# Patient Record
Sex: Female | Born: 1945 | Race: White | Hispanic: No | Marital: Married | Smoking: Never smoker
Health system: Southern US, Community
[De-identification: ages and names within clinical notes are randomized; demographics above are authoritative.]

## PROBLEM LIST (undated history)

## (undated) DIAGNOSIS — K219 Gastro-esophageal reflux disease without esophagitis: Secondary | ICD-10-CM

## (undated) DIAGNOSIS — C439 Malignant melanoma of skin, unspecified: Secondary | ICD-10-CM

## (undated) DIAGNOSIS — I10 Essential (primary) hypertension: Secondary | ICD-10-CM

## (undated) DIAGNOSIS — C801 Malignant (primary) neoplasm, unspecified: Secondary | ICD-10-CM

## (undated) DIAGNOSIS — J45909 Unspecified asthma, uncomplicated: Secondary | ICD-10-CM

## (undated) DIAGNOSIS — T7840XA Allergy, unspecified, initial encounter: Secondary | ICD-10-CM

## (undated) DIAGNOSIS — C50919 Malignant neoplasm of unspecified site of unspecified female breast: Secondary | ICD-10-CM

## (undated) DIAGNOSIS — I8390 Asymptomatic varicose veins of unspecified lower extremity: Secondary | ICD-10-CM

## (undated) DIAGNOSIS — E079 Disorder of thyroid, unspecified: Secondary | ICD-10-CM

## (undated) DIAGNOSIS — E119 Type 2 diabetes mellitus without complications: Secondary | ICD-10-CM

## (undated) HISTORY — PX: OTHER SURGICAL HISTORY: SHX169

## (undated) HISTORY — DX: Unspecified asthma, uncomplicated: J45.909

## (undated) HISTORY — PX: BIOPSY THYROID: PRO38

## (undated) HISTORY — DX: Allergy, unspecified, initial encounter: T78.40XA

## (undated) HISTORY — PX: MASTECTOMY: SHX3

## (undated) HISTORY — DX: Disorder of thyroid, unspecified: E07.9

## (undated) HISTORY — PX: TONSILLECTOMY: SUR1361

## (undated) HISTORY — PX: SKIN CANCER EXCISION: SHX779

## (undated) HISTORY — PX: MEDIASTINAL MASS EXCISION: SHX2025

## (undated) HISTORY — DX: Essential (primary) hypertension: I10

## (undated) HISTORY — DX: Gastro-esophageal reflux disease without esophagitis: K21.9

## (undated) HISTORY — PX: KNEE ARTHROSCOPY WITH MENISCAL REPAIR: SHX5653

## (undated) HISTORY — DX: Type 2 diabetes mellitus without complications: E11.9

## (undated) HISTORY — DX: Asymptomatic varicose veins of unspecified lower extremity: I83.90

## (undated) HISTORY — PX: BREAST LUMPECTOMY: SHX2

---

## 1974-09-20 DIAGNOSIS — C439 Malignant melanoma of skin, unspecified: Secondary | ICD-10-CM

## 1974-09-20 HISTORY — DX: Malignant melanoma of skin, unspecified: C43.9

## 2004-08-12 ENCOUNTER — Ambulatory Visit: Payer: Self-pay | Admitting: Family Medicine

## 2004-09-20 HISTORY — PX: COLONOSCOPY: SHX5424

## 2010-03-31 ENCOUNTER — Ambulatory Visit: Payer: Self-pay | Admitting: Family Medicine

## 2010-07-02 ENCOUNTER — Ambulatory Visit: Payer: Self-pay | Admitting: Family Medicine

## 2012-06-13 DIAGNOSIS — J383 Other diseases of vocal cords: Secondary | ICD-10-CM | POA: Insufficient documentation

## 2013-02-09 DIAGNOSIS — I890 Lymphedema, not elsewhere classified: Secondary | ICD-10-CM | POA: Insufficient documentation

## 2013-02-09 DIAGNOSIS — C50919 Malignant neoplasm of unspecified site of unspecified female breast: Secondary | ICD-10-CM | POA: Insufficient documentation

## 2013-03-01 DIAGNOSIS — R1110 Vomiting, unspecified: Secondary | ICD-10-CM | POA: Insufficient documentation

## 2013-03-01 DIAGNOSIS — E039 Hypothyroidism, unspecified: Secondary | ICD-10-CM | POA: Insufficient documentation

## 2013-03-01 DIAGNOSIS — R490 Dysphonia: Secondary | ICD-10-CM | POA: Insufficient documentation

## 2013-06-04 DIAGNOSIS — I10 Essential (primary) hypertension: Secondary | ICD-10-CM | POA: Insufficient documentation

## 2013-12-07 DIAGNOSIS — M898X9 Other specified disorders of bone, unspecified site: Secondary | ICD-10-CM | POA: Insufficient documentation

## 2014-09-23 DIAGNOSIS — K224 Dyskinesia of esophagus: Secondary | ICD-10-CM | POA: Insufficient documentation

## 2014-10-31 DIAGNOSIS — R05 Cough: Secondary | ICD-10-CM | POA: Insufficient documentation

## 2014-10-31 DIAGNOSIS — R053 Chronic cough: Secondary | ICD-10-CM | POA: Insufficient documentation

## 2014-12-30 DIAGNOSIS — J9859 Other diseases of mediastinum, not elsewhere classified: Secondary | ICD-10-CM | POA: Insufficient documentation

## 2015-04-10 ENCOUNTER — Other Ambulatory Visit: Payer: Self-pay | Admitting: Family Medicine

## 2015-04-12 ENCOUNTER — Other Ambulatory Visit: Payer: Self-pay | Admitting: Family Medicine

## 2015-04-12 DIAGNOSIS — E039 Hypothyroidism, unspecified: Secondary | ICD-10-CM

## 2015-05-04 ENCOUNTER — Other Ambulatory Visit: Payer: Self-pay | Admitting: Family Medicine

## 2015-05-04 DIAGNOSIS — E039 Hypothyroidism, unspecified: Secondary | ICD-10-CM

## 2015-05-29 DIAGNOSIS — J45991 Cough variant asthma: Secondary | ICD-10-CM | POA: Insufficient documentation

## 2015-07-02 ENCOUNTER — Other Ambulatory Visit: Payer: Self-pay | Admitting: Family Medicine

## 2015-07-02 DIAGNOSIS — E039 Hypothyroidism, unspecified: Secondary | ICD-10-CM

## 2015-07-03 ENCOUNTER — Other Ambulatory Visit: Payer: Self-pay | Admitting: Family Medicine

## 2015-07-30 ENCOUNTER — Other Ambulatory Visit: Payer: Self-pay | Admitting: Family Medicine

## 2015-09-28 ENCOUNTER — Other Ambulatory Visit: Payer: Self-pay | Admitting: Family Medicine

## 2015-10-05 ENCOUNTER — Other Ambulatory Visit: Payer: Self-pay | Admitting: Family Medicine

## 2015-11-01 ENCOUNTER — Other Ambulatory Visit: Payer: Self-pay | Admitting: Family Medicine

## 2015-11-06 ENCOUNTER — Ambulatory Visit (INDEPENDENT_AMBULATORY_CARE_PROVIDER_SITE_OTHER): Payer: Medicare Other | Admitting: Family Medicine

## 2015-11-06 ENCOUNTER — Encounter: Payer: Self-pay | Admitting: Family Medicine

## 2015-11-06 VITALS — BP 120/80 | HR 72 | Ht 63.0 in | Wt 214.0 lb

## 2015-11-06 DIAGNOSIS — R05 Cough: Secondary | ICD-10-CM | POA: Diagnosis not present

## 2015-11-06 DIAGNOSIS — I10 Essential (primary) hypertension: Secondary | ICD-10-CM

## 2015-11-06 DIAGNOSIS — R053 Chronic cough: Secondary | ICD-10-CM

## 2015-11-06 DIAGNOSIS — Z23 Encounter for immunization: Secondary | ICD-10-CM | POA: Diagnosis not present

## 2015-11-06 DIAGNOSIS — K219 Gastro-esophageal reflux disease without esophagitis: Secondary | ICD-10-CM | POA: Diagnosis not present

## 2015-11-06 DIAGNOSIS — E039 Hypothyroidism, unspecified: Secondary | ICD-10-CM

## 2015-11-06 MED ORDER — HYDROCHLOROTHIAZIDE 25 MG PO TABS: 25.0000 mg | ORAL_TABLET | Freq: Every day | ORAL | Status: DC

## 2015-11-06 MED ORDER — RANITIDINE HCL 150 MG PO TABS: 150.0000 mg | ORAL_TABLET | Freq: Two times a day (BID) | ORAL | Status: DC

## 2015-11-06 MED ORDER — LEVOTHYROXINE SODIUM 50 MCG PO TABS: 50.0000 ug | ORAL_TABLET | Freq: Every day | ORAL | Status: DC

## 2015-11-06 MED ORDER — BENZONATATE 100 MG PO CAPS: 100.0000 mg | ORAL_CAPSULE | Freq: Two times a day (BID) | ORAL | Status: DC

## 2015-11-06 NOTE — Progress Notes (Signed)
Name: Linda Vazquez   MRN: [id number]    DOB: [birth year]   Date:11/06/2015       Progress Note  Subjective  Chief Complaint  No chief complaint on file.   Gastroesophageal Reflux She complains of choking. She reports no abdominal pain, no belching, no chest pain, no coughing, no dysphagia, no early satiety, no globus sensation, no heartburn, no hoarse voice, no nausea, no sore throat, no stridor, no tooth decay, no water brash or no wheezing. This is a chronic problem. The current episode started more than 1 year ago. The problem occurs frequently. The problem has been waxing and waning. The symptoms are aggravated by certain foods. Pertinent negatives include no anemia, fatigue, melena, muscle weakness, orthopnea or weight loss. Risk factors include hiatal hernia. She has tried a PPI for the symptoms. The treatment provided mild relief. Past procedures include an EGD and esophageal manometry. Past invasive treatments do not include gastroplasty or reflux surgery.  Thyroid Problem Presents for follow-up visit. Patient reports no anxiety, cold intolerance, constipation, depressed mood, diaphoresis, diarrhea, dry skin, fatigue, hair loss, heat intolerance, hoarse voice, leg swelling, menstrual problem, nail problem, palpitations, tremors, visual change, weight gain or weight loss. The symptoms have been stable. Past treatments include levothyroxine. There is no history of atrial fibrillation, dementia, diabetes or heart failure.  Hypertension This is a chronic problem. The current episode started more than 1 year ago. The problem has been waxing and waning since onset. The problem is controlled. Pertinent negatives include no anxiety, blurred vision, chest pain, headaches, malaise/fatigue, neck pain, orthopnea, palpitations, peripheral edema, PND, shortness of breath or sweats. There are no associated agents to hypertension. The current treatment provides mild improvement. There are no compliance  problems.  Hypertensive end-organ damage includes a thyroid problem. There is no history of angina, kidney disease, CAD/MI, CVA, heart failure, left ventricular hypertrophy, PVD or retinopathy.    No problem-specific assessment & plan notes found for this encounter.   No past medical history on file.  Past Surgical History  Procedure Laterality Date  . Tonsillectomy    . Breast lumpectomy Right   . Mastectomy Left   . Mastectomy Right     2 yrs later  . Skin cancer excision      melanoma- stripped nodes on L) side  . Tubal cauterization    . Knee arthroscopy with meniscal repair Bilateral   . Mediastinal mass excision    . Biopsy thyroid      benign  . Colonoscopy  2006    repeat in 10 yrs- [facility]    No family history on file.  Social History   Social History  . Marital Status: Married    Spouse Name: N/A  . Number of Children: N/A  . Years of Education: N/A   Occupational History  . Not on file.   Social History Main Topics  . Smoking status: Never Smoker   . Smokeless tobacco: Not on file  . Alcohol Use: 0.0 oz/week    0 Standard drinks or equivalent per week  . Drug Use: No  . Sexual Activity: Not on file   Other Topics Concern  . Not on file   Social History Narrative  . No narrative on file    Allergies  Allergen Reactions  . Ace Inhibitors Cough    Per pt bp med caused cough possible ace inhibitor  . Sulfa Antibiotics Other (See Comments)    Other Reaction: Not Assessed  Review of Systems  Constitutional: Negative for fever, chills, weight loss, weight gain, malaise/fatigue, diaphoresis and fatigue.  HENT: Negative for ear discharge, ear pain, hoarse voice and sore throat.   Eyes: Negative for blurred vision.  Respiratory: Positive for choking. Negative for cough, sputum production, shortness of breath and wheezing.   Cardiovascular: Negative for chest pain, palpitations, orthopnea, leg swelling and PND.  Gastrointestinal: Negative for  heartburn, dysphagia, nausea, abdominal pain, diarrhea, constipation, blood in stool and melena.  Genitourinary: Negative for dysuria, urgency, frequency, hematuria and menstrual problem.  Musculoskeletal: Negative for myalgias, back pain, joint pain, muscle weakness and neck pain.  Skin: Negative for rash.  Neurological: Negative for dizziness, tingling, tremors, sensory change, focal weakness and headaches.  Endo/Heme/Allergies: Negative for environmental allergies, cold intolerance, heat intolerance and polydipsia. Does not bruise/bleed easily.  Psychiatric/Behavioral: Negative for depression and suicidal ideas. The patient is not nervous/anxious and does not have insomnia.      Objective  Filed Vitals:   11/06/15 0946  BP: 120/80  Pulse: 72  Height: 5\' 3"  (1.6 m)  Weight: 214 lb (97.07 kg)    Physical Exam  Constitutional: She is well-developed, well-nourished, and in no distress. No distress.  HENT:  Head: Normocephalic and atraumatic.  Right Ear: External ear normal.  Left Ear: External ear normal.  Nose: Nose normal.  Mouth/Throat: Oropharynx is clear and moist.  Eyes: Conjunctivae and EOM are normal. Pupils are equal, round, and reactive to light. Right eye exhibits no discharge. Left eye exhibits no discharge.  Neck: Normal range of motion. Neck supple. No JVD present. No thyromegaly present.  Cardiovascular: Normal rate, regular rhythm, normal heart sounds and intact distal pulses.  Exam reveals no gallop and no friction rub.   No murmur heard. Pulmonary/Chest: Effort normal and breath sounds normal.  Abdominal: Soft. Bowel sounds are normal. She exhibits no mass. There is no tenderness. There is no guarding.  Musculoskeletal: Normal range of motion. She exhibits no edema.  Lymphadenopathy:    She has no cervical adenopathy.  Neurological: She is alert. She has normal reflexes.  Skin: Skin is warm and dry. She is not diaphoretic.  Psychiatric: Mood and affect normal.   Nursing note and vitals reviewed.     Assessment & Plan  Problem List Items Addressed This Visit      Cardiovascular and Mediastinum   BP (high blood pressure) - Primary   Relevant Medications   aspirin EC 81 MG tablet   hydrochlorothiazide (HYDRODIURIL) 25 MG tablet   Other Relevant Orders   Renal Function Panel   Lipid Profile     Endocrine   Adult hypothyroidism   Relevant Medications   levothyroxine (SYNTHROID, LEVOTHROID) 50 MCG tablet   Other Relevant Orders   TSH     Other   Chronic cough   Relevant Medications   benzonatate (TESSALON) 100 MG capsule    Other Visit Diagnoses    Gastroesophageal reflux disease, esophagitis presence not specified        Relevant Medications    ranitidine (ZANTAC) 150 MG tablet    Need for Tdap vaccination        Relevant Orders    Tdap vaccine greater than or equal to 7yo IM (Completed)         Dr. [facility]  11/06/2015

## 2015-11-07 LAB — RENAL FUNCTION PANEL
Albumin: 4.4 g/dL (ref 3.5–4.8)
BUN/Creatinine Ratio: 21 (ref 11–26)
BUN: 23 mg/dL (ref 8–27)
CO2: 26 mmol/L (ref 18–29)
Calcium: 10 mg/dL (ref 8.7–10.3)
Chloride: 96 mmol/L (ref 96–106)
Creatinine, Ser: 1.12 mg/dL — ABNORMAL HIGH (ref 0.57–1.00)
GFR calc Af Amer: 57 mL/min/{1.73_m2} — ABNORMAL LOW (ref >59)
GFR calc non Af Amer: 50 mL/min/{1.73_m2} — ABNORMAL LOW (ref >59)
Glucose: 116 mg/dL — ABNORMAL HIGH (ref 65–99)
Phosphorus: 4.1 mg/dL (ref 2.5–4.5)
Potassium: 4.9 mmol/L (ref 3.5–5.2)
Sodium: 142 mmol/L (ref 134–144)

## 2015-11-07 LAB — LIPID PANEL
Chol/HDL Ratio: 2.5 ratio units (ref 0.0–4.4)
Cholesterol, Total: 211 mg/dL — ABNORMAL HIGH (ref 100–199)
HDL: 83 mg/dL (ref >39)
LDL Calculated: 112 mg/dL — ABNORMAL HIGH (ref 0–99)
Triglycerides: 80 mg/dL (ref 0–149)
VLDL Cholesterol Cal: 16 mg/dL (ref 5–40)

## 2015-11-07 LAB — TSH: TSH: 4.19 u[IU]/mL (ref 0.450–4.500)

## 2015-11-10 ENCOUNTER — Ambulatory Visit (INDEPENDENT_AMBULATORY_CARE_PROVIDER_SITE_OTHER): Payer: Medicare Other | Admitting: Family Medicine

## 2015-11-10 ENCOUNTER — Encounter: Payer: Self-pay | Admitting: Family Medicine

## 2015-11-10 VITALS — BP 130/74 | HR 80 | Ht 63.0 in | Wt 217.0 lb

## 2015-11-10 DIAGNOSIS — Z13820 Encounter for screening for osteoporosis: Secondary | ICD-10-CM | POA: Diagnosis not present

## 2015-11-10 DIAGNOSIS — E2839 Other primary ovarian failure: Secondary | ICD-10-CM

## 2015-11-10 DIAGNOSIS — Z0000 Encounter for general adult medical examination without abnormal findings: Secondary | ICD-10-CM

## 2015-11-10 DIAGNOSIS — Z1211 Encounter for screening for malignant neoplasm of colon: Secondary | ICD-10-CM

## 2015-11-10 LAB — HEMOCCULT GUIAC POC 1CARD (OFFICE): Fecal Occult Blood, POC: NEGATIVE

## 2015-11-10 NOTE — Patient Instructions (Signed)

## 2015-11-10 NOTE — Progress Notes (Signed)
Name: Linda Vazquez   MRN: [id number]    DOB: 08/22/1946   Date:11/10/2015       Progress Note  Subjective  Chief Complaint  Chief Complaint  Patient presents with  . Annual Exam    no pap    HPI Comments: Patient presents for annual physical exam.   No problem-specific assessment & plan notes found for this encounter.   Past Medical History  Diagnosis Date  . GERD (gastroesophageal reflux disease)   . Thyroid disease   . Hypertension   . Allergy     Past Surgical History  Procedure Laterality Date  . Tonsillectomy    . Breast lumpectomy Right   . Mastectomy Left   . Mastectomy Right     2 yrs later  . Skin cancer excision      melanoma- stripped nodes on L) side  . Tubal cauterization    . Knee arthroscopy with meniscal repair Bilateral   . Mediastinal mass excision    . Biopsy thyroid      benign  . Colonoscopy  2006    repeat in 10 yrs- [facility]    History reviewed. No pertinent family history.  Social History   Social History  . Marital Status: Married    Spouse Name: N/A  . Number of Children: N/A  . Years of Education: N/A   Occupational History  . Not on file.   Social History Main Topics  . Smoking status: Never Smoker   . Smokeless tobacco: Not on file  . Alcohol Use: 0.0 oz/week    0 Standard drinks or equivalent per week  . Drug Use: No  . Sexual Activity: Not Currently   Other Topics Concern  . Not on file   Social History Narrative    Allergies  Allergen Reactions  . Ace Inhibitors Cough    Per pt bp med caused cough possible ace inhibitor  . Sulfa Antibiotics Other (See Comments)    Other Reaction: Not Assessed     Review of Systems  Constitutional: Negative for fever, chills, weight loss and malaise/fatigue.  HENT: Negative for ear discharge, ear pain and sore throat.   Eyes: Negative for blurred vision.  Respiratory: Negative for cough, sputum production, shortness of breath and wheezing.   Cardiovascular: Negative  for chest pain, palpitations and leg swelling.  Gastrointestinal: Negative for heartburn, nausea, abdominal pain, diarrhea, constipation, blood in stool and melena.  Genitourinary: Negative for dysuria, urgency, frequency and hematuria.  Musculoskeletal: Negative for myalgias, back pain, joint pain and neck pain.  Skin: Negative for rash.  Neurological: Negative for dizziness, tingling, sensory change, focal weakness and headaches.  Endo/Heme/Allergies: Negative for environmental allergies and polydipsia. Does not bruise/bleed easily.  Psychiatric/Behavioral: Negative for depression and suicidal ideas. The patient is not nervous/anxious and does not have insomnia.      Objective  Filed Vitals:   11/10/15 0840  BP: 130/74  Pulse: 80  Height: 5\' 3"  (1.6 m)  Weight: 217 lb (98.431 kg)    Physical Exam  Constitutional: She is well-developed, well-nourished, and in no distress. No distress.  HENT:  Head: Normocephalic and atraumatic.  Right Ear: Tympanic membrane, external ear and ear canal normal.  Left Ear: Tympanic membrane, external ear and ear canal normal.  Nose: Nose normal.  Mouth/Throat: Oropharynx is clear and moist. No oropharyngeal exudate or posterior oropharyngeal edema.  Eyes: Conjunctivae and EOM are normal. Pupils are equal, round, and reactive to light. Right eye exhibits no discharge.  Left eye exhibits no discharge.  Fundoscopic exam:      The right eye shows no arteriolar narrowing, no AV nicking, no hemorrhage and no papilledema.       The left eye shows no arteriolar narrowing, no AV nicking, no hemorrhage and no papilledema.  Neck: Normal range of motion. Neck supple. No JVD present. No thyromegaly present.  Cardiovascular: Normal rate, regular rhythm, S1 normal, S2 normal, normal heart sounds and intact distal pulses.  Exam reveals no gallop, no S3, no S4 and no friction rub.   No murmur heard. Pulmonary/Chest: Effort normal and breath sounds normal. Right  breast exhibits skin change. Left breast exhibits skin change. Breasts are asymmetrical.  Scarring bilat mastectomy  Abdominal: Soft. Bowel sounds are normal. She exhibits no mass. There is no hepatosplenomegaly. There is no tenderness. There is no guarding and no CVA tenderness.  Genitourinary: Rectum normal, uterus normal, right adnexa normal, left adnexa normal and vulva normal.  Musculoskeletal: Normal range of motion. She exhibits no edema.  Lymphadenopathy:    She has no cervical adenopathy.  Neurological: She is alert. She has normal sensation, normal strength, normal reflexes and intact cranial nerves.  Skin: Skin is warm and dry. She is not diaphoretic.  Psychiatric: Mood and affect normal.  Nursing note and vitals reviewed.     Assessment & Plan  Problem List Items Addressed This Visit    None    Visit Diagnoses    Annual physical exam    -  Primary    Colon cancer screening        Relevant Orders    POCT Occult Blood Stool (Completed)    Osteoporosis screening        Relevant Orders    DG Bone Density    Menopause ovarian failure        Relevant Orders    DG Bone Density         Dr. Otilio Mi[facility]  11/10/2015

## 2015-11-11 ENCOUNTER — Ambulatory Visit
Admission: RE | Admit: 2015-11-11 | Discharge: 2015-11-11 | Disposition: A | Discharge status: Home / Self Care | Payer: Medicare Other | Source: Ambulatory Visit | Attending: Family Medicine | Admitting: Family Medicine

## 2015-11-11 DIAGNOSIS — E2839 Other primary ovarian failure: Secondary | ICD-10-CM | POA: Insufficient documentation

## 2015-11-11 DIAGNOSIS — Z13820 Encounter for screening for osteoporosis: Secondary | ICD-10-CM | POA: Insufficient documentation

## 2015-11-11 HISTORY — DX: Malignant (primary) neoplasm, unspecified: C80.1

## 2015-11-11 HISTORY — DX: Malignant melanoma of skin, unspecified: C43.9

## 2015-11-11 HISTORY — DX: Malignant neoplasm of unspecified site of unspecified female breast: C50.919

## 2015-11-17 ENCOUNTER — Other Ambulatory Visit: Payer: Self-pay

## 2015-11-17 ENCOUNTER — Ambulatory Visit: Payer: Medicare Other | Admitting: Family Medicine

## 2015-11-17 MED ORDER — AZITHROMYCIN 250 MG PO TABS: ORAL_TABLET | ORAL | Status: DC

## 2015-11-26 ENCOUNTER — Other Ambulatory Visit: Payer: Self-pay | Admitting: Family Medicine

## 2016-03-25 ENCOUNTER — Other Ambulatory Visit: Payer: Self-pay | Admitting: Family Medicine

## 2016-04-19 DIAGNOSIS — C4492 Squamous cell carcinoma of skin, unspecified: Secondary | ICD-10-CM

## 2016-04-19 HISTORY — DX: Squamous cell carcinoma of skin, unspecified: C44.92

## 2016-05-05 ENCOUNTER — Other Ambulatory Visit: Payer: Self-pay | Admitting: Family Medicine

## 2016-05-05 DIAGNOSIS — I10 Essential (primary) hypertension: Secondary | ICD-10-CM

## 2016-08-10 ENCOUNTER — Other Ambulatory Visit: Payer: Self-pay

## 2016-08-17 ENCOUNTER — Other Ambulatory Visit: Payer: Self-pay | Admitting: Family Medicine

## 2016-08-17 DIAGNOSIS — I10 Essential (primary) hypertension: Secondary | ICD-10-CM

## 2016-09-17 ENCOUNTER — Other Ambulatory Visit: Payer: Self-pay | Admitting: Family Medicine

## 2016-09-19 ENCOUNTER — Other Ambulatory Visit: Payer: Self-pay | Admitting: Family Medicine

## 2016-09-19 DIAGNOSIS — I10 Essential (primary) hypertension: Secondary | ICD-10-CM

## 2016-09-20 ENCOUNTER — Other Ambulatory Visit: Payer: Self-pay | Admitting: Family Medicine

## 2016-09-20 DIAGNOSIS — K219 Gastro-esophageal reflux disease without esophagitis: Secondary | ICD-10-CM

## 2016-10-15 ENCOUNTER — Other Ambulatory Visit: Payer: Self-pay | Admitting: Family Medicine

## 2016-10-15 DIAGNOSIS — I10 Essential (primary) hypertension: Secondary | ICD-10-CM

## 2016-10-16 ENCOUNTER — Other Ambulatory Visit: Payer: Self-pay | Admitting: Family Medicine

## 2016-11-10 ENCOUNTER — Ambulatory Visit (INDEPENDENT_AMBULATORY_CARE_PROVIDER_SITE_OTHER): Payer: Medicare Other | Admitting: Family Medicine

## 2016-11-10 ENCOUNTER — Encounter: Payer: Self-pay | Admitting: Family Medicine

## 2016-11-10 ENCOUNTER — Other Ambulatory Visit: Payer: Self-pay | Admitting: Family Medicine

## 2016-11-10 VITALS — BP 112/80 | HR 60 | Ht 63.0 in | Wt 202.0 lb

## 2016-11-10 DIAGNOSIS — J45991 Cough variant asthma: Secondary | ICD-10-CM

## 2016-11-10 DIAGNOSIS — Z0000 Encounter for general adult medical examination without abnormal findings: Secondary | ICD-10-CM | POA: Diagnosis not present

## 2016-11-10 DIAGNOSIS — C50911 Malignant neoplasm of unspecified site of right female breast: Secondary | ICD-10-CM

## 2016-11-10 DIAGNOSIS — Z170 Estrogen receptor positive status [ER+]: Secondary | ICD-10-CM

## 2016-11-10 DIAGNOSIS — Z23 Encounter for immunization: Secondary | ICD-10-CM | POA: Diagnosis not present

## 2016-11-10 DIAGNOSIS — I10 Essential (primary) hypertension: Secondary | ICD-10-CM

## 2016-11-10 DIAGNOSIS — E039 Hypothyroidism, unspecified: Secondary | ICD-10-CM | POA: Diagnosis not present

## 2016-11-10 DIAGNOSIS — R053 Chronic cough: Secondary | ICD-10-CM

## 2016-11-10 DIAGNOSIS — R05 Cough: Secondary | ICD-10-CM | POA: Diagnosis not present

## 2016-11-10 DIAGNOSIS — C50912 Malignant neoplasm of unspecified site of left female breast: Secondary | ICD-10-CM

## 2016-11-10 DIAGNOSIS — J301 Allergic rhinitis due to pollen: Secondary | ICD-10-CM

## 2016-11-10 MED ORDER — LEVOTHYROXINE SODIUM 50 MCG PO TABS: 50.0000 ug | ORAL_TABLET | Freq: Every day | ORAL | 1 refills | Status: DC

## 2016-11-10 MED ORDER — HYDROCHLOROTHIAZIDE 25 MG PO TABS: 25.0000 mg | ORAL_TABLET | Freq: Every day | ORAL | 1 refills | Status: DC

## 2016-11-10 MED ORDER — MONTELUKAST SODIUM 10 MG PO TABS: 10.0000 mg | ORAL_TABLET | Freq: Every day | ORAL | 3 refills | Status: DC

## 2016-11-10 MED ORDER — BENZONATATE 100 MG PO CAPS: 100.0000 mg | ORAL_CAPSULE | Freq: Two times a day (BID) | ORAL | 5 refills | Status: DC

## 2016-11-10 NOTE — Progress Notes (Signed)
Name: Linda Vazquez   MRN: [id number]    DOB: 05-Nov-1945   Date:11/10/2016       Progress Note  Subjective  Chief Complaint  Chief Complaint  Patient presents with  . Annual Exam  . Hypothyroidism  . Hypertension  . Cough    prescribe tessalon perles to control cough    Patient presents for annual physical exam.   Hypertension  This is a chronic problem. The current episode started more than 1 year ago. The problem is unchanged. The problem is controlled. Pertinent negatives include no anxiety, blurred vision, chest pain, headaches, malaise/fatigue, neck pain, orthopnea, palpitations, peripheral edema, PND, shortness of breath or sweats. There are no associated agents to hypertension. There are no known risk factors for coronary artery disease. Past treatments include diuretics. The current treatment provides moderate improvement. There are no compliance problems.  There is no history of angina, kidney disease, CAD/MI, CVA, heart failure, left ventricular hypertrophy, PVD or renovascular disease. Identifiable causes of hypertension include a thyroid problem. There is no history of chronic renal disease or a hypertension causing med.  Cough  This is a chronic problem. The current episode started more than 1 year ago. The problem has been waxing and waning. The cough is non-productive. Associated symptoms include postnasal drip. Pertinent negatives include no chest pain, chills, ear congestion, ear pain, fever, headaches, heartburn, hemoptysis, myalgias, nasal congestion, rash, rhinorrhea, sore throat, shortness of breath, sweats, weight loss or wheezing. The symptoms are aggravated by pollens. She has tried a beta-agonist inhaler for the symptoms. The treatment provided mild relief. Her past medical history is significant for asthma and environmental allergies.  Thyroid Problem  Presents for follow-up visit. Symptoms include dry skin, hair loss and weight gain. Patient reports no anxiety,  cold intolerance, constipation, depressed mood, diaphoresis, diarrhea, fatigue, heat intolerance, hoarse voice, leg swelling, menstrual problem, nail problem, palpitations, tremors, visual change or weight loss. The symptoms have been stable. There is no history of heart failure.    No problem-specific Assessment & Plan notes found for this encounter.   Past Medical History:  Diagnosis Date  . Allergy   . Breast cancer ([place])   . Cancer ([place])   . GERD (gastroesophageal reflux disease)   . Hypertension   . Melanoma ([place])   . Thyroid disease     Past Surgical History:  Procedure Laterality Date  . BIOPSY THYROID     benign  . BREAST LUMPECTOMY Right   . COLONOSCOPY  2006   repeat in 10 yrs- [facility] Dr  . KNEE ARTHROSCOPY WITH MENISCAL REPAIR Bilateral   . MASTECTOMY Left   . MASTECTOMY Right    2 yrs later  . MEDIASTINAL MASS EXCISION    . 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 TONSILLECTOMY    . tubal cauterization      No family history on file.  Social History   Social History  . Marital status: Married    Spouse name: N/A  . Number of children: N/A  . Years of education: N/A   Occupational History  . Not on file.   Social History Main Topics  . Smoking status: Never Smoker  . Smokeless tobacco: Never Used  . Alcohol use 0.0 oz/week  . Drug use: No  . Sexual activity: Not Currently   Other Topics Concern  . Not on file   Social History Narrative  . No narrative on file  Allergies  Allergen Reactions  . Ace Inhibitors Cough    Per pt bp med caused cough possible ace inhibitor  . Sulfa Antibiotics Other (See Comments)    Other Reaction: Not Assessed    Outpatient Medications Prior to Visit  Medication Sig Dispense Refill  . aspirin EC 81 MG tablet Take 1 tablet by mouth daily.    . Biotin 1000 MCG tablet Take 1 tablet by mouth daily. OTC    . cetirizine-pseudoephedrine (ZYRTEC-D) 5-120 MG tablet Take 1 tablet by mouth  daily. OTC    . clonazePAM (KLONOPIN) 0.5 MG tablet [facility]. DR    . mometasone-formoterol (DULERA) 200-5 MCG/ACT AERO Inhale into the lungs. [facility]    . ranitidine (ZANTAC) 150 MG tablet TAKE 1 TABLET BY MOUTH TWICE A DAY 15 tablet 0  . benzonatate (TESSALON) 100 MG capsule Take 1 capsule (100 mg total) by mouth 2 (two) times daily. PRN- 30 capsule 5  . hydrochlorothiazide (HYDRODIURIL) 25 MG tablet TAKE 1 TABLET BY MOUTH EVERY DAY 7 tablet 0  . levothyroxine (SYNTHROID, LEVOTHROID) 50 MCG tablet Take 1 tablet (50 mcg total) by mouth daily. 90 tablet 1  . amitriptyline (ELAVIL) 10 MG tablet Take 1 tablet by mouth at bedtime. Dr Rodolph Bong    . azithromycin (ZITHROMAX) 250 MG tablet Use as directed 6 tablet 0  . levothyroxine (SYNTHROID, LEVOTHROID) 50 MCG tablet TAKE 1 TABLET BY MOUTH DAILY**NEEDS OFFICE VISIT** 15 tablet 0  . levothyroxine (SYNTHROID, LEVOTHROID) 50 MCG tablet TAKE 1 TABLET BY MOUTH DAILY**NEEDS OFFICE VISIT** 15 tablet 0   No facility-administered medications prior to visit.     Review of Systems  Constitutional: Positive for weight gain. Negative for chills, diaphoresis, fatigue, fever, malaise/fatigue and weight loss.  HENT: Positive for postnasal drip. Negative for ear pain, hoarse voice, rhinorrhea and sore throat.   Eyes: Negative for blurred vision.  Respiratory: Positive for cough. Negative for hemoptysis, shortness of breath and wheezing.   Cardiovascular: Negative for chest pain, palpitations, orthopnea and PND.  Gastrointestinal: Negative for constipation, diarrhea and heartburn.  Genitourinary: Negative for menstrual problem.  Musculoskeletal: Negative for myalgias and neck pain.  Skin: Negative for rash.  Neurological: Negative for tremors and headaches.  Endo/Heme/Allergies: Positive for environmental allergies. Negative for cold intolerance and heat intolerance.  Psychiatric/Behavioral: The patient is not nervous/anxious.      Objective  Vitals:    11/10/16 0938  BP: 112/80  Pulse: 60  Weight: 202 lb (91.6 kg)  Height: 5\' 3"  (1.6 m)    Physical Exam  Constitutional: She is well-developed, well-nourished, and in no distress. No distress.  HENT:  Head: Normocephalic and atraumatic.  Right Ear: Tympanic membrane, external ear and ear canal normal.  Left Ear: Tympanic membrane, external ear and ear canal normal.  Nose: Nose normal.  Mouth/Throat: Oropharynx is clear and moist.  Eyes: Conjunctivae, EOM and lids are normal. Pupils are equal, round, and reactive to light. Right eye exhibits no discharge. Left eye exhibits no discharge.  Fundoscopic exam:      The right eye shows no arteriolar narrowing and no AV nicking.       The left eye shows no arteriolar narrowing and no AV nicking.  Neck: Trachea normal and normal range of motion. Neck supple. Normal carotid pulses, no hepatojugular reflux and no JVD present. Carotid bruit is not present. No thyroid mass and no thyromegaly present.  Cardiovascular: Normal rate, regular rhythm, S1 normal, S2 normal, normal heart sounds, intact distal pulses  and normal pulses.  PMI is not displaced.  Exam reveals no gallop, no S3, no S4 and no friction rub.   No murmur heard. Pulmonary/Chest: Effort normal and breath sounds normal. No accessory muscle usage. No respiratory distress. She has no decreased breath sounds. She has no wheezes. She has no rales. Right breast exhibits no mass. Left breast exhibits no mass.  Bilateral mastectomy  Abdominal: Soft. Normal aorta and bowel sounds are normal. She exhibits no mass. There is no hepatosplenomegaly. There is no tenderness. There is no guarding and no CVA tenderness.  Musculoskeletal: Normal range of motion. She exhibits no edema.  Lymphadenopathy:       Head (right side): No submental and no submandibular adenopathy present.       Head (left side): No submental and no submandibular adenopathy present.    She has no cervical adenopathy.   Neurological: She is alert. She has normal sensation, normal strength, normal reflexes and intact cranial nerves.  Skin: Skin is warm and dry. She is not diaphoretic.  Psychiatric: Mood and affect normal.  Nursing note and vitals reviewed.     Assessment & Plan  Problem List Items Addressed This Visit      Cardiovascular and Mediastinum   BP (high blood pressure)   Relevant Medications   hydrochlorothiazide (HYDRODIURIL) 25 MG tablet   Other Relevant Orders   Renal Function Panel   Lipid Profile     Respiratory   Asthma, cough variant   Relevant Medications   montelukast (SINGULAIR) 10 MG tablet     Endocrine   Adult hypothyroidism   Relevant Medications   levothyroxine (SYNTHROID, LEVOTHROID) 50 MCG tablet   Other Relevant Orders   TSH     Other   Malignant neoplasm of breast (HCC)   Chronic cough   Relevant Medications   benzonatate (TESSALON) 100 MG capsule    Other Visit Diagnoses    Annual physical exam    -  Primary   Relevant Orders   Lipid Profile   Chronic allergic rhinitis due to pollen, unspecified seasonality       Relevant Medications   montelukast (SINGULAIR) 10 MG tablet   Need for pneumococcal vaccination       Relevant Orders   Pneumococcal conjugate vaccine 13-valent (Completed)      Meds ordered this encounter  Medications  . benzonatate (TESSALON) 100 MG capsule    Sig: Take 1 capsule (100 mg total) by mouth 2 (two) times daily. PRN-    Dispense:  30 capsule    Refill:  5  . hydrochlorothiazide (HYDRODIURIL) 25 MG tablet    Sig: Take 1 tablet (25 mg total) by mouth daily.    Dispense:  90 tablet    Refill:  1  . levothyroxine (SYNTHROID, LEVOTHROID) 50 MCG tablet    Sig: Take 1 tablet (50 mcg total) by mouth daily.    Dispense:  90 tablet    Refill:  1  . montelukast (SINGULAIR) 10 MG tablet    Sig: Take 1 tablet (10 mg total) by mouth at bedtime.    Dispense:  30 tablet    Refill:  3      Dr. [facility]  11/10/16

## 2016-11-11 LAB — LIPID PANEL
Chol/HDL Ratio: 2.4 (ref 0.0–4.4)
Cholesterol, Total: 182 mg/dL (ref 100–199)
HDL: 75 mg/dL
LDL Calculated: 92 (ref 0–99)
Triglycerides: 76 mg/dL (ref 0–149)
VLDL Cholesterol Cal: 15 (ref 5–40)

## 2016-11-11 LAB — TSH: TSH: 3.24 u[IU]/mL (ref 0.450–4.500)

## 2016-11-11 LAB — RENAL FUNCTION PANEL
Albumin: 4.2 g/dL (ref 3.5–4.8)
BUN/Creatinine Ratio: 21 (ref 12–28)
BUN: 17 mg/dL (ref 8–27)
CO2: 25 mmol/L (ref 18–29)
Calcium: 9.2 mg/dL (ref 8.7–10.3)
Chloride: 103 mmol/L (ref 96–106)
Creatinine, Ser: 0.8 mg/dL (ref 0.57–1.00)
GFR calc Af Amer: 85
GFR calc non Af Amer: 74
Glucose: 104 mg/dL — ABNORMAL HIGH (ref 65–99)
Phosphorus: 3.6 mg/dL (ref 2.5–4.5)
Potassium: 4.9 mmol/L (ref 3.5–5.2)
Sodium: 144 mmol/L (ref 134–144)

## 2017-01-30 ENCOUNTER — Other Ambulatory Visit: Payer: Self-pay | Admitting: Family Medicine

## 2017-01-30 DIAGNOSIS — K219 Gastro-esophageal reflux disease without esophagitis: Secondary | ICD-10-CM

## 2017-04-28 ENCOUNTER — Other Ambulatory Visit: Payer: Self-pay | Admitting: Family Medicine

## 2017-04-28 DIAGNOSIS — I10 Essential (primary) hypertension: Secondary | ICD-10-CM

## 2017-04-28 DIAGNOSIS — K219 Gastro-esophageal reflux disease without esophagitis: Secondary | ICD-10-CM

## 2017-05-07 ENCOUNTER — Other Ambulatory Visit: Payer: Self-pay | Admitting: Family Medicine

## 2017-05-07 DIAGNOSIS — E039 Hypothyroidism, unspecified: Secondary | ICD-10-CM

## 2017-05-26 ENCOUNTER — Other Ambulatory Visit: Payer: Self-pay | Admitting: Family Medicine

## 2017-05-26 DIAGNOSIS — K219 Gastro-esophageal reflux disease without esophagitis: Secondary | ICD-10-CM

## 2017-05-26 DIAGNOSIS — I10 Essential (primary) hypertension: Secondary | ICD-10-CM

## 2017-06-22 ENCOUNTER — Other Ambulatory Visit: Payer: Self-pay | Admitting: Family Medicine

## 2017-06-22 DIAGNOSIS — R05 Cough: Secondary | ICD-10-CM

## 2017-06-22 DIAGNOSIS — R053 Chronic cough: Secondary | ICD-10-CM

## 2017-07-13 ENCOUNTER — Other Ambulatory Visit: Payer: Self-pay | Admitting: Family Medicine

## 2017-07-13 DIAGNOSIS — I10 Essential (primary) hypertension: Secondary | ICD-10-CM

## 2017-08-10 ENCOUNTER — Other Ambulatory Visit: Payer: Self-pay

## 2017-08-15 ENCOUNTER — Other Ambulatory Visit: Payer: Self-pay | Admitting: Family Medicine

## 2017-08-29 DIAGNOSIS — Z1379 Encounter for other screening for genetic and chromosomal anomalies: Secondary | ICD-10-CM | POA: Insufficient documentation

## 2017-10-13 ENCOUNTER — Other Ambulatory Visit: Payer: Self-pay

## 2017-10-17 ENCOUNTER — Encounter: Payer: Self-pay | Admitting: Family Medicine

## 2017-10-17 ENCOUNTER — Encounter: Payer: Medicare Other | Admitting: Family Medicine

## 2017-10-17 ENCOUNTER — Ambulatory Visit: Payer: Medicare Other | Admitting: Family Medicine

## 2017-10-17 VITALS — BP 124/70 | HR 76 | Ht 63.0 in | Wt 207.0 lb

## 2017-10-17 DIAGNOSIS — E039 Hypothyroidism, unspecified: Secondary | ICD-10-CM | POA: Diagnosis not present

## 2017-10-17 DIAGNOSIS — E785 Hyperlipidemia, unspecified: Secondary | ICD-10-CM

## 2017-10-17 DIAGNOSIS — I10 Essential (primary) hypertension: Secondary | ICD-10-CM

## 2017-10-17 DIAGNOSIS — Z1159 Encounter for screening for other viral diseases: Secondary | ICD-10-CM | POA: Diagnosis not present

## 2017-10-17 DIAGNOSIS — R05 Cough: Secondary | ICD-10-CM | POA: Diagnosis not present

## 2017-10-17 DIAGNOSIS — K219 Gastro-esophageal reflux disease without esophagitis: Secondary | ICD-10-CM | POA: Diagnosis not present

## 2017-10-17 DIAGNOSIS — R053 Chronic cough: Secondary | ICD-10-CM

## 2017-10-17 DIAGNOSIS — J45991 Cough variant asthma: Secondary | ICD-10-CM

## 2017-10-17 DIAGNOSIS — R490 Dysphonia: Secondary | ICD-10-CM | POA: Diagnosis not present

## 2017-10-17 DIAGNOSIS — J301 Allergic rhinitis due to pollen: Secondary | ICD-10-CM | POA: Diagnosis not present

## 2017-10-17 MED ORDER — RANITIDINE HCL 150 MG PO TABS: 150.0000 mg | ORAL_TABLET | Freq: Two times a day (BID) | ORAL | 3 refills | Status: DC

## 2017-10-17 MED ORDER — MONTELUKAST SODIUM 10 MG PO TABS: 10.0000 mg | ORAL_TABLET | Freq: Every day | ORAL | 3 refills | Status: DC

## 2017-10-17 MED ORDER — HYDROCHLOROTHIAZIDE 25 MG PO TABS: 25.0000 mg | ORAL_TABLET | Freq: Every day | ORAL | 3 refills | Status: DC

## 2017-10-17 MED ORDER — LEVOTHYROXINE SODIUM 50 MCG PO TABS: 50.0000 ug | ORAL_TABLET | Freq: Every day | ORAL | 3 refills | Status: DC

## 2017-10-17 MED ORDER — BENZONATATE 100 MG PO CAPS: ORAL_CAPSULE | ORAL | 3 refills | Status: DC

## 2017-10-17 NOTE — Progress Notes (Signed)
Name: Linda Vazquez   MRN: [id number]    DOB: 09-28-1945   Date:10/17/2017       Progress Note  Subjective  Chief Complaint  Chief Complaint  Patient presents with  . Gastroesophageal Reflux  . Hypothyroidism  . Hypertension    Gastroesophageal Reflux  She reports no abdominal pain, no belching, no chest pain, no choking, no coughing, no dysphagia, no early satiety, no globus sensation, no heartburn, no hoarse voice, no nausea, no sore throat, no stridor, no tooth decay, no water brash or no wheezing. hoarse. This is a chronic problem. The current episode started more than 1 year ago. The problem occurs frequently. The problem has been unchanged. The symptoms are aggravated by certain foods (tomatoes). Pertinent negatives include no anemia, fatigue, melena, muscle weakness, orthopnea or weight loss. She has tried a histamine-2 antagonist for the symptoms. The treatment provided moderate relief.  Hypertension  This is a chronic problem. The current episode started more than 1 year ago. The problem is unchanged. The problem is controlled. Associated symptoms include anxiety. Pertinent negatives include no blurred vision, chest pain, headaches, malaise/fatigue, neck pain, orthopnea, palpitations, peripheral edema, PND, shortness of breath or sweats. There are no associated agents to hypertension. Risk factors for coronary artery disease include obesity, dyslipidemia and diabetes mellitus. Past treatments include diuretics. The current treatment provides moderate improvement. There are no compliance problems.  There is no history of angina, kidney disease, CAD/MI, CVA, heart failure, left ventricular hypertrophy, PVD or retinopathy. Identifiable causes of hypertension include a thyroid problem. There is no history of chronic renal disease, a hypertension causing med or renovascular disease.  Thyroid Problem  Presents for follow-up visit. Symptoms include anxiety and tremors. Patient reports no cold  intolerance, constipation, depressed mood, diaphoresis, diarrhea, dry skin, fatigue, hair loss, heat intolerance, hoarse voice, leg swelling, nail problem, palpitations, visual change, weight gain or weight loss. The symptoms have been stable. There is no history of heart failure.  Neurologic Problem  The patient's pertinent negatives include no altered mental status, clumsiness, focal sensory loss, focal weakness, loss of balance, memory loss, near-syncope, slurred speech, syncope, visual change or weakness. Primary symptoms comment: hoarse. This is a chronic problem. The neurological problem developed gradually. The problem is unchanged. Pertinent negatives include no abdominal pain, back pain, chest pain, diaphoresis, dizziness, fatigue, fever, headaches, nausea, neck pain, palpitations or shortness of breath.    No problem-specific Assessment & Plan notes found for this encounter.   Past Medical History:  Diagnosis Date  . Allergy   . Breast cancer ([place])   . Cancer ([place])   . GERD (gastroesophageal reflux disease)   . Hypertension   . Melanoma ([place])   . Thyroid disease     Past Surgical History:  Procedure Laterality Date  . BIOPSY THYROID     benign  . BREAST LUMPECTOMY Right   . COLONOSCOPY  2006   repeat in 10 yrs- [facility]  . KNEE ARTHROSCOPY WITH MENISCAL REPAIR Bilateral   . MASTECTOMY Left   . MASTECTOMY Right    2 yrs later  . MEDIASTINAL MASS EXCISION    . 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 TONSILLECTOMY    . tubal cauterization      History reviewed. No pertinent family history.  Social History   Socioeconomic History  . Marital status: Married    Spouse name: Not on file  . Number of children: Not on file  .  Years of education: Not on file  . Highest education level: Not on file  Social Needs  . Financial resource strain: Not on file  . Food insecurity - worry: Not on file  . Food insecurity - inability: Not on file  .  Transportation needs - medical: Not on file  . Transportation needs - non-medical: Not on file  Occupational History  . Not on file  Tobacco Use  . Smoking status: Never Smoker  . Smokeless tobacco: Never Used  Substance and Sexual Activity  . Alcohol use: Yes    Alcohol/week: 0.0 oz  . Drug use: No  . Sexual activity: Not Currently  Other Topics Concern  . Not on file  Social History Narrative  . Not on file    Allergies  Allergen Reactions  . Ace Inhibitors Cough    Per pt bp med caused cough possible ace inhibitor  . Sulfa Antibiotics Other (See Comments)    Other Reaction: Not Assessed    Outpatient Medications Prior to Visit  Medication Sig Dispense Refill  . amitriptyline (ELAVIL) 10 MG tablet Take 1 tablet by mouth at bedtime. Dr Rodolph Bong    . aspirin EC 81 MG tablet Take 1 tablet by mouth daily.    Marland Kitchen azelastine (ASTELIN) 0.1 % nasal spray Place 1 spray into the nose 2 (two) times daily. [facility]    . cetirizine-pseudoephedrine (ZYRTEC-D) 5-120 MG tablet Take 1 tablet by mouth daily. OTC    . clonazePAM (KLONOPIN) 0.5 MG tablet Otolaryn. DR Patrice Paradise    . mometasone-formoterol (DULERA) 200-5 MCG/ACT AERO Inhale into the lungs. [facility]    . benzonatate (TESSALON) 100 MG capsule TAKE 1 CAPSULE (100 MG TOTAL) BY MOUTH 2 (TWO) TIMES DAILY AS NEEDED (Patient taking differently: TAKE 1 CAPSULE (100 MG TOTAL) BY MOUTH 2 (TWO) TIMES DAILY AS NEEDED- [facility]) 30 capsule 5  . hydrochlorothiazide (HYDRODIURIL) 25 MG tablet TAKE 1 TABLET BY MOUTH EVERY DAY 30 tablet 0  . levothyroxine (SYNTHROID, LEVOTHROID) 50 MCG tablet TAKE 1 TABLET (50 MCG TOTAL) BY MOUTH DAILY. 90 tablet 1  . montelukast (SINGULAIR) 10 MG tablet Take 1 tablet (10 mg total) by mouth at bedtime. 30 tablet 3  . ranitidine (ZANTAC) 150 MG tablet TAKE 1 TABLET BY MOUTH TWICE A DAY 15 tablet 0  . Biotin 1000 MCG tablet Take 1 tablet by mouth daily. OTC    . ranitidine (ZANTAC) 150 MG tablet TAKE 1 TABLET BY MOUTH  TWICE A DAY 60 tablet 0   No facility-administered medications prior to visit.     Review of Systems  Constitutional: Negative for chills, diaphoresis, fatigue, fever, malaise/fatigue, weight gain and weight loss.  HENT: Negative for ear discharge, ear pain, hoarse voice and sore throat.   Eyes: Negative for blurred vision.  Respiratory: Negative for cough, sputum production, choking, shortness of breath and wheezing.   Cardiovascular: Negative for chest pain, palpitations, orthopnea, leg swelling, PND and near-syncope.  Gastrointestinal: Negative for abdominal pain, blood in stool, constipation, diarrhea, dysphagia, heartburn, melena and nausea.  Genitourinary: Negative for dysuria, frequency, hematuria and urgency.  Musculoskeletal: Negative for back pain, joint pain, myalgias, muscle weakness and neck pain.  Skin: Negative for rash.  Neurological: Positive for tremors. Negative for dizziness, tingling, sensory change, focal weakness, syncope, weakness, headaches and loss of balance.       Hoarse  Endo/Heme/Allergies: Negative for environmental allergies, cold intolerance, heat intolerance and polydipsia. Does not bruise/bleed easily.  Psychiatric/Behavioral: Negative for depression,  memory loss and suicidal ideas. The patient is nervous/anxious. The patient does not have insomnia.      Objective  Vitals:   10/17/17 0815  BP: 124/70  Pulse: 76  Weight: 207 lb (93.9 kg)  Height: 5\' 3"  (1.6 m)    Physical Exam  Constitutional: She is well-developed, well-nourished, and in no distress. No distress.  HENT:  Head: Normocephalic and atraumatic.  Right Ear: External ear normal.  Left Ear: External ear normal.  Nose: Nose normal.  Mouth/Throat: Oropharynx is clear and moist.  Eyes: Conjunctivae and EOM are normal. Pupils are equal, round, and reactive to light. Right eye exhibits no discharge. Left eye exhibits no discharge.  Neck: Normal range of motion. Neck supple. No JVD  present. No thyromegaly present.  Cardiovascular: Normal rate, regular rhythm, normal heart sounds and intact distal pulses. Exam reveals no gallop and no friction rub.  No murmur heard. Pulmonary/Chest: Effort normal and breath sounds normal. She has no wheezes. She has no rales.  Abdominal: Soft. Bowel sounds are normal. She exhibits no mass. There is no tenderness. There is no guarding.  Musculoskeletal: Normal range of motion. She exhibits no edema.  Lymphadenopathy:    She has no cervical adenopathy.  Neurological: She is alert. She has normal reflexes.  Skin: Skin is warm and dry. She is not diaphoretic.  Psychiatric: Mood and affect normal.  Nursing note and vitals reviewed.     Assessment & Plan  Problem List Items Addressed This Visit      Cardiovascular and Mediastinum   BP (high blood pressure) - Primary   Relevant Medications   hydrochlorothiazide (HYDRODIURIL) 25 MG tablet   Other Relevant Orders   Comprehensive metabolic panel     Respiratory   Asthma, cough variant   Relevant Medications   montelukast (SINGULAIR) 10 MG tablet     Endocrine   Adult hypothyroidism   Relevant Medications   levothyroxine (SYNTHROID, LEVOTHROID) 50 MCG tablet   Other Relevant Orders   TSH     Other   Chronic cough   Relevant Medications   azelastine (ASTELIN) 0.1 % nasal spray   benzonatate (TESSALON) 100 MG capsule   Dysphonia   Relevant Medications   benzonatate (TESSALON) 100 MG capsule   ranitidine (ZANTAC) 150 MG tablet    Other Visit Diagnoses    Gastroesophageal reflux disease, esophagitis presence not specified       Relevant Medications   ranitidine (ZANTAC) 150 MG tablet   ranitidine (ZANTAC) 150 MG tablet   Chronic allergic rhinitis due to pollen       Relevant Medications   montelukast (SINGULAIR) 10 MG tablet   Dyslipidemia       Relevant Orders   Lipid panel   Need for hepatitis C screening test       Relevant Orders   Hepatitis C antibody       Meds ordered this encounter  Medications  . levothyroxine (SYNTHROID, LEVOTHROID) 50 MCG tablet    Sig: Take 1 tablet (50 mcg total) by mouth daily.    Dispense:  90 tablet    Refill:  3  . benzonatate (TESSALON) 100 MG capsule    Sig: TAKE 1 CAPSULE (100 MG TOTAL) BY MOUTH 2 (TWO) TIMES DAILY AS NEEDED- [facility]    Dispense:  180 capsule    Refill:  3  . hydrochlorothiazide (HYDRODIURIL) 25 MG tablet    Sig: Take 1 tablet (25 mg total) by mouth daily.    Dispense:  90 tablet    Refill:  3  . DISCONTD: ranitidine (ZANTAC) 150 MG tablet    Sig: Take 1 tablet (150 mg total) by mouth 2 (two) times daily.    Dispense:  180 tablet    Refill:  3  . ranitidine (ZANTAC) 150 MG tablet    Sig: Take 1 tablet (150 mg total) by mouth 2 (two) times daily.    Dispense:  180 tablet    Refill:  3    sched appt  . montelukast (SINGULAIR) 10 MG tablet    Sig: Take 1 tablet (10 mg total) by mouth at bedtime.    Dispense:  30 tablet    Refill:  3  . ranitidine (ZANTAC) 150 MG tablet    Sig: Take 1 tablet (150 mg total) by mouth 2 (two) times daily.    Dispense:  180 tablet    Refill:  3   Health risks of being over weight were discussed and patient was counseled on weight loss options and exercise.   Dr. Mac[facility]  10/17/17

## 2017-10-17 NOTE — Patient Instructions (Signed)

## 2017-10-18 LAB — LIPID PANEL
Chol/HDL Ratio: 2.7 ratio (ref 0.0–4.4)
Cholesterol, Total: 208 mg/dL — ABNORMAL HIGH (ref 100–199)
HDL: 76 mg/dL (ref >39)
LDL Calculated: 110 mg/dL — ABNORMAL HIGH (ref 0–99)
Triglycerides: 109 mg/dL (ref 0–149)
VLDL Cholesterol Cal: 22 mg/dL (ref 5–40)

## 2017-10-18 LAB — COMPREHENSIVE METABOLIC PANEL
ALT: 20 IU/L (ref 0–32)
AST: 17 IU/L (ref 0–40)
Albumin/Globulin Ratio: 2 (ref 1.2–2.2)
Albumin: 4.5 g/dL (ref 3.5–4.8)
Alkaline Phosphatase: 104 IU/L (ref 39–117)
BUN/Creatinine Ratio: 26 (ref 12–28)
BUN: 30 mg/dL — ABNORMAL HIGH (ref 8–27)
Bilirubin Total: 0.3 mg/dL (ref 0.0–1.2)
CO2: 24 mmol/L (ref 20–29)
Calcium: 9.6 mg/dL (ref 8.7–10.3)
Chloride: 100 mmol/L (ref 96–106)
Creatinine, Ser: 1.15 mg/dL — ABNORMAL HIGH (ref 0.57–1.00)
GFR calc Af Amer: 55 mL/min/{1.73_m2} — ABNORMAL LOW (ref >59)
GFR calc non Af Amer: 47 mL/min/{1.73_m2} — ABNORMAL LOW (ref >59)
Globulin, Total: 2.2 g/dL (ref 1.5–4.5)
Glucose: 122 mg/dL — ABNORMAL HIGH (ref 65–99)
Potassium: 3.5 mmol/L (ref 3.5–5.2)
Sodium: 143 mmol/L (ref 134–144)
Total Protein: 6.7 g/dL (ref 6.0–8.5)

## 2017-10-18 LAB — TSH: TSH: 2.63 u[IU]/mL (ref 0.450–4.500)

## 2017-10-18 LAB — HEPATITIS C ANTIBODY: Hep C Virus Ab: <0.1 s/co ratio (ref 0.0–0.9)

## 2017-11-11 ENCOUNTER — Encounter: Payer: Medicare Other | Admitting: Family Medicine

## 2018-02-06 ENCOUNTER — Ambulatory Visit (INDEPENDENT_AMBULATORY_CARE_PROVIDER_SITE_OTHER): Payer: Medicare Other

## 2018-02-06 VITALS — BP 122/60 | HR 70 | Temp 97.7°F | Resp 12 | Ht 63.0 in | Wt 203.8 lb

## 2018-02-06 DIAGNOSIS — Z0000 Encounter for general adult medical examination without abnormal findings: Secondary | ICD-10-CM

## 2018-02-06 NOTE — Patient Instructions (Signed)
Linda Vazquez , Thank you for taking time to come for your Medicare Wellness Visit. I appreciate your ongoing commitment to your health goals. Please review the following plan we discussed and let me know if I can assist you in the future.   Screening recommendations/referrals: Colorectal Screening: Completed 04/22/16. Repeat every 10 years Mammogram: No longer required Bone Density: Completed 11/11/15. Osteoporotic screenings no longer required  Vision and Dental Exams: Recommended annual ophthalmology exams for early detection of glaucoma and other disorders of the eye Recommended annual dental exams for proper oral hygiene  Vaccinations: Influenza vaccine: Up to date Pneumococcal vaccine: Completed series Tdap vaccine: Up to date Shingles vaccine: Up to date. Please call your insurance company to determine your out of pocket expense for the Shingrix vaccine. You may also receive this vaccine at your local pharmacy or Health Dept.   Advanced directives: Please bring a copy of your POA (Power of Attorney) and/or Living Will to your next appointment.  Conditions/risks identified: Recommend to drink at least 6-8 8oz glasses of water per day.  Next appointment: Please schedule your Annual Wellness Visit with your Nurse Health Advisor in one year.  Preventive Care 24 Years and Older, Female Preventive care refers to lifestyle choices and visits with your health care provider that can promote health and wellness. What does preventive care include?  A yearly physical exam. This is also called an annual well check.  Dental exams once or twice a year.  Routine eye exams. Ask your health care provider how often you should have your eyes checked.  Personal lifestyle choices, including:  Daily care of your teeth and gums.  Regular physical activity.  Eating a healthy diet.  Avoiding tobacco and drug use.  Limiting alcohol use.  Practicing safe sex.  Taking low-dose aspirin every  day.  Taking vitamin and mineral supplements as recommended by your health care provider. What happens during an annual well check? The services and screenings done by your health care provider during your annual well check will depend on your age, overall health, lifestyle risk factors, and family history of disease. Counseling  Your health care provider may ask you questions about your:  Alcohol use.  Tobacco use.  Drug use.  Emotional well-being.  Home and relationship well-being.  Sexual activity.  Eating habits.  History of falls.  Memory and ability to understand (cognition).  Work and work Statistician.  Reproductive health. Screening  You may have the following tests or measurements:  Height, weight, and BMI.  Blood pressure.  Lipid and cholesterol levels. These may be checked every 5 years, or more frequently if you are over 48 years old.  Skin check.  Lung cancer screening. You may have this screening every year starting at age 94 if you have a 30-pack-year history of smoking and currently smoke or have quit within the past 15 years.  Fecal occult blood test (FOBT) of the stool. You may have this test every year starting at age 2.  Flexible sigmoidoscopy or colonoscopy. You may have a sigmoidoscopy every 5 years or a colonoscopy every 10 years starting at age 41.  Hepatitis C blood test.  Hepatitis B blood test.  Sexually transmitted disease (STD) testing.  Diabetes screening. This is done by checking your blood sugar (glucose) after you have not eaten for a while (fasting). You may have this done every 1-3 years.  Bone density scan. This is done to screen for osteoporosis. You may have this done starting at age  65.  Mammogram. This may be done every 1-2 years. Talk to your health care provider about how often you should have regular mammograms. Talk with your health care provider about your test results, treatment options, and if necessary, the need  for more tests. Vaccines  Your health care provider may recommend certain vaccines, such as:  Influenza vaccine. This is recommended every year.  Tetanus, diphtheria, and acellular pertussis (Tdap, Td) vaccine. You may need a Td booster every 10 years.  Zoster vaccine. You may need this after age 25.  Pneumococcal 13-valent conjugate (PCV13) vaccine. One dose is recommended after age 43.  Pneumococcal polysaccharide (PPSV23) vaccine. One dose is recommended after age 80. Talk to your health care provider about which screenings and vaccines you need and how often you need them. This information is not intended to replace advice given to you by your health care provider. Make sure you discuss any questions you have with your health care provider. Document Released: 10/03/2015 Document Revised: 05/26/2016 Document Reviewed: 07/08/2015 Elsevier Interactive Patient Education  2017 [place] Prevention in the Home Falls can cause injuries. They can happen to people of all ages. There are many things you can do to make your home safe and to help prevent falls. What can I do on the outside of my home?  Regularly fix the edges of walkways and driveways and fix any cracks.  Remove anything that might make you trip as you walk through a door, such as a raised step or threshold.  Trim any bushes or trees on the path to your home.  Use bright outdoor lighting.  Clear any walking paths of anything that might make someone trip, such as rocks or tools.  Regularly check to see if handrails are loose or broken. Make sure that both sides of any steps have handrails.  Any raised decks and porches should have guardrails on the edges.  Have any leaves, snow, or ice cleared regularly.  Use sand or salt on walking paths during winter.  Clean up any spills in your garage right away. This includes oil or grease spills. What can I do in the bathroom?  Use night lights.  Install grab bars  by the toilet and in the tub and shower. Do not use towel bars as grab bars.  Use non-skid mats or decals in the tub or shower.  If you need to sit down in the shower, use a plastic, non-slip stool.  Keep the floor dry. Clean up any water that spills on the floor as soon as it happens.  Remove soap buildup in the tub or shower regularly.  Attach bath mats securely with double-sided non-slip rug tape.  Do not have throw rugs and other things on the floor that can make you trip. What can I do in the bedroom?  Use night lights.  Make sure that you have a light by your bed that is easy to reach.  Do not use any sheets or blankets that are too big for your bed. They should not hang down onto the floor.  Have a firm chair that has side arms. You can use this for support while you get dressed.  Do not have throw rugs and other things on the floor that can make you trip. What can I do in the kitchen?  Clean up any spills right away.  Avoid walking on wet floors.  Keep items that you use a lot in easy-to-reach places.  If you need  to reach something above you, use a strong step stool that has a grab bar.  Keep electrical cords out of the way.  Do not use floor polish or wax that makes floors slippery. If you must use wax, use non-skid floor wax.  Do not have throw rugs and other things on the floor that can make you trip. What can I do with my stairs?  Do not leave any items on the stairs.  Make sure that there are handrails on both sides of the stairs and use them. Fix handrails that are broken or loose. Make sure that handrails are as long as the stairways.  Check any carpeting to make sure that it is firmly attached to the stairs. Fix any carpet that is loose or worn.  Avoid having throw rugs at the top or bottom of the stairs. If you do have throw rugs, attach them to the floor with carpet tape.  Make sure that you have a light switch at the top of the stairs and the  bottom of the stairs. If you do not have them, ask someone to add them for you. What else can I do to help prevent falls?  Wear shoes that:  Do not have high heels.  Have rubber bottoms.  Are comfortable and fit you well.  Are closed at the toe. Do not wear sandals.  If you use a stepladder:  Make sure that it is fully opened. Do not climb a closed stepladder.  Make sure that both sides of the stepladder are locked into place.  Ask someone to hold it for you, if possible.  Clearly mark and make sure that you can see:  Any grab bars or handrails.  First and last steps.  Where the edge of each step is.  Use tools that help you move around (mobility aids) if they are needed. These include:  Canes.  Walkers.  Scooters.  Crutches.  Turn on the lights when you go into a dark area. Replace any light bulbs as soon as they burn out.  Set up your furniture so you have a clear path. Avoid moving your furniture around.  If any of your floors are uneven, fix them.  If there are any pets around you, be aware of where they are.  Review your medicines with your doctor. Some medicines can make you feel dizzy. This can increase your chance of falling. Ask your doctor what other things that you can do to help prevent falls. This information is not intended to replace advice given to you by your health care provider. Make sure you discuss any questions you have with your health care provider. Document Released: 07/03/2009 Document Revised: 02/12/2016 Document Reviewed: 10/11/2014 Elsevier Interactive Patient Education  2017 [employer].

## 2018-02-06 NOTE — Progress Notes (Signed)
Subjective:   Linda Vazquez is a 72 y.o. female who presents for an Initial Medicare Annual Wellness Visit.  Review of Systems    N/A  Cardiac Risk Factors include: advanced age (>67men, >45 women);sedentary lifestyle;obesity (BMI >30kg/m2);hypertension     Objective:    Today's Vitals   02/06/18 0811  BP: 122/60  Pulse: 70  Resp: 12  Temp: 97.7 F (36.5 C)  TempSrc: Oral  SpO2: 92%  Weight: 203 lb 12.8 oz (92.4 kg)  Height: 5\' 3"  (1.6 m)   Body mass index is 36.1 kg/m.  Advanced Directives 02/06/2018 11/06/2015  Does Patient Have a Medical Advance Directive? Yes Yes  Type of Paramedic of [place];Living will [place];Living will  Copy of [place] in Chart? No - copy requested No - copy requested    Current Medications (verified) Outpatient Encounter Medications as of 02/06/2018  Medication Sig  . amitriptyline (ELAVIL) 10 MG tablet Take 1 tablet by mouth at bedtime. Dr Rodolph Bong  . azelastine (ASTELIN) 0.1 % nasal spray Place 1 spray into the nose 2 (two) times daily. [facility]  . benzonatate (TESSALON) 100 MG capsule TAKE 1 CAPSULE (100 MG TOTAL) BY MOUTH 2 (TWO) TIMES DAILY AS NEEDED- [facility]  . cetirizine-pseudoephedrine (ZYRTEC-D) 5-120 MG tablet Take 1 tablet by mouth daily. OTC  . clonazePAM (KLONOPIN) 0.5 MG tablet Otolaryn. DR Patrice Paradise  . hydrochlorothiazide (HYDRODIURIL) 25 MG tablet Take 1 tablet (25 mg total) by mouth daily.  Marland Kitchen levothyroxine (SYNTHROID, LEVOTHROID) 50 MCG tablet Take 1 tablet (50 mcg total) by mouth daily.  . mometasone-formoterol (DULERA) 200-5 MCG/ACT AERO Inhale into the lungs. [facility]  . ranitidine (ZANTAC) 150 MG tablet Take 1 tablet (150 mg total) by mouth 2 (two) times daily.  Marland Kitchen aspirin EC 81 MG tablet Take 1 tablet by mouth daily.  . montelukast (SINGULAIR) 10 MG tablet Take 1 tablet (10 mg total) by mouth at bedtime. (Patient not taking: Reported on 02/06/2018)  .  [DISCONTINUED] ranitidine (ZANTAC) 150 MG tablet Take 1 tablet (150 mg total) by mouth 2 (two) times daily.   No facility-administered encounter medications on file as of 02/06/2018.     Allergies (verified) Ace inhibitors and Sulfa antibiotics   History: Past Medical History:  Diagnosis Date  . Allergy   . Breast cancer ([place])   . Cancer ([place])   . GERD (gastroesophageal reflux disease)   . Hypertension   . Melanoma ([place])   . Thyroid disease    Past Surgical History:  Procedure Laterality Date  . BIOPSY THYROID     benign  . BREAST LUMPECTOMY Right   . COLONOSCOPY  2006   repeat in 10 yrs- [facility] Dr  . KNEE ARTHROSCOPY WITH MENISCAL REPAIR Bilateral   . MASTECTOMY Left   . MASTECTOMY Right    2 yrs later  . MEDIASTINAL MASS EXCISION    . 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 TONSILLECTOMY    . tubal cauterization     Family History  Problem Relation Age of Onset  . Hypertension Mother   . Heart disease Mother   . Heart disease Father   . Diabetes Brother    Social History   Socioeconomic History  . Marital status: Married    Spouse name: Not on file  . Number of children: 0  . Years of education: Not on file  . Highest education level:  Master's degree (e.g., MA, MS, MEng, MEd, MSW, MBA)  Occupational History  . Occupation: Retired  Scientific laboratory technician  . Financial resource strain: Not hard at all  . Food insecurity:    Worry: Never true    Inability: Never true  . Transportation needs:    Medical: No    Non-medical: No  Tobacco Use  . Smoking status: Never Smoker  . Smokeless tobacco: Never Used  . Tobacco comment: Smoking cessation materials not required  Substance and Sexual Activity  . Alcohol use: Yes    Alcohol/week: 0.0 oz    Comment: socially  . Drug use: No  . Sexual activity: Not Currently  Lifestyle  . Physical activity:    Days per week: 0 days    Minutes per session: 0 min  . Stress: Only a little  Relationships    . Social connections:    Talks on phone: Patient refused    Gets together: Patient refused    Attends religious service: Patient refused    Active member of club or organization: Patient refused    Attends meetings of clubs or organizations: Patient refused    Relationship status: Married  Other Topics Concern  . Not on file  Social History Narrative  . Not on file    Tobacco Counseling Counseling given: No Comment: Smoking cessation materials not required  Clinical Intake:  Pre-visit preparation completed: Yes  Pain : No/denies pain   BMI - recorded: 36.1 Nutritional Status: BMI > 30  Obese Nutritional Risks: None Diabetes: No  How often do you need to have someone help you when you read instructions, pamphlets, or other written materials from your doctor or pharmacy?: 1 - Never  Interpreter Needed?: No  Information entered by :: AE[place]sole, LPN   Activities of Daily Living In your present state of health, do you have any difficulty performing the following activities: 02/06/2018  Hearing? N  Comment denies hearing aids  Vision? N  Comment wears eyeglasses, R cataract  Difficulty concentrating or making decisions? N  Walking or climbing stairs? Y  Comment dyspnea  Dressing or bathing? N  Doing errands, shopping? N  Preparing Food and eating ? N  Comment denies dentures  Using the Toilet? N  In the past six months, have you accidently leaked urine? N  Do you have problems with loss of bowel control? N  Managing your Medications? N  Managing your Finances? N  Housekeeping or managing your Housekeeping? N  Some recent data might be hidden     Immunizations and Health Maintenance Immunization History  Administered Date(s) Administered  . Influenza, High Dose Seasonal PF 07/06/2017  . Influenza-Unspecified 05/21/2014, 06/24/2014, 06/03/2015, 04/20/2016, 05/25/2016, 07/06/2017  . Pneumococcal Conjugate-13 11/10/2016  . Pneumococcal Polysaccharide-23  01/16/2014  . Tdap 11/06/2015  . Zoster 03/09/2012   There are no preventive care reminders to display for this patient.  Patient Care Team: Juline Patch, MD as PCP - General (Family Medicine) Ihor Dow, MD as Consulting Physician (Pulmonary Disease) Everlean Patterson, MD as Consulting Physician (Otolaryngology) Patrice Paradise Carlynn Spry, MD as Consulting Physician (Otolaryngology)  Indicate any recent Medical Services you may have received from other than [facility] providers in the past year (date may be approximate).     Assessment:   This is a routine wellness examination for Alizaya.  Hearing/Vision screen Vision Screening Comments: Sees Dr. Glennon Mac for annual eye exams  Dietary issues and exercise activities discussed: Current Exercise Habits: The patient does not participate in regular  exercise at present, Exercise limited by: None identified  Goals    . DIET - INCREASE WATER INTAKE     Recommend to drink at least 6-8 8oz glasses of water per day.      Depression Screen PHQ 2/9 Scores 02/06/2018 11/10/2016 11/10/2015 11/06/2015  PHQ - 2 Score 2 0 0 0  PHQ- 9 Score 5 - - -    Fall Risk Fall Risk  02/06/2018 11/10/2016 11/10/2015 11/06/2015  Falls in the past year? No No No No  Risk for fall due to : Impaired vision - - -  Risk for fall due to: Comment wears eyeglasses, R cataract - - -    FALL RISK PREVENTION PERTAINING TO HOME: Is your home free of loose throw rugs in walkways, pet beds, electrical cords, etc? Yes Is there adequate lighting in your home to reduce risk of falls?  Yes Are there stairs in or around your home WITH handrails? Yes  ASSISTIVE DEVICES UTILIZED TO PREVENT FALLS: Use of a cane, walker or w/c? No Grab bars in the bathroom? No  Shower chair or a place to sit while bathing? No An elevated toilet seat or a handicapped toilet? No  Timed Get Up and Go Performed: Yes. Pt ambulated 10 feet within 11 sec. Gait slow, steady and without the use of an  assistive device. No intervention required at this time. Fall risk prevention has been discussed.  Community Resource Referral:  Pt declined my offer to send [employer] Referral to Care Guide for installation of grab bars in the shower, shower chair or an elevated toilet seat.  Cognitive Function:     6CIT Screen 02/06/2018  What Year? 0 points  What month? 0 points  What time? 0 points  Count back from 20 0 points  Months in reverse 0 points  Repeat phrase 0 points  Total Score 0    Screening Tests Health Maintenance  Topic Date Due  . INFLUENZA VACCINE  04/20/2018  . TETANUS/TDAP  11/05/2025  . COLONOSCOPY  04/22/2026  . DEXA SCAN  Completed  . Hepatitis C Screening  Completed  . PNA vac Low Risk Adult  Completed  . MAMMOGRAM  Discontinued    Qualifies for Shingles Vaccine? Yes. Zostavax completed 03/09/12. Due for Shingrix. Education has been provided regarding the importance of this vaccine. Pt has been advised to call her insurance company to determine her out of pocket expense. Advised she may also receive this vaccine at her local pharmacy or Health Dept. Verbalized acceptance and understanding.  Cancer Screenings: Lung: Low Dose CT Chest recommended if Age 22-80 years, 30 pack-year currently smoking OR have quit w/in 15years. Patient does not qualify. Breast: Up to date on Mammogram? Yes. No longer required d/t B mastectomy Up to date of Bone Density/Dexa? Yes. Completed 11/11/15. Osteoporotic screenings no longer required Colorectal: Completed 04/22/16. Repeat every 10 years  Additional Screenings: Hepatitis C Screening: Completed 10/17/17   Plan:  I have personally reviewed and addressed the Medicare Annual Wellness questionnaire and have noted the following in the patient's chart:  A. Medical and social history B. Use of alcohol, tobacco or illicit drugs  C. Current medications and supplements D. Functional ability and status E.  Nutritional status F.    Physical activity G. Advance directives H. List of other physicians I.  Hospitalizations, surgeries, and ER visits in previous 12 months J.  [place] such as hearing and vision if needed, cognitive and depression L. Referrals and appointments  In addition, I have reviewed and discussed with patient certain preventive protocols, quality metrics, and best practice recommendations. A written personalized care plan for preventive services as well as general preventive health recommendations were provided to patient.  Signed,  Aleatha Borer, LPN Nurse Health Advisor  MD Recommendations: Zostavax completed 03/09/12. Due for Shingrix. Education has been provided regarding the importance of this vaccine. Pt has been advised to call her insurance company to determine her out of pocket expense. Advised she may also receive this vaccine at her local pharmacy or Health Dept. Verbalized acceptance and understanding.

## 2018-02-14 ENCOUNTER — Encounter: Payer: Medicare Other | Admitting: Family Medicine

## 2018-05-03 ENCOUNTER — Encounter: Payer: Self-pay | Admitting: Family Medicine

## 2018-05-03 ENCOUNTER — Ambulatory Visit: Payer: Medicare Other | Admitting: Family Medicine

## 2018-05-03 VITALS — BP 122/82 | HR 88 | Resp 16 | Ht 63.0 in | Wt 202.0 lb

## 2018-05-03 DIAGNOSIS — J4520 Mild intermittent asthma, uncomplicated: Secondary | ICD-10-CM

## 2018-05-03 DIAGNOSIS — E039 Hypothyroidism, unspecified: Secondary | ICD-10-CM

## 2018-05-03 DIAGNOSIS — I10 Essential (primary) hypertension: Secondary | ICD-10-CM

## 2018-05-03 MED ORDER — ALBUTEROL SULFATE HFA 108 (90 BASE) MCG/ACT IN AERS: 2.0000 | INHALATION_SPRAY | Freq: Four times a day (QID) | RESPIRATORY_TRACT | 2 refills | Status: DC | PRN

## 2018-05-03 MED ORDER — LEVOTHYROXINE SODIUM 50 MCG PO TABS: 50.0000 ug | ORAL_TABLET | Freq: Every day | ORAL | 1 refills | Status: DC

## 2018-05-03 MED ORDER — HYDROCHLOROTHIAZIDE 25 MG PO TABS: 25.0000 mg | ORAL_TABLET | Freq: Every day | ORAL | 1 refills | Status: DC

## 2018-05-03 NOTE — Progress Notes (Signed)
Name: AMEIA MORENCY   MRN: [id number]    DOB: [birth year]   Date:05/03/2018       Progress Note  Subjective  Chief Complaint  Chief Complaint  Patient presents with  . Hypertension  . Thyroid Problem  . COPD    Hypertension  This is a chronic problem. The current episode started more than 1 year ago. The problem has been waxing and waning since onset. The problem is controlled. Pertinent negatives include no anxiety, blurred vision, chest pain, headaches, malaise/fatigue, neck pain, orthopnea, palpitations, peripheral edema, PND, shortness of breath or sweats. There are no associated agents to hypertension. There are no known risk factors for coronary artery disease. Past treatments include diuretics. The current treatment provides moderate improvement. There are no compliance problems.  There is no history of angina, kidney disease, CAD/MI, CVA, heart failure, left ventricular hypertrophy, PVD or retinopathy. Identifiable causes of hypertension include a thyroid problem. There is no history of chronic renal disease, a hypertension causing med or renovascular disease.  Thyroid Problem  Presents for follow-up visit. Symptoms include cold intolerance, fatigue and leg swelling. Patient reports no anxiety, constipation, depressed mood, diaphoresis, diarrhea, dry skin, hair loss, heat intolerance, hoarse voice, menstrual problem, nail problem, palpitations, tremors, visual change, weight gain or weight loss. The symptoms have been stable. There is no history of heart failure.  COPD  She complains of wheezing. There is no chest tightness, cough, difficulty breathing, frequent throat clearing, hemoptysis, hoarse voice, shortness of breath or sputum production. This is a chronic problem. The current episode started more than 1 year ago. The problem occurs intermittently. The problem has been waxing and waning. Pertinent negatives include no chest pain, ear pain, fever, headaches, heartburn,  malaise/fatigue, myalgias, nasal congestion, PND, sore throat, sweats or weight loss. Her symptoms are aggravated by change in weather. Her symptoms are alleviated by beta-agonist and steroid inhaler. Her past medical history is significant for COPD.    BP (high blood pressure) Chronic Stable  Continue HCTZ 25 mg daily. Check renal panel.   Adult hypothyroidism Chronic Stable. Check TSH and and adjust levothyroxine accordingly.   Past Medical History:  Diagnosis Date  . Allergy   . Breast cancer ([place])   . Cancer ([place])   . GERD (gastroesophageal reflux disease)   . Hypertension   . Melanoma ([place])   . Thyroid disease     Past Surgical History:  Procedure Laterality Date  . BIOPSY THYROID     benign  . BREAST LUMPECTOMY Right   . COLONOSCOPY  2006   repeat in 10 yrs- [facility] Dr  . KNEE ARTHROSCOPY WITH MENISCAL REPAIR Bilateral   . MASTECTOMY Left   . MASTECTOMY Right    2 yrs later  . MEDIASTINAL MASS EXCISION    . 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 TONSILLECTOMY    . tubal cauterization      Family History  Problem Relation Age of Onset  . Hypertension Mother   . Heart disease Mother   . Heart disease Father   . Diabetes Brother     Social History   Socioeconomic History  . Marital status: Married    Spouse name: Not on file  . Number of children: 0  . Years of education: Not on file  . Highest education level: Master's degree (e.g., MA, MS, MEng, MEd, MSW, MBA)  Occupational History  . 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 Food insecurity:    Worry: Never true    Inability: Never true  . Transportation needs:    Medical: No    Non-medical: No  Tobacco Use  . Smoking status: Never Smoker  . Smokeless tobacco: Never Used  . Tobacco comment: Smoking cessation materials not required  Substance and Sexual Activity  . Alcohol use: Yes    Alcohol/week: 0.0 standard drinks     Comment: socially  . Drug use: No  . Sexual activity: Not Currently  Lifestyle  . Physical activity:    Days per week: 0 days    Minutes per session: 0 min  . Stress: Only a little  Relationships  . Social connections:    Talks on phone: Patient refused    Gets together: Patient refused    Attends religious service: Patient refused    Active member of club or organization: Patient refused    Attends meetings of clubs or organizations: Patient refused    Relationship status: Married  . Intimate partner violence:    Fear of current or ex partner: No    Emotionally abused: No    Physically abused: No    Forced sexual activity: No  Other Topics Concern  . Not on file  Social History Narrative  . Not on file    Allergies  Allergen Reactions  . Ace Inhibitors Cough    Per pt bp med caused cough possible ace inhibitor  . Sulfa Antibiotics Other (See Comments)    Other Reaction: Not Assessed    Outpatient Medications Prior to Visit  Medication Sig Dispense Refill  . aspirin EC 81 MG tablet Take 1 tablet by mouth daily.    Marland Kitchen azelastine (ASTELIN) 0.1 % nasal spray Place 1 spray into the nose 2 (two) times daily. [facility]    . benzonatate (TESSALON) 100 MG capsule TAKE 1 CAPSULE (100 MG TOTAL) BY MOUTH 2 (TWO) TIMES DAILY AS NEEDED- [facility] 180 capsule 3  . cetirizine-pseudoephedrine (ZYRTEC-D) 5-120 MG tablet Take 1 tablet by mouth daily. OTC    . clonazePAM (KLONOPIN) 0.5 MG tablet Otolaryn. DR Patrice Paradise    . mometasone-formoterol (DULERA) 200-5 MCG/ACT AERO Inhale into the lungs. [facility]    . montelukast (SINGULAIR) 10 MG tablet Take 1 tablet (10 mg total) by mouth at bedtime. 30 tablet 3  . omeprazole (PRILOSEC) 40 MG capsule TAKE 1 CAPSULE BY MOUTH ONCE DAILY TAKE 30 MINUTES PRIOR TO BREAKFAST.  11  . hydrochlorothiazide (HYDRODIURIL) 25 MG tablet Take 1 tablet (25 mg total) by mouth daily. 90 tablet 3  . levothyroxine (SYNTHROID, LEVOTHROID) 50 MCG tablet Take 1 tablet (50  mcg total) by mouth daily. 90 tablet 3  . ranitidine (ZANTAC) 150 MG tablet Take 1 tablet (150 mg total) by mouth 2 (two) times daily. 180 tablet 3  . amitriptyline (ELAVIL) 10 MG tablet Take 1 tablet by mouth at bedtime. Dr Rodolph Bong     No facility-administered medications prior to visit.     Review of Systems  Constitutional: Positive for fatigue. Negative for chills, diaphoresis, fever, malaise/fatigue, weight gain and weight loss.  HENT: Negative for ear discharge, ear pain, hoarse voice and sore throat.   Eyes: Negative for blurred vision.  Respiratory: Positive for wheezing. Negative for cough, hemoptysis, sputum production and shortness of breath.   Cardiovascular: Negative for chest pain, palpitations, orthopnea, leg swelling and PND.  Gastrointestinal: Negative for abdominal pain, blood in stool, constipation, diarrhea,  heartburn, melena and nausea.  Genitourinary: Negative for dysuria, frequency, hematuria, menstrual problem and urgency.  Musculoskeletal: Negative for back pain, joint pain, myalgias and neck pain.  Skin: Negative for rash.  Neurological: Negative for dizziness, tingling, tremors, sensory change, focal weakness and headaches.  Endo/Heme/Allergies: Positive for cold intolerance. Negative for environmental allergies, heat intolerance and polydipsia. Does not bruise/bleed easily.  Psychiatric/Behavioral: Negative for depression and suicidal ideas. The patient is not nervous/anxious and does not have insomnia.      Objective  Vitals:   05/03/18 0919  BP: 122/82  Pulse: 88  Resp: 16  SpO2: 97%  Weight: 202 lb (91.6 kg)  Height: 5\' 3"  (1.6 m)    Physical Exam  Constitutional: She is oriented to person, place, and time. She appears well-developed and well-nourished.  HENT:  Head: Normocephalic.  Right Ear: External ear normal.  Left Ear: External ear normal.  Mouth/Throat: Oropharynx is clear and moist. No oropharyngeal exudate.  Eyes: Pupils are equal,  round, and reactive to light. Conjunctivae and EOM are normal. Lids are everted and swept, no foreign bodies found. Left eye exhibits no hordeolum. No foreign body present in the left eye. Right conjunctiva is not injected. Left conjunctiva is not injected. No scleral icterus.  Neck: Normal range of motion. Neck supple. No JVD present. No tracheal deviation present. No thyromegaly present.  Cardiovascular: Normal rate, regular rhythm, normal heart sounds and intact distal pulses. Exam reveals no gallop and no friction rub.  No murmur heard. Pulmonary/Chest: Effort normal and breath sounds normal. No respiratory distress. She has no wheezes. She has no rales.  Abdominal: Soft. Bowel sounds are normal. She exhibits no mass. There is no hepatosplenomegaly. There is no tenderness. There is no rebound and no guarding.  Musculoskeletal: Normal range of motion. She exhibits no edema or tenderness.  Lymphadenopathy:    She has no cervical adenopathy.  Neurological: She is alert and oriented to person, place, and time. She has normal strength. She displays normal reflexes. No cranial nerve deficit.  Skin: Skin is warm. No rash noted.  Psychiatric: She has a normal mood and affect. Her mood appears not anxious. She does not exhibit a depressed mood.  Nursing note and vitals reviewed.     Assessment & Plan  Problem List Items Addressed This Visit      Cardiovascular and Mediastinum   BP (high blood pressure) - Primary    Chronic Stable  Continue HCTZ 25 mg daily. Check renal panel.       Relevant Medications   hydrochlorothiazide (HYDRODIURIL) 25 MG tablet   Other Relevant Orders   Renal Function Panel     Endocrine   Adult hypothyroidism    Chronic Stable. Check TSH and and adjust levothyroxine accordingly.      Relevant Medications   levothyroxine (SYNTHROID, LEVOTHROID) 50 MCG tablet   Other Relevant Orders   TSH    Other Visit Diagnoses    Mild intermittent asthma without  complication       Intermitant Stable Will initiate albuterol inhaler with current Dulera.   Relevant Medications   albuterol (PROVENTIL HFA;VENTOLIN HFA) 108 (90 Base) MCG/ACT inhaler      Meds ordered this encounter  Medications  . levothyroxine (SYNTHROID, LEVOTHROID) 50 MCG tablet    Sig: Take 1 tablet (50 mcg total) by mouth daily.    Dispense:  90 tablet    Refill:  1  . hydrochlorothiazide (HYDRODIURIL) 25 MG tablet    Sig: Take 1 tablet (  25 mg total) by mouth daily.    Dispense:  90 tablet    Refill:  1  . albuterol (PROVENTIL HFA;VENTOLIN HFA) 108 (90 Base) MCG/ACT inhaler    Sig: Inhale 2 puffs into the lungs every 6 (six) hours as needed for wheezing or shortness of breath.    Dispense:  1 Inhaler    Refill:  2      Dr. Macon [facility]  05/03/18

## 2018-05-03 NOTE — Assessment & Plan Note (Signed)
Chronic Stable  Continue HCTZ 25 mg daily. Check renal panel.

## 2018-05-03 NOTE — Assessment & Plan Note (Signed)
Chronic Stable. Check TSH and and adjust levothyroxine accordingly.

## 2018-05-04 LAB — RENAL FUNCTION PANEL
Albumin: 4.5 g/dL (ref 3.5–4.8)
BUN/Creatinine Ratio: 20 (ref 12–28)
BUN: 20 mg/dL (ref 8–27)
CO2: 27 mmol/L (ref 20–29)
Calcium: 10.1 mg/dL (ref 8.7–10.3)
Chloride: 99 mmol/L (ref 96–106)
Creatinine, Ser: 1.02 mg/dL — ABNORMAL HIGH (ref 0.57–1.00)
GFR calc Af Amer: 63 mL/min/{1.73_m2} (ref >59)
GFR calc non Af Amer: 54 mL/min/{1.73_m2} — ABNORMAL LOW (ref >59)
Glucose: 143 mg/dL — ABNORMAL HIGH (ref 65–99)
Phosphorus: 3.5 mg/dL (ref 2.5–4.5)
Potassium: 4.5 mmol/L (ref 3.5–5.2)
Sodium: 142 mmol/L (ref 134–144)

## 2018-05-04 LAB — TSH: TSH: 3.23 u[IU]/mL (ref 0.450–4.500)

## 2018-07-20 ENCOUNTER — Encounter: Payer: Self-pay | Admitting: Family Medicine

## 2018-07-20 ENCOUNTER — Ambulatory Visit: Payer: Medicare Other | Admitting: Family Medicine

## 2018-07-20 VITALS — BP 120/80 | HR 84 | Ht 63.0 in | Wt 206.0 lb

## 2018-07-20 DIAGNOSIS — T24211A Burn of second degree of right thigh, initial encounter: Secondary | ICD-10-CM | POA: Diagnosis not present

## 2018-07-20 MED ORDER — CEPHALEXIN 250 MG/5ML PO SUSR: 250.0000 mg | Freq: Four times a day (QID) | ORAL | 0 refills | Status: DC

## 2018-07-20 MED ORDER — MUPIROCIN 2 % EX OINT: 1.0000 "application " | TOPICAL_OINTMENT | Freq: Two times a day (BID) | CUTANEOUS | 0 refills | Status: DC
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
## 2018-07-24 ENCOUNTER — Encounter: Payer: Self-pay | Admitting: Family Medicine

## 2018-07-24 ENCOUNTER — Ambulatory Visit: Payer: Medicare Other | Admitting: Family Medicine

## 2018-07-24 VITALS — BP 120/80 | HR 80 | Ht 63.0 in | Wt 208.0 lb

## 2018-07-24 DIAGNOSIS — R739 Hyperglycemia, unspecified: Secondary | ICD-10-CM

## 2018-07-24 DIAGNOSIS — T24211D Burn of second degree of right thigh, subsequent encounter: Secondary | ICD-10-CM | POA: Diagnosis not present

## 2018-07-24 DIAGNOSIS — Z5189 Encounter for other specified aftercare: Secondary | ICD-10-CM
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
## 2018-07-27 ENCOUNTER — Ambulatory Visit: Payer: Medicare Other | Admitting: Physician Assistant

## 2018-07-31 ENCOUNTER — Encounter: Payer: Self-pay | Admitting: Family Medicine

## 2018-07-31 ENCOUNTER — Ambulatory Visit: Payer: Medicare Other | Admitting: Family Medicine

## 2018-07-31 VITALS — BP 118/70 | HR 68 | Ht 63.0 in | Wt 206.0 lb

## 2018-07-31 DIAGNOSIS — H6121 Impacted cerumen, right ear: Secondary | ICD-10-CM

## 2018-07-31 DIAGNOSIS — T300 Burn of unspecified body region, unspecified degree: Secondary | ICD-10-CM | POA: Diagnosis not present

## 2018-07-31 MED ORDER — CARBAMIDE PEROXIDE 6.5 % OT SOLN: 5.0000 [drp] | Freq: Two times a day (BID) | OTIC | 0 refills | Status: AC
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
## 2018-09-01 ENCOUNTER — Ambulatory Visit: Payer: Medicare Other | Admitting: Family Medicine

## 2018-09-01 ENCOUNTER — Encounter: Payer: Self-pay | Admitting: Family Medicine

## 2018-09-01 VITALS — BP 120/80 | HR 76 | Ht 63.0 in | Wt 208.0 lb

## 2018-09-01 DIAGNOSIS — T300 Burn of unspecified body region, unspecified degree: Secondary | ICD-10-CM

## 2018-09-01 MED ORDER — CEPHALEXIN 500 MG PO CAPS: 500.0000 mg | ORAL_CAPSULE | Freq: Two times a day (BID) | ORAL | 0 refills | Status: DC
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
## 2018-09-18 NOTE — Addendum Note (Signed)
Addended by: Fredderick Severance on: 09/18/2018 02:16 PM   Modules accepted: Orders

## 2018-11-03 ENCOUNTER — Encounter: Payer: Self-pay | Admitting: Family Medicine

## 2018-11-03 ENCOUNTER — Ambulatory Visit: Payer: Medicare Other | Admitting: Family Medicine

## 2018-11-03 VITALS — BP 122/72 | HR 76 | Ht 63.0 in | Wt 200.0 lb

## 2018-11-03 DIAGNOSIS — E039 Hypothyroidism, unspecified: Secondary | ICD-10-CM | POA: Diagnosis not present

## 2018-11-03 DIAGNOSIS — R739 Hyperglycemia, unspecified: Secondary | ICD-10-CM

## 2018-11-03 DIAGNOSIS — M79601 Pain in right arm: Secondary | ICD-10-CM | POA: Diagnosis not present

## 2018-11-03 DIAGNOSIS — I10 Essential (primary) hypertension: Secondary | ICD-10-CM

## 2018-11-03 MED ORDER — HYDROCHLOROTHIAZIDE 25 MG PO TABS: 25.0000 mg | ORAL_TABLET | Freq: Every day | ORAL | 1 refills | Status: DC

## 2018-11-03 MED ORDER — LEVOTHYROXINE SODIUM 50 MCG PO TABS: 50.0000 ug | ORAL_TABLET | Freq: Every day | ORAL | 1 refills | Status: DC

## 2018-11-03 NOTE — Progress Notes (Signed)
Date:  11/03/2018   Name:  Linda Vazquez   DOB:  01-05-1946   MRN:  [id number]   Chief Complaint: Hypertension and Hypothyroidism  Hypertension  This is a chronic problem. The current episode started more than 1 year ago. The problem is unchanged. The problem is controlled. Pertinent negatives include no anxiety, blurred vision, chest pain, headaches, malaise/fatigue, neck pain, orthopnea, palpitations, peripheral edema, PND, shortness of breath or sweats. There are no associated agents to hypertension. There are no known risk factors for coronary artery disease. Past treatments include diuretics. The current treatment provides moderate improvement. There are no compliance problems.  There is no history of angina, kidney disease, CAD/MI, CVA, heart failure, left ventricular hypertrophy, PVD or retinopathy. Identifiable causes of hypertension include a thyroid problem. There is no history of chronic renal disease, a hypertension causing med or renovascular disease.  Thyroid Problem  Presents for follow-up visit. Symptoms include anxiety. Patient reports no cold intolerance, constipation, depressed mood, diaphoresis, diarrhea, dry skin, fatigue, hair loss, heat intolerance, hoarse voice, leg swelling, menstrual problem, nail problem, palpitations, tremors, visual change, weight gain or weight loss. The symptoms have been stable. There is no history of heart failure.  Arm Pain   Incident onset: duration 6 months. The pain is present in the upper right arm. The quality of the pain is described as aching. The pain does not radiate. The pain is at a severity of 8/10. The pain is moderate (when it hurts). Pertinent negatives include no chest pain or numbness. Exacerbated by: position sleeping/reaching/carry heavy. She has tried rest for the symptoms. The treatment provided moderate relief.    Review of Systems  Constitutional: Negative.  Negative for chills, diaphoresis, fatigue, fever,  malaise/fatigue, unexpected weight change, weight gain and weight loss.  HENT: Negative for congestion, ear discharge, ear pain, hoarse voice, rhinorrhea, sinus pressure, sneezing and sore throat.   Eyes: Negative for blurred vision, photophobia, pain, discharge, redness and itching.  Respiratory: Negative for cough, shortness of breath, wheezing and stridor.   Cardiovascular: Negative for chest pain, palpitations, orthopnea and PND.  Gastrointestinal: Negative for abdominal pain, blood in stool, constipation, diarrhea, nausea and vomiting.  Endocrine: Negative for cold intolerance, heat intolerance, polydipsia, polyphagia and polyuria.  Genitourinary: Negative for dysuria, flank pain, frequency, hematuria, menstrual problem, pelvic pain, urgency, vaginal bleeding and vaginal discharge.  Musculoskeletal: Negative for arthralgias, back pain, myalgias and neck pain.  Skin: Negative for rash.  Allergic/Immunologic: Negative for environmental allergies and food allergies.  Neurological: Negative for dizziness, tremors, weakness, light-headedness, numbness and headaches.  Hematological: Negative for adenopathy. Does not bruise/bleed easily.  Psychiatric/Behavioral: Negative for dysphoric mood. The patient is nervous/anxious.     Patient Active Problem List   Diagnosis Date Noted  . Asthma, cough variant 05/29/2015  . 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 Chronic cough 10/31/2014  . Esophageal dysfunction 09/23/2014  . 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 BP (high blood pressure) 06/04/2013  . Dysphonia 03/01/2013  . Adult hypothyroidism 03/01/2013  . Malignant neoplasm of breast ([place]) 02/09/2013  . Acquired lymphedema 02/09/2013  . 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 Ace Inhibitors Cough    Per pt bp med caused cough possible ace inhibitor  . Sulfa Antibiotics Other (See Comments)    Other Reaction: Not Assessed    Past Surgical History:  Procedure  Laterality Date  . BIOPSY THYROID     benign  . BREAST LUMPECTOMY Right   .  COLONOSCOPY  2006   repeat in 10 yrs- [facility] Dr  . KNEE ARTHROSCOPY WITH MENISCAL REPAIR Bilateral   . MASTECTOMY Left   . MASTECTOMY Right    2 yrs later  . MEDIASTINAL MASS EXCISION    . 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 TONSILLECTOMY    . tubal cauterization      Social History   Tobacco Use  . Smoking status: Never Smoker  . Smokeless tobacco: Never Used  . Tobacco comment: Smoking cessation materials not required  Substance Use Topics  . Alcohol use: Yes    Alcohol/week: 0.0 standard drinks    Comment: socially  . Drug use: No     Medication list has been reviewed and updated.  Current Meds  Medication Sig  . albuterol (PROVENTIL HFA;VENTOLIN HFA) 108 (90 Base) MCG/ACT inhaler Inhale 2 puffs into the lungs every 6 (six) hours as needed for wheezing or shortness of breath.  Marland Kitchen amitriptyline (ELAVIL) 10 MG tablet Take 1 tablet by mouth at bedtime. Dr Rodolph Bong  . aspirin EC 81 MG tablet Take 1 tablet by mouth daily.  Marland Kitchen azelastine (ASTELIN) 0.1 % nasal spray Place 1 spray into the nose 2 (two) times daily. [facility]  . benzonatate (TESSALON) 100 MG capsule TAKE 1 CAPSULE (100 MG TOTAL) BY MOUTH 2 (TWO) TIMES DAILY AS NEEDED- [facility]  . carbamide peroxide (DEBROX) 6.5 % OTIC solution Place 5 drops into both ears 2 (two) times daily.  . cetirizine-pseudoephedrine (ZYRTEC-D) 5-120 MG tablet Take 1 tablet by mouth daily. OTC  . clonazePAM (KLONOPIN) 0.5 MG tablet Otolaryn. DR Patrice Paradise  . hydrochlorothiazide (HYDRODIURIL) 25 MG tablet Take 1 tablet (25 mg total) by mouth daily.  Marland Kitchen levothyroxine (SYNTHROID, LEVOTHROID) 50 MCG tablet Take 1 tablet (50 mcg total) by mouth daily.  . mometasone-formoterol (DULERA) 200-5 MCG/ACT AERO Inhale into the lungs. [facility]  . omeprazole (PRILOSEC) 40 MG capsule [facility]    PHQ 2/9 Scores 11/03/2018 07/20/2018 07/20/2018 02/06/2018  PHQ  - 2 Score 0 0 0 2  PHQ- 9 Score 0 0 - 5    Physical Exam Constitutional:      General: She is not in acute distress.    Appearance: She is not diaphoretic.  HENT:     Head: Normocephalic and atraumatic.     Right Ear: Tympanic membrane, ear canal and external ear normal.     Left Ear: Tympanic membrane, ear canal and external ear normal.     Nose: Nose normal.  Eyes:     General:        Right eye: No discharge.        Left eye: No discharge.     Conjunctiva/sclera: Conjunctivae normal.     Pupils: Pupils are equal, round, and reactive to light.  Neck:     Musculoskeletal: Normal range of motion and neck supple.     Thyroid: No thyroid mass, thyromegaly or thyroid tenderness.     Vascular: No JVD.  Cardiovascular:     Rate and Rhythm: Normal rate and regular rhythm.     Heart sounds: Normal heart sounds, S1 normal and S2 normal. No murmur. No systolic murmur. No diastolic murmur. No friction rub. No gallop. No S3 or S4 sounds.   Pulmonary:     Effort: Pulmonary effort is normal.     Breath sounds: Normal breath sounds.  Abdominal:     General: Bowel sounds are normal.  Palpations: Abdomen is soft. There is no mass.     Tenderness: There is no abdominal tenderness. There is no guarding.  Musculoskeletal: Normal range of motion.     Right upper arm: She exhibits tenderness and swelling. She exhibits no bony tenderness, no edema and no deformity.       Arms:     Right lower leg: No edema.     Left lower leg: No edema.     Comments: Pain with abduction  Lymphadenopathy:     Cervical: No cervical adenopathy.  Skin:    General: Skin is warm and dry.  Neurological:     Mental Status: She is alert.     Deep Tendon Reflexes: Reflexes are normal and symmetric.     BP 122/72   Pulse 76   Ht 5\' 3"  (1.6 m)   Wt 200 lb (90.7 kg)   BMI 35.43 kg/m   Assessment and Plan:  1. Essential hypertension Chronic.  Controlled.  Continue hydrochlorothiazide 25 mg and check renal  function panel. - hydrochlorothiazide (HYDRODIURIL) 25 MG tablet; Take 1 tablet (25 mg total) by mouth daily.  Dispense: 90 tablet; Refill: 1 - Renal Function Panel  2. Adult hypothyroidism Chronic.  Controlled.  Continue levothyroxine 50 mcg daily check TSH with thyroid panel. - levothyroxine (SYNTHROID, LEVOTHROID) 50 MCG tablet; Take 1 tablet (50 mcg total) by mouth daily.  Dispense: 90 tablet; Refill: 1 - Thyroid Panel With TSH  3. Hyperglycemia Patient's had episodes of hyperglycemia with out for diabetes.  Will check A1c. - HgB A1c  4. Pain of right upper extremity Has been having pain of her right upper arm that she attributes to the way she sleeps.  There is a swelling of the mid humerus area involving the muscles with mild tenderness. - Ambulatory referral to Orthopedic Surgery

## 2018-11-04 LAB — RENAL FUNCTION PANEL
Albumin: 4.6 g/dL (ref 3.7–4.7)
BUN/Creatinine Ratio: 26 (ref 12–28)
BUN: 25 mg/dL (ref 8–27)
CO2: 20 mmol/L (ref 20–29)
Calcium: 9.9 mg/dL (ref 8.7–10.3)
Chloride: 95 mmol/L — ABNORMAL LOW (ref 96–106)
Creatinine, Ser: 0.95 mg/dL (ref 0.57–1.00)
GFR calc Af Amer: 68 mL/min/{1.73_m2} (ref >59)
GFR calc non Af Amer: 59 mL/min/{1.73_m2} — ABNORMAL LOW (ref >59)
Glucose: 168 mg/dL — ABNORMAL HIGH (ref 65–99)
Phosphorus: 3.9 mg/dL (ref 3.0–4.3)
Potassium: 4.3 mmol/L (ref 3.5–5.2)
Sodium: 139 mmol/L (ref 134–144)

## 2018-11-04 LAB — THYROID PANEL WITH TSH
Free Thyroxine Index: 2.9 (ref 1.2–4.9)
T3 Uptake Ratio: 27 % (ref 24–39)
T4, Total: 10.8 ug/dL (ref 4.5–12.0)
TSH: 2.55 u[IU]/mL (ref 0.450–4.500)

## 2018-11-04 LAB — HEMOGLOBIN A1C
Est. average glucose Bld gHb Est-mCnc: 194 mg/dL
Hgb A1c MFr Bld: 8.4 % — ABNORMAL HIGH (ref 4.8–5.6)

## 2018-11-06 ENCOUNTER — Other Ambulatory Visit: Payer: Self-pay

## 2018-11-06 DIAGNOSIS — E119 Type 2 diabetes mellitus without complications: Secondary | ICD-10-CM

## 2018-11-06 MED ORDER — METFORMIN HCL 500 MG PO TABS: 500.0000 mg | ORAL_TABLET | Freq: Two times a day (BID) | ORAL | 0 refills | Status: DC

## 2018-11-06 NOTE — Patient Instructions (Signed)

## 2018-11-06 NOTE — Progress Notes (Unsigned)
Sent in Metformin 500mg  bid

## 2018-11-10 ENCOUNTER — Other Ambulatory Visit: Payer: Self-pay

## 2018-11-10 DIAGNOSIS — E119 Type 2 diabetes mellitus without complications: Secondary | ICD-10-CM

## 2018-11-10 MED ORDER — METFORMIN HCL ER 750 MG PO TB24: 750.0000 mg | ORAL_TABLET | Freq: Every day | ORAL | 1 refills | Status: DC

## 2018-11-10 NOTE — Progress Notes (Unsigned)
Pt called stating that she had the "worse diarrhea in her life" after starting the metformin. Was told that sometimes pt's have diarrhea when they start on metformin and it usually subsides. Will change over to XL and recheck in 1 month. If not better, endocrinology will take over.

## 2018-11-28 ENCOUNTER — Other Ambulatory Visit: Payer: Self-pay | Admitting: Family Medicine

## 2018-11-28 DIAGNOSIS — E119 Type 2 diabetes mellitus without complications: Secondary | ICD-10-CM

## 2018-12-02 ENCOUNTER — Other Ambulatory Visit: Payer: Self-pay | Admitting: Family Medicine

## 2018-12-02 DIAGNOSIS — E119 Type 2 diabetes mellitus without complications: Secondary | ICD-10-CM

## 2018-12-05 ENCOUNTER — Other Ambulatory Visit: Payer: Self-pay

## 2018-12-06 ENCOUNTER — Encounter: Payer: Self-pay | Admitting: Family Medicine

## 2018-12-06 ENCOUNTER — Other Ambulatory Visit: Payer: Self-pay

## 2018-12-06 ENCOUNTER — Ambulatory Visit: Payer: Medicare Other | Admitting: Family Medicine

## 2018-12-06 VITALS — BP 128/84 | HR 78 | Resp 16 | Ht 63.0 in | Wt 200.0 lb

## 2018-12-06 DIAGNOSIS — E119 Type 2 diabetes mellitus without complications: Secondary | ICD-10-CM

## 2018-12-06 MED ORDER — METFORMIN HCL ER 750 MG PO TB24: ORAL_TABLET | ORAL | 5 refills | Status: DC

## 2018-12-06 NOTE — Patient Instructions (Signed)
Diabetes Mellitus and Nutrition, Adult When you have diabetes (diabetes mellitus), it is very important to have healthy eating habits because your blood sugar (glucose) levels are greatly affected by what you eat and drink. Eating healthy foods in the appropriate amounts, at about the same times every day, can help you:  Control your blood glucose.  Lower your risk of heart disease.  Improve your blood pressure.  Reach or maintain a healthy weight. Every person with diabetes is different, and each person has different needs for a meal plan. Your health care provider may recommend that you work with a diet and nutrition specialist (dietitian) to make a meal plan that is best for you. Your meal plan may vary depending on factors such as:  The calories you need.  The medicines you take.  Your weight.  Your blood glucose, blood pressure, and cholesterol levels.  Your activity level.  Other health conditions you have, such as heart or kidney disease. How do carbohydrates affect me? Carbohydrates, also called carbs, affect your blood glucose level more than any other type of food. Eating carbs naturally raises the amount of glucose in your blood. Carb counting is a method for keeping track of how many carbs you eat. Counting carbs is important to keep your blood glucose at a healthy level, especially if you use insulin or take certain oral diabetes medicines. It is important to know how many carbs you can safely have in each meal. This is different for every person. Your dietitian can help you calculate how many carbs you should have at each meal and for each snack. Foods that contain carbs include:  Bread, cereal, rice, pasta, and crackers.  Potatoes and corn.  Peas, beans, and lentils.  Milk and yogurt.  Fruit and juice.  Desserts, such as cakes, cookies, ice cream, and candy. How does alcohol affect me? Alcohol can cause a sudden decrease in blood glucose (hypoglycemia),  especially if you use insulin or take certain oral diabetes medicines. Hypoglycemia can be a life-threatening condition. Symptoms of hypoglycemia (sleepiness, dizziness, and confusion) are similar to symptoms of having too much alcohol. If your health care provider says that alcohol is safe for you, follow these guidelines:  Limit alcohol intake to no more than 1 drink per day for nonpregnant women and 2 drinks per day for men. One drink equals 12 oz of beer, 5 oz of wine, or 1 oz of hard liquor.  Do not drink on an empty stomach.  Keep yourself hydrated with water, diet soda, or unsweetened iced tea.  Keep in mind that regular soda, juice, and other mixers may contain a lot of sugar and must be counted as carbs. What are tips for following this plan?  Reading food labels  Start by checking the serving size on the "Nutrition Facts" label of packaged foods and drinks. The amount of calories, carbs, fats, and other nutrients listed on the label is based on one serving of the item. Many items contain more than one serving per package.  Check the total grams (g) of carbs in one serving. You can calculate the number of servings of carbs in one serving by dividing the total carbs by 15. For example, if a food has 30 g of total carbs, it would be equal to 2 servings of carbs.  Check the number of grams (g) of saturated and trans fats in one serving. Choose foods that have low or no amount of these fats.  Check the number of   milligrams (mg) of salt (sodium) in one serving. Most people should limit total sodium intake to less than 2,300 mg per day.  Always check the nutrition information of foods labeled as "low-fat" or "nonfat". These foods may be higher in added sugar or refined carbs and should be avoided.  Talk to your dietitian to identify your daily goals for nutrients listed on the label. Shopping  Avoid buying canned, premade, or processed foods. These foods tend to be high in fat, sodium,  and added sugar.  Shop around the outside edge of the grocery store. This includes fresh fruits and vegetables, bulk grains, fresh meats, and fresh dairy. Cooking  Use low-heat cooking methods, such as baking, instead of high-heat cooking methods like deep frying.  Cook using healthy oils, such as olive, canola, or sunflower oil.  Avoid cooking with butter, cream, or high-fat meats. Meal planning  Eat meals and snacks regularly, preferably at the same times every day. Avoid going long periods of time without eating.  Eat foods high in fiber, such as fresh fruits, vegetables, beans, and whole grains. Talk to your dietitian about how many servings of carbs you can eat at each meal.  Eat 4-6 ounces (oz) of lean protein each day, such as lean meat, chicken, fish, eggs, or tofu. One oz of lean protein is equal to: ? 1 oz of meat, chicken, or fish. ? 1 egg. ?  cup of tofu.  Eat some foods each day that contain healthy fats, such as avocado, nuts, seeds, and fish. Lifestyle  Check your blood glucose regularly.  Exercise regularly as told by your health care provider. This may include: ? 150 minutes of moderate-intensity or vigorous-intensity exercise each week. This could be brisk walking, biking, or water aerobics. ? Stretching and doing strength exercises, such as yoga or weightlifting, at least 2 times a week.  Take medicines as told by your health care provider.  Do not use any products that contain nicotine or tobacco, such as cigarettes and e-cigarettes. If you need help quitting, ask your health care provider.  Work with a Social worker or diabetes educator to identify strategies to manage stress and any emotional and social challenges. Questions to ask a health care provider  Do I need to meet with a diabetes educator?  Do I need to meet with a dietitian?  What number can I call if I have questions?  When are the best times to check my blood glucose? Where to find more  information:  American Diabetes Association: diabetes.org  Academy of Nutrition and Dietetics: www.eatright.[employer] of Diabetes and Digestive and Kidney Diseases (NIH): [employer] Summary  A healthy meal plan will help you control your blood glucose and maintain a healthy lifestyle.  Working with a diet and nutrition specialist (dietitian) can help you make a meal plan that is best for you.  Keep in mind that carbohydrates (carbs) and alcohol have immediate effects on your blood glucose levels. It is important to count carbs and to use alcohol carefully. This information is not intended to replace advice given to you by your health care provider. Make sure you discuss any questions you have with your health care provider. Document Released: 06/03/2005 Document Revised: 04/06/2017 Document Reviewed: 10/11/2016 Elsevier Interactive Patient Education  2019 [place]. Carbohydrate Counting for Diabetes Mellitus, Adult  Carbohydrate counting is a method of keeping track of how many carbohydrates you eat. Eating carbohydrates naturally increases the amount of sugar (glucose) in the blood. Counting how  many carbohydrates you eat helps keep your blood glucose within normal limits, which helps you manage your diabetes (diabetes mellitus). It is important to know how many carbohydrates you can safely have in each meal. This is different for every person. A diet and nutrition specialist (registered dietitian) can help you make a meal plan and calculate how many carbohydrates you should have at each meal and snack. Carbohydrates are found in the following foods:  Grains, such as breads and cereals.  Dried beans and soy products.  Starchy vegetables, such as potatoes, peas, and corn.  Fruit and fruit juices.  Milk and yogurt.  Sweets and snack foods, such as cake, cookies, candy, chips, and soft drinks. How do I count carbohydrates? There are two ways to count  carbohydrates in food. You can use either of the methods or a combination of both. Reading "Nutrition Facts" on packaged food The "Nutrition Facts" list is included on the labels of almost all packaged foods and beverages in the [place] It includes:  The serving size.  Information about nutrients in each serving, including the grams (g) of carbohydrate per serving. To use the "Nutrition Facts":  Decide how many servings you will have.  Multiply the number of servings by the number of carbohydrates per serving.  The resulting number is the total amount of carbohydrates that you will be having. Learning standard serving sizes of other foods When you eat carbohydrate foods that are not packaged or do not include "Nutrition Facts" on the label, you need to measure the servings in order to count the amount of carbohydrates:  Measure the foods that you will eat with a food scale or measuring cup, if needed.  Decide how many standard-size servings you will eat.  Multiply the number of servings by 15. Most carbohydrate-rich foods have about 15 g of carbohydrates per serving. ? For example, if you eat 8 oz (170 g) of strawberries, you will have eaten 2 servings and 30 g of carbohydrates (2 servings x 15 g = 30 g).  For foods that have more than one food mixed, such as soups and casseroles, you must count the carbohydrates in each food that is included. The following list contains standard serving sizes of common carbohydrate-rich foods. Each of these servings has about 15 g of carbohydrates:   hamburger bun or  English muffin.   oz (15 mL) syrup.   oz (14 g) jelly.  1 slice of bread.  1 six-inch tortilla.  3 oz (85 g) cooked rice or pasta.  4 oz (113 g) cooked dried beans.  4 oz (113 g) starchy vegetable, such as peas, corn, or potatoes.  4 oz (113 g) hot cereal.  4 oz (113 g) mashed potatoes or  of a large baked potato.  4 oz (113 g) canned or frozen fruit.  4 oz (120 mL)  fruit juice.  4-6 crackers.  6 chicken nuggets.  6 oz (170 g) unsweetened dry cereal.  6 oz (170 g) plain fat-free yogurt or yogurt sweetened with artificial sweeteners.  8 oz (240 mL) milk.  8 oz (170 g) fresh fruit or one small piece of fruit.  24 oz (680 g) popped popcorn. Example of carbohydrate counting Sample meal  3 oz (85 g) chicken breast.  6 oz (170 g) brown rice.  4 oz (113 g) corn.  8 oz (240 mL) milk.  8 oz (170 g) strawberries with sugar-free whipped topping. Carbohydrate calculation 1. Identify the foods that contain carbohydrates: ?  Rice. ? Corn. ? Milk. ? Strawberries. 2. Calculate how many servings you have of each food: ? 2 servings rice. ? 1 serving corn. ? 1 serving milk. ? 1 serving strawberries. 3. Multiply each number of servings by 15 g: ? 2 servings rice x 15 g = 30 g. ? 1 serving corn x 15 g = 15 g. ? 1 serving milk x 15 g = 15 g. ? 1 serving strawberries x 15 g = 15 g. 4. Add together all of the amounts to find the total grams of carbohydrates eaten: ? 30 g + 15 g + 15 g + 15 g = 75 g of carbohydrates total. Summary  Carbohydrate counting is a method of keeping track of how many carbohydrates you eat.  Eating carbohydrates naturally increases the amount of sugar (glucose) in the blood.  Counting how many carbohydrates you eat helps keep your blood glucose within normal limits, which helps you manage your diabetes.  A diet and nutrition specialist (registered dietitian) can help you make a meal plan and calculate how many carbohydrates you should have at each meal and snack. This information is not intended to replace advice given to you by your health care provider. Make sure you discuss any questions you have with your health care provider. Document Released: 09/06/2005 Document Revised: 03/16/2017 Document Reviewed: 02/18/2016 Elsevier Interactive Patient Education  2019 [place]  LOW-CHOLESTEROL,  LOW-TRIGLYCERIDE DIETS    FOODS TO USE   MEATS, FISH Choose lean meats (chicken, [place], veal, and non-fatty cuts of beef with excess fat trimmed; one serving = 3 oz of cooked meat). Also, fresh or frozen fish, canned fish packed in water, and shellfish (lobster, crabs, shrimp, and oysters). Limit use to no more than one serving of one of these per week. Shellfish are high in cholesterol but low in saturated fat and should be used sparingly. Meats and fish should be broiled (pan or oven) or baked on a rack.  EGGS Egg substitutes and egg whites (use freely). Egg yolks (limit two per week).  FRUITS Eat three servings of fresh fruit per day (1 serving =  cup). Be sure to have at least one citrus fruit daily. Frozen and canned fruit with no sugar or syrup added may be used.  VEGETABLES Most vegetables are not limited (see next page). One dark-green (string beans, escarole) or one deep yellow (squash) vegetable is recommended daily. Cauliflower, broccoli, and celery, as well as potato skins, are recommended for their fiber content. (Fiber is associated with cholesterol reduction) It is preferable to steam vegetables, but they may be boiled, strained, or braised with polyunsaturated vegetable oil (see below).  BEANS Dried peas or beans (1 serving =  cup) may be used as a bread substitute.  NUTS Almonds, walnuts, and peanuts may be used sparingly  (1 serving = 1 Tablespoonful). Use pumpkin, sesame, or sunflower seeds.  BREADS, GRAINS One roll or one slice of whole grain or enriched bread may be used, or three soda crackers or four pieces of melba toast as a substitute. Spaghetti, rice or noodles ( cup) or  large ear of corn may be used as a bread substitute. In preparing these foods do not use butter or shortening, use soft margarine. Also use egg and sugar substitutes.  Choose high fiber grains, such as oats and whole wheat.  CEREALS Use  cup of hot cereal or  cup of cold cereal per day. Add a sugar  substitute if desired, with  99% fat free or skim milk.  MILK PRODUCTS Always use 99% fat free or skim milk, dairy products such as low fat cheeses (farmer's uncreamed diet cottage), low-fat yogurt, and powdered skim milk.  FATS, OILS Use soft (not stick) margarine; vegetable oils that are high in polyunsaturated fats (such as safflower, sunflower, soybean, corn, and cottonseed). Always refrigerate meat drippings to harden the fat and remove it before preparing gravies  DESSERTS, SNACKS Limit to two servings per day; substitute each serving for a bread/cereal serving: ice milk, water sherbet (1/4 cup); unflavored gelatin or gelatin flavored with sugar substitute (1/3 cup); pudding prepared with skim milk (1/2 cup); egg white souffls; unbuttered popcorn (1  cups). Substitute carob for chocolate.  BEVERAGES Fresh fruit juices (limit 4 oz per day); black coffee, plain or herbal teas; soft drinks with sugar substitutes; club soda, preferably salt-free; cocoa made with skim milk or nonfat dried milk and water (sugar substitute added if desired); clear broth. Alcohol: limit two servings per day (see second page).  MISCELLANEOUS  You may use the following freely: vinegar, spices, herbs, nonfat bouillon, mustard, Worcestershire sauce, soy sauce, flavoring essence.                  GUIDELINES FOR  LOW-CHOLESTEROL, LOW TRIGLYCERIDE DIETS    FOODS TO AVOID   MEATS, FISH Marbled beef, pork, bacon, sausage, and other pork products; fatty fowl (duck, goose); skin and fat of [place] and chicken; processed meats; luncheon meats (salami, bologna); frankfurters and fast-food hamburgers (theyre loaded with fat); organ meats (kidneys, liver); canned fish packed in oil.  EGGS Limit egg yolks to two per week.   FRUITS Coconuts (rich in saturated fats).  VEGETABLES Avoid avocados. Starchy vegetables (potatoes, corn, lima beans, dried peas, beans) may be used only if substitutes for a serving of bread or  cereal. (Baked potato skin, however, is desirable for its fiber content.  BEANS Commercial baked beans with sugar and/or pork added.  NUTS Avoid nuts.  Limit peanuts and walnuts to one tablespoonful per day.  BREADS, GRAINS Any baked goods with shortening and/or sugar. Commercial mixes with dried eggs and whole milk. Avoid sweet rolls, doughnuts, breakfast pastries (Danish), and sweetened packaged cereals (the added sugar converts readily to triglycerides).  MILK PRODUCTS Whole milk and whole-milk packaged goods; cream; ice cream; whole-milk puddings, yogurt, or cheeses; nondairy cream substitutes.  FATS, OILS Butter, lard, animal fats, bacon drippings, gravies, cream sauces as well as palm and coconut oils. All these are high in saturated fats. Examine labels on cholesterol free products for hydrogenated fats. (These are oils that have been hardened into solids and in the process have become saturated.)  DESSERTS, SNACKS Fried snack foods like potato chips; chocolate; candies in general; jams, jellies, syrups; whole- milk puddings; ice cream and milk sherbets; hydrogenated peanut butter.  BEVERAGES Sugared fruit juices and soft drinks; cocoa made with whole milk and/or sugar. When using alcohol (1 oz liquor, 5 oz beer, or 2  oz dry table wine per serving), one serving must be substituted for one bread or cereal serving (limit, two servings of alcohol per day).   SPECIAL NOTES    1. Remember that even non-limited foods should be used in moderation. 2. While on a cholesterol-lowering diet, be sure to avoid animal fats and marbled meats. 3. 3. While on a triglyceride-lowering diet, be sure to avoid sweets and to control the amount of carbohydrates you eat (starchy foods such as flour, bread, potatoes).While on a tri-glyceride-lowering  diet, be sure to avoid sweets 4. Buy a good low-fat cookbook, such as the one published by the American Heart Association. 5. Consult your physician if you have any  questions.               [facility] Low Glycemic Diet Plan   Low Glycemic Foods (20-49) Moderate Glycemic Foods (50-69) High Glycemic Foods (70-100)      Breakfast Creals Breakfast Cereals Breakfast Cereals  All Bran All-Bran Fruit'n Oats   Bran Buds Bran Chex   Cheerios Corn chex    Fiber One Oatmeal (not instant)   Just Right Mini-Wheats   Corn Flakes Cream of Wheat    Oat Bran Special K Swiss Muesli   Grape Nuts Grape Nut Flakes      Grits Nutri-Grain    Fruits and fruit juice: Fruits Puffed Rice Puffed Wheat    (Limit to 1-2 Servings per day) Banana (under-ride) Dates   Rice Chex Rice Krispies    Apples Apricots (fresh/dried)   Figs Grapes   Shredded Wheat Team    Blackberries Blueberries   Kiwi Mango   Total     Cherries Cranberries   Oranges Raisins     Peaches Pears    Fruits  Plums Prunes   Fruit Juices Pineapple Watermelon    Grapefruit Raspberries   Cranberry Juice Orange Juice   Banana (over-ripe)     Strawberries Tangerines      Apple Juice Grapefruit Juice   Beans and Legumes Beverages  Tomato Juice    [place]-type baked beans Sodas, sweet tea, pineapple juice   Canned pinto, kidney, or navy beans   Beans and Legumes (fresh-cooked) Green peas Vegetables  Black-eyed peas Butter Beans    Potato, baked, boiled, fried, mashed  Chick peas Lentils   Vegetables French fries  Green beans Lima beans   Beets Carrots   Canned or frozen corn  Kidney beans Navy beans   Sweet potato Yam   Parsnips  Pinto beans Snow peas   Corn on the cob Winter squash      Non-starchy vegetables Grains Breads  Asparagus, avocado, broccoli, cabbage Cornmeal Rice, brown   Most breads (white and whole grain)  cauliflower, celery, cucumber, greens Rice, white Couscous   Bagels Bread sticks    lettuce, mushrooms, peppers, tomatoes  Bread stuffing Kaiser roll    okra, onions, spinach, summer squash Pasta Dinner rolls   [employer], cheese      Grains Ravioli, meat filled Spaghetti, white   Grains  Barley Bulgur    Rice, instant Tapioca, with milk    Rye Wild rice   Nuts    Cashews Macadamia   Candy and most cookies  Nuts and oils    Almonds, peanuts, sunflower seeds Snacks Snacks  hazelnuts, pecans, walnuts Chocolate Ice cream, lowfat   Donuts Corn chips    Oils that are liquid at room temperature Muffin Popcorn   Jelly beans Pretzels      Pastries  Dairy, fish, meat, soy, and eggs    Milk, skim Lowfat cheese    Restaurant and ethnic foods  Yogurt, lowfat, fruit sugar sweetened  Most [place] food (sugar in stir fry    or wok sauce)  Lean red meat Fish    Teriyaki-style meats and vegetables  Skinless chicken and [place], shellfish        Egg whites (up to 3 daily), Soy Products    Egg yolks (up to 7 or _____ per week)

## 2018-12-06 NOTE — Progress Notes (Signed)
Date:  12/06/2018   Name:  Linda Vazquez   DOB:  01-21-1946   MRN:  [id number]   Chief Complaint: No chief complaint on file.  Diabetes  She presents for her follow-up diabetic visit. She has type 2 diabetes mellitus. Her disease course has been stable. There are no hypoglycemic associated symptoms. Pertinent negatives for hypoglycemia include no dizziness, headaches or nervousness/anxiousness. There are no diabetic associated symptoms. Pertinent negatives for diabetes include no blurred vision, no chest pain, no fatigue, no foot paresthesias, no foot ulcerations, no polydipsia, no polyphagia, no polyuria, no visual change, no weakness and no weight loss. There are no hypoglycemic complications. Symptoms are stable. There are no diabetic complications.    Review of Systems  Constitutional: Negative.  Negative for chills, fatigue, fever, unexpected weight change and weight loss.  HENT: Negative for congestion, ear discharge, ear pain, rhinorrhea, sinus pressure, sneezing and sore throat.   Eyes: Negative for blurred vision, photophobia, pain, discharge, redness and itching.  Respiratory: Negative for cough, shortness of breath, wheezing and stridor.   Cardiovascular: Negative for chest pain.  Gastrointestinal: Negative for abdominal pain, blood in stool, constipation, diarrhea, nausea and vomiting.  Endocrine: Negative for cold intolerance, heat intolerance, polydipsia, polyphagia and polyuria.  Genitourinary: Negative for dysuria, flank pain, frequency, hematuria, menstrual problem, pelvic pain, urgency, vaginal bleeding and vaginal discharge.  Musculoskeletal: Negative for arthralgias, back pain and myalgias.  Skin: Negative for rash.  Allergic/Immunologic: Negative for environmental allergies and food allergies.  Neurological: Negative for dizziness, weakness, light-headedness, numbness and headaches.  Hematological: Negative for adenopathy. Does not bruise/bleed easily.   Psychiatric/Behavioral: Negative for dysphoric mood. The patient is not nervous/anxious.     Patient Active Problem List   Diagnosis Date Noted  . Asthma, cough variant 05/29/2015  . 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 Chronic cough 10/31/2014  . Esophageal dysfunction 09/23/2014  . 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 BP (high blood pressure) 06/04/2013  . Dysphonia 03/01/2013  . Adult hypothyroidism 03/01/2013  . Malignant neoplasm of breast ([place]) 02/09/2013  . Acquired lymphedema 02/09/2013  . 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 Ace Inhibitors Cough    Per pt bp med caused cough possible ace inhibitor  . Sulfa Antibiotics Other (See Comments)    Other Reaction: Not Assessed    Past Surgical History:  Procedure Laterality Date  . BIOPSY THYROID     benign  . BREAST LUMPECTOMY Right   . COLONOSCOPY  2006   repeat in 10 yrs- [facility]  . KNEE ARTHROSCOPY WITH MENISCAL REPAIR Bilateral   . MASTECTOMY Left   . MASTECTOMY Right    2 yrs later  . MEDIASTINAL MASS EXCISION    . 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 TONSILLECTOMY    . tubal cauterization      Social History   Tobacco Use  . Smoking status: Never Smoker  . Smokeless tobacco: Never Used  . Tobacco comment: Smoking cessation materials not required  Substance Use Topics  . Alcohol use: Yes    Alcohol/week: 0.0 standard drinks    Comment: socially  . Drug use: No     Medication list has been reviewed and updated.  No outpatient medications have been marked as taking for the 12/06/18 encounter (Appointment) with Juline Patch, MD.    [facility] 2/9 Scores 11/03/2018 07/20/2018 07/20/2018 02/06/2018  PHQ - 2 Score 0 0  0 2  PHQ- 9 Score 0 0 - 5    Physical Exam Vitals signs and nursing note reviewed.  Constitutional:      General: She is not in acute distress.    Appearance: She is not diaphoretic.  HENT:     Head: Normocephalic and  atraumatic.     Right Ear: Tympanic membrane, ear canal and external ear normal.     Left Ear: Tympanic membrane, ear canal and external ear normal.     Nose: Nose normal.  Eyes:     General:        Right eye: No discharge.        Left eye: No discharge.     Conjunctiva/sclera: Conjunctivae normal.     Pupils: Pupils are equal, round, and reactive to light.  Neck:     Musculoskeletal: Normal range of motion and neck supple.     Thyroid: No thyromegaly.     Vascular: No JVD.  Cardiovascular:     Rate and Rhythm: Normal rate and regular rhythm.     Heart sounds: Normal heart sounds. No murmur. No friction rub. No gallop.   Pulmonary:     Effort: Pulmonary effort is normal.     Breath sounds: Normal breath sounds.  Abdominal:     General: Bowel sounds are normal.     Palpations: Abdomen is soft. There is no mass.     Tenderness: There is no abdominal tenderness. There is no guarding.  Musculoskeletal: Normal range of motion.  Lymphadenopathy:     Cervical: No cervical adenopathy.  Skin:    General: Skin is warm and dry.  Neurological:     Mental Status: She is alert.     Deep Tendon Reflexes: Reflexes are normal and symmetric.     Wt Readings from Last 3 Encounters:  11/03/18 200 lb (90.7 kg)  09/01/18 208 lb (94.3 kg)  07/31/18 206 lb (93.4 kg)    There were no vitals taken for this visit.  Assessment and Plan: 1. New onset type 2 diabetes mellitus ([place]) New onset patient recently was started on metformin 500 twice a day however she could not tolerate because of diarrhea.  Therefore patient was changed over to 750 XL she is able to tolerate well.  Sugars are starting to run in the 1 60-1 40 range fasting.  We will continue on this present dosing and we have now decided to pursue education in the dietary range so referral to lifestyle at [place] was put in place.  She needs to return in 6 weeks at which time we will do an A1c. - Ambulatory referral to diabetic education -  metFORMIN (GLUCOPHAGE-XR) 750 MG 24 hr tablet; TAKE 1 TABLET BY MOUTH EVERY DAY WITH BREAKFAST  Dispense: 30 tablet; Refill: 5

## 2018-12-13 DIAGNOSIS — M7581 Other shoulder lesions, right shoulder: Secondary | ICD-10-CM | POA: Insufficient documentation

## 2018-12-13 DIAGNOSIS — M75121 Complete rotator cuff tear or rupture of right shoulder, not specified as traumatic: Secondary | ICD-10-CM | POA: Insufficient documentation

## 2018-12-18 ENCOUNTER — Other Ambulatory Visit: Payer: Self-pay

## 2018-12-18 ENCOUNTER — Encounter: Payer: Self-pay | Admitting: *Deleted

## 2018-12-18 ENCOUNTER — Encounter: Payer: Medicare Other | Attending: Family Medicine | Admitting: *Deleted

## 2018-12-18 VITALS — BP 130/80 | Ht 62.0 in | Wt 201.1 lb

## 2018-12-18 DIAGNOSIS — E119 Type 2 diabetes mellitus without complications: Secondary | ICD-10-CM | POA: Insufficient documentation

## 2018-12-18 DIAGNOSIS — Z713 Dietary counseling and surveillance: Secondary | ICD-10-CM | POA: Diagnosis not present

## 2018-12-18 DIAGNOSIS — Z6836 Body mass index (BMI) 36.0-36.9, adult: Secondary | ICD-10-CM | POA: Insufficient documentation

## 2018-12-18 NOTE — Patient Instructions (Signed)
Check blood sugars 1 x day before breakfast or 2 hrs after one meal every day Bring blood sugar records to the next appointment/class  Exercise: Continue walking for  30 minutes  1-2 days a week and gradually increase as tolerated  Eat 3 meals day, 1-2 snacks a day Space meals 4-6 hours apart Don't skip meals Limit desserts and sweets  Return for classes on:

## 2018-12-18 NOTE — Progress Notes (Signed)
Diabetes Self-Management Education  Visit Type: First/Initial  Appt. Start Time: 0925 Appt. End Time: 5974  12/18/2018  [name], identified by name and date of birth, is a 73 y.o. female with a diagnosis of Diabetes: Type 2.   ASSESSMENT  Blood pressure 130/80, height 5\' 2"  (1.575 m), weight 201 lb 1.6 oz (91.2 kg). Body mass index is 36.78 kg/m.  Diabetes Self-Management Education - 12/18/18 1120      Visit Information   Visit Type  First/Initial      Initial Visit   Diabetes Type  Type 2    Are you currently following a meal plan?  Yes    What type of meal plan do you follow?  weight watchers    Are you taking your medications as prescribed?  Yes    Date Diagnosed  6 weeks ago      Health Coping   How would you rate your overall health?  Excellent      Psychosocial Assessment   Patient Belief/Attitude about Diabetes  Other (comment)   "concerned"   Self-care barriers  None    Self-management support  Doctor's office;Friends;Family    Patient Concerns  Nutrition/Meal planning;Medication;Monitoring;Healthy Lifestyle;Problem Solving;Glycemic Control;Weight Control    Special Needs  None    Preferred Learning Style  Auditory;Visual;Hands on    Learning Readiness  Ready    How often do you need to have someone help you when you read instructions, pamphlets, or other written materials from your doctor or pharmacy?  1 - Never    What is the last grade level you completed in school?  masters      Pre-Education Assessment   Patient understands the diabetes disease and treatment process.  Needs Instruction    Patient understands incorporating nutritional management into lifestyle.  Needs Instruction    Patient undertands incorporating physical activity into lifestyle.  Needs Instruction    Patient understands using medications safely.  Needs Instruction    Patient understands monitoring blood glucose, interpreting and using results  Needs Review    Patient  understands prevention, detection, and treatment of acute complications.  Needs Instruction    Patient understands prevention, detection, and treatment of chronic complications.  Needs Instruction    Patient understands how to develop strategies to address psychosocial issues.  Needs Instruction    Patient understands how to develop strategies to promote health/change behavior.  Needs Instruction      Complications   Last HgB A1C per patient/outside source  8.4 %   11/03/18   How often do you check your blood sugar?  1-2 times/day    Fasting Blood glucose range (mg/dL)  130-179;180-200   FBG's 130-195 mg/dL   Postprandial Blood glucose range (mg/dL)  130-179;180-200;>200   pp's 132-256 mg/dL   Have you had a dilated eye exam in the past 12 months?  Yes    Have you had a dental exam in the past 12 months?  Yes    Are you checking your feet?  Yes    How many days per week are you checking your feet?  7      Dietary Intake   Breakfast  English muffin with eggs, bacon; Special K cereal, 2% milk and orange    Lunch  sometimes skips or could be 3-4 pm - grilled meat (steak, chicken, beef, pork) with potatoes, bread, pasta, corn, green beans, broccoli, cauliflower, carrots, lettuce, tomatoes    Dinner  taco soup, sandwich, breakfast foods  Snack (evening)  popcorn, chips    Beverage(s)  water, unsweetened tea, diet soda      Exercise   Exercise Type  Light (walking / raking leaves)    How many days per week to you exercise?  2    How many minutes per day do you exercise?  30    Total minutes per week of exercise  60      Patient Education   Previous Diabetes Education  No    Disease state   Definition of diabetes, type 1 and 2, and the diagnosis of diabetes;Factors that contribute to the development of diabetes    Nutrition management   Role of diet in the treatment of diabetes and the relationship between the three main macronutrients and blood glucose level;Food label reading, portion  sizes and measuring food.;Reviewed blood glucose goals for pre and post meals and how to evaluate the patients' food intake on their blood glucose level.    Physical activity and exercise   Role of exercise on diabetes management, blood pressure control and cardiac health.    Medications  Reviewed patients medication for diabetes, action, purpose, timing of dose and side effects.    Monitoring  Purpose and frequency of SMBG.;Taught/discussed recording of test results and interpretation of SMBG.;Identified appropriate SMBG and/or A1C goals.    Chronic complications  Relationship between chronic complications and blood glucose control    Psychosocial adjustment  Role of stress on diabetes;Identified and addressed patients feelings and concerns about diabetes      Individualized Goals (developed by patient)   Reducing Risk  Improve blood sugars Decrease medications Prevent diabetes complications Lose weight Lead a healthier lifestyle Become more fit     Outcomes   Expected Outcomes  Demonstrated interest in learning. Expect positive outcomes    Future DMSE  4-6 wks       Individualized Plan for Diabetes Self-Management Training:   Learning Objective:  Patient will have a greater understanding of diabetes self-management. Patient education plan is to attend individual and/or group sessions per assessed needs and concerns.   Plan:   Patient Instructions  Check blood sugars 1 x day before breakfast or 2 hrs after one meal every day Bring blood sugar records to the next appointment/class Exercise: Continue walking for  30 minutes  1-2 days a week and gradually increase as tolerated Eat 3 meals day, 1-2 snacks a day Space meals 4-6 hours apart Don't skip meals Limit desserts and sweets  Expected Outcomes:  Demonstrated interest in learning. Expect positive outcomes  Education material provided:  General Meal Planning Guidelines Simple Meal Plan  If problems or questions, patient to  contact team via:   Johny Drilling, RN, [place], CDE [phone]  Future DSME appointment: 4-6 wks  January 18, 2019 for Diabetes Class 1

## 2019-01-17 ENCOUNTER — Encounter: Payer: Self-pay | Admitting: Family Medicine

## 2019-01-17 ENCOUNTER — Other Ambulatory Visit: Payer: Self-pay

## 2019-01-17 ENCOUNTER — Ambulatory Visit: Payer: Medicare Other | Admitting: Family Medicine

## 2019-01-17 VITALS — BP 120/62 | HR 68 | Ht 62.0 in | Wt 196.0 lb

## 2019-01-17 DIAGNOSIS — C50911 Malignant neoplasm of unspecified site of right female breast: Secondary | ICD-10-CM

## 2019-01-17 DIAGNOSIS — J301 Allergic rhinitis due to pollen: Secondary | ICD-10-CM

## 2019-01-17 DIAGNOSIS — J45991 Cough variant asthma: Secondary | ICD-10-CM

## 2019-01-17 DIAGNOSIS — E119 Type 2 diabetes mellitus without complications: Secondary | ICD-10-CM | POA: Diagnosis not present

## 2019-01-17 DIAGNOSIS — C50912 Malignant neoplasm of unspecified site of left female breast: Secondary | ICD-10-CM

## 2019-01-17 DIAGNOSIS — Z170 Estrogen receptor positive status [ER+]: Secondary | ICD-10-CM

## 2019-01-17 DIAGNOSIS — E7801 Familial hypercholesterolemia: Secondary | ICD-10-CM

## 2019-01-17 MED ORDER — MONTELUKAST SODIUM 10 MG PO TABS: 10.0000 mg | ORAL_TABLET | Freq: Every day | ORAL | 3 refills | Status: DC

## 2019-01-17 MED ORDER — FLUTICASONE PROPIONATE 50 MCG/ACT NA SUSP: 2.0000 | Freq: Every day | NASAL | 6 refills | Status: DC

## 2019-01-17 MED ORDER — METFORMIN HCL ER 750 MG PO TB24: 750.0000 mg | ORAL_TABLET | Freq: Every day | ORAL | 1 refills | Status: DC

## 2019-01-17 NOTE — Progress Notes (Signed)
Date:  01/17/2019   Name:  Linda Vazquez   DOB:  Aug 17, 1946   MRN:  [id number]   Chief Complaint: Diabetes (follow up after starting metformin. ) and Allergic Rhinitis  (needs refill on singulair)  Diabetes  She presents for her follow-up diabetic visit. She has type 2 diabetes mellitus. Her disease course has been stable. There are no hypoglycemic associated symptoms. Pertinent negatives for hypoglycemia include no confusion, dizziness, headaches, hunger, mood changes, nervousness/anxiousness, pallor, seizures, sleepiness, speech difficulty, sweats or tremors. Associated symptoms include weight loss. Pertinent negatives for diabetes include no blurred vision, no chest pain, no fatigue, no foot paresthesias, no foot ulcerations, no polydipsia, no polyphagia, no polyuria, no visual change and no weakness. (Just frequency/) There are no hypoglycemic complications. Symptoms are stable. There are no diabetic complications. There are no known risk factors for coronary artery disease. Current diabetic treatment includes oral agent (monotherapy). She is compliant with treatment some of the time. Her weight is decreasing steadily. She is following a generally healthy diet. When asked about meal planning, she reported none. She participates in exercise daily. There is no change in her home blood glucose trend. Her breakfast blood glucose is taken between 8-9 am. Her breakfast blood glucose range is generally 110-130 mg/dl. An ACE inhibitor/angiotensin II receptor blocker is not being taken. She does not see a podiatrist.Eye exam is not current.  URI   This is a chronic (for allergic rhinitis) problem. The current episode started more than 1 year ago. The problem has been waxing and waning. There has been no fever. Associated symptoms include a plugged ear sensation, rhinorrhea and sneezing. Pertinent negatives include no abdominal pain, chest pain, congestion, coughing, diarrhea, dysuria, ear pain, headaches,  joint pain, joint swelling, nausea, neck pain, rash, sinus pain, sore throat, swollen glands, vomiting or wheezing. Treatments tried: astelin/will resume singulair. The treatment provided mild relief.  Hyperlipidemia  This is a chronic problem. The current episode started more than 1 year ago. Recent lipid tests were reviewed and are normal. Exacerbating diseases include diabetes. She has no history of chronic renal disease, hypothyroidism, liver disease, obesity or nephrotic syndrome. There are no known factors aggravating her hyperlipidemia. Pertinent negatives include no chest pain, focal sensory loss, focal weakness, leg pain, myalgias or shortness of breath. Current antihyperlipidemic treatment includes diet change. The current treatment provides moderate improvement of lipids. There are no compliance problems.  Risk factors for coronary artery disease include dyslipidemia, diabetes mellitus and hypertension.    Review of Systems  Constitutional: Positive for weight loss. Negative for chills, fatigue, fever and unexpected weight change.  HENT: Positive for postnasal drip, rhinorrhea and sneezing. Negative for congestion, ear discharge, ear pain, sinus pressure, sinus pain and sore throat.        Hoarseness  Eyes: Negative for blurred vision, photophobia, pain, discharge, redness and itching.  Respiratory: Negative for cough, shortness of breath, wheezing and stridor.   Cardiovascular: Negative for chest pain.  Gastrointestinal: Negative for abdominal pain, blood in stool, constipation, diarrhea, nausea and vomiting.  Endocrine: Negative for cold intolerance, heat intolerance, polydipsia, polyphagia and polyuria.  Genitourinary: Negative for dysuria, flank pain, frequency, hematuria, menstrual problem, pelvic pain, urgency, vaginal bleeding and vaginal discharge.  Musculoskeletal: Negative for arthralgias, back pain, joint pain, myalgias and neck pain.  Skin: Negative for pallor and rash.   Allergic/Immunologic: Negative for environmental allergies and food allergies.  Neurological: Negative for dizziness, tremors, focal weakness, seizures, speech difficulty, weakness, light-headedness, numbness and  headaches.  Hematological: Negative for adenopathy. Does not bruise/bleed easily.  Psychiatric/Behavioral: Negative for confusion and dysphoric mood. The patient is not nervous/anxious.     Patient Active Problem List   Diagnosis Date Noted  . Asthma, cough variant 05/29/2015  . 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 Chronic cough 10/31/2014  . Esophageal dysfunction 09/23/2014  . 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 BP (high blood pressure) 06/04/2013  . Dysphonia 03/01/2013  . Adult hypothyroidism 03/01/2013  . Malignant neoplasm of breast ([place]) 02/09/2013  . Acquired lymphedema 02/09/2013  . 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 Ace Inhibitors Cough    Per pt bp med caused cough possible ace inhibitor  . Sulfa Antibiotics Rash    Other Reaction: Not Assessed    Past Surgical History:  Procedure Laterality Date  . BIOPSY THYROID     benign  . BREAST LUMPECTOMY Right   . COLONOSCOPY  2006   repeat in 10 yrs- [facility]  . KNEE ARTHROSCOPY WITH MENISCAL REPAIR Bilateral   . MASTECTOMY Left   . MASTECTOMY Right    2 yrs later  . MEDIASTINAL MASS EXCISION    . 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 TONSILLECTOMY    . tubal cauterization      Social History   Tobacco Use  . Smoking status: Never Smoker  . Smokeless tobacco: Never Used  . Tobacco comment: Smoking cessation materials not required  Substance Use Topics  . Alcohol use: Yes    Alcohol/week: 0.0 standard drinks    Comment: socially  . Drug use: No     Medication list has been reviewed and updated.  Current Meds  Medication Sig  . albuterol (PROVENTIL HFA;VENTOLIN HFA) 108 (90 Base) MCG/ACT inhaler Inhale 2 puffs into the lungs every 6  (six) hours as needed for wheezing or shortness of breath.  Marland Kitchen amitriptyline (ELAVIL) 10 MG tablet Take 1 tablet by mouth at bedtime as needed. Dr Rodolph Bong  . aspirin EC 81 MG tablet Take 1 tablet by mouth daily.  Marland Kitchen azelastine (ASTELIN) 0.1 % nasal spray Place 1 spray into the nose 2 (two) times daily. [facility]  . benzonatate (TESSALON) 100 MG capsule TAKE 1 CAPSULE (100 MG TOTAL) BY MOUTH 2 (TWO) TIMES DAILY AS NEEDED- [facility]  . carbamide peroxide (DEBROX) 6.5 % OTIC solution Place 5 drops into both ears 2 (two) times daily. (Patient taking differently: Place 5 drops into both ears 2 (two) times daily as needed. )  . cetirizine-pseudoephedrine (ZYRTEC-D) 5-120 MG tablet Take 1 tablet by mouth daily. OTC  . clonazePAM (KLONOPIN) 0.5 MG tablet Take 0.5 mg by mouth daily. Otolaryn. DR Patrice Paradise  . hydrochlorothiazide (HYDRODIURIL) 25 MG tablet Take 1 tablet (25 mg total) by mouth daily.  Marland Kitchen levothyroxine (SYNTHROID, LEVOTHROID) 50 MCG tablet Take 1 tablet (50 mcg total) by mouth daily.  . metFORMIN (GLUCOPHAGE-XR) 750 MG 24 hr tablet TAKE 1 TABLET BY MOUTH EVERY DAY WITH BREAKFAST (Patient taking differently: Take 750 mg by mouth at bedtime. TAKE 1 TABLET BY MOUTH EVERY DAY WITH supper)  . mometasone-formoterol (DULERA) 200-5 MCG/ACT AERO Inhale 2 puffs into the lungs daily. [facility]  . montelukast (SINGULAIR) 10 MG tablet Take 1 tablet (10 mg total) by mouth at bedtime.  Marland Kitchen omeprazole (PRILOSEC) 40 MG capsule Take 40 mg by mouth daily.    PHQ 2/9 Scores 12/18/2018 11/03/2018 07/20/2018 07/20/2018  PHQ -  2 Score 0 0 0 0  PHQ- 9 Score - 0 0 -    BP Readings from Last 3 Encounters:  01/17/19 120/62  12/18/18 130/80  12/06/18 128/84    Physical Exam Vitals signs and nursing note reviewed.  Constitutional:      General: She is not in acute distress.    Appearance: She is not diaphoretic.  HENT:     Head: Normocephalic and atraumatic.     Right Ear: Tympanic membrane, ear canal and external  ear normal.     Left Ear: Tympanic membrane, ear canal and external ear normal.     Nose: Nose normal. No congestion or rhinorrhea.     Mouth/Throat:     Mouth: Mucous membranes are moist.  Eyes:     General:        Right eye: No discharge.        Left eye: No discharge.     Conjunctiva/sclera: Conjunctivae normal.     Pupils: Pupils are equal, round, and reactive to light.  Neck:     Musculoskeletal: Normal range of motion and neck supple.     Thyroid: No thyromegaly.     Vascular: No JVD.  Cardiovascular:     Rate and Rhythm: Normal rate and regular rhythm.     Pulses:          Dorsalis pedis pulses are 2+ on the right side and 2+ on the left side.       Posterior tibial pulses are 2+ on the right side and 2+ on the left side.     Heart sounds: Normal heart sounds. No murmur. No friction rub. No gallop.   Pulmonary:     Effort: Pulmonary effort is normal.     Breath sounds: Normal breath sounds. No wheezing, rhonchi or rales.  Chest:     Chest wall: No tenderness.  Abdominal:     General: Bowel sounds are normal.     Palpations: Abdomen is soft. There is no mass.     Tenderness: There is no abdominal tenderness. There is no guarding.  Musculoskeletal: Normal range of motion.     Right foot: No deformity or bunion.     Left foot: No deformity or bunion.  Feet:     Right foot:     Protective Sensation: 10 sites tested. 10 sites sensed.     Skin integrity: Dry skin present. No ulcer or blister.     Toenail Condition: Right toenails are normal.     Left foot:     Protective Sensation: 10 sites tested. 10 sites sensed.     Skin integrity: Dry skin present. No ulcer or blister.     Toenail Condition: Left toenails are normal.  Lymphadenopathy:     Cervical: No cervical adenopathy.  Skin:    General: Skin is warm and dry.  Neurological:     General: No focal deficit present.     Mental Status: She is alert.     Deep Tendon Reflexes: Reflexes are normal and symmetric.      Wt Readings from Last 3 Encounters:  01/17/19 196 lb (88.9 kg)  12/18/18 201 lb 1.6 oz (91.2 kg)  12/06/18 200 lb (90.7 kg)    BP 120/62   Pulse 68   Ht 5\' 2"  (1.575 m)   Wt 196 lb (88.9 kg)   BMI 35.85 kg/m   Assessment and Plan:  1. New onset type 2 diabetes mellitus ([place]) Patient is a relatively new  diabetic.  Patient is doing extremely well with her dietary approach as well is now able to tolerate the Exar Glucophage without diarrhea for which we will continue to 750 mg XR once a day.  We will check an A1c today along with microalbuminuria. - Hemoglobin A1c - Microalbumin / creatinine urine ratio - metFORMIN (GLUCOPHAGE-XR) 750 MG 24 hr tablet; Take 1 tablet (750 mg total) by mouth at bedtime. TAKE 1 TABLET BY MOUTH EVERY DAY WITH supper  Dispense: 90 tablet; Refill: 1 - Lipid panel  2. 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  She desires to have her Singulair to be refilled for which she is taking 10 mg once a day.  This generally at bedtime I have also suggested that in addition to her Astelin that she may take in the morning that she may want to take fluticasone spray in her nostril at night.  Patient in the meantime we will continue her Zyrtec-D. - montelukast (SINGULAIR) 10 MG tablet; Take 1 tablet (10 mg total) by mouth at bedtime.  Dispense: 90 tablet; Refill: 3 - fluticasone (FLONASE) 50 MCG/ACT nasal spray; Place 2 sprays into both nostrils daily.  Dispense: 16 g; Refill: 6  3. Asthma, cough variant Patient does have an asthma that is episodic for which she takes Singulair and has encouraged during the pollen season to do so. - montelukast (SINGULAIR) 10 MG tablet; Take 1 tablet (10 mg total) by mouth at bedtime.  Dispense: 90 tablet; Refill: 3  4. Bilateral malignant neoplasm of breast in female, estrogen receptor positive, unspecified site of breast [facility]) And has a history of breast  cancer which she is followed at [facility] and she will continue to do so patient has not noted any issues with posttreatment at this time.  5. Familial hypercholesterolemia Chronic.  Controlled.  Patient is currently controlled on diet but will check lipid panel to make sure this statin is not too elevated would prefer not to add another medication to her regimen if we can do so. - Lipid panel

## 2019-01-17 NOTE — Patient Instructions (Signed)

## 2019-01-18 ENCOUNTER — Encounter: Payer: Medicare Other | Attending: Family Medicine | Admitting: Dietician

## 2019-01-18 ENCOUNTER — Encounter: Payer: Self-pay | Admitting: Dietician

## 2019-01-18 VITALS — Wt 198.5 lb

## 2019-01-18 DIAGNOSIS — Z713 Dietary counseling and surveillance: Secondary | ICD-10-CM | POA: Diagnosis present

## 2019-01-18 DIAGNOSIS — E119 Type 2 diabetes mellitus without complications: Secondary | ICD-10-CM | POA: Diagnosis not present

## 2019-01-18 DIAGNOSIS — Z6836 Body mass index (BMI) 36.0-36.9, adult: Secondary | ICD-10-CM | POA: Insufficient documentation

## 2019-01-18 LAB — LIPID PANEL
Chol/HDL Ratio: 3 ratio (ref 0.0–4.4)
Cholesterol, Total: 201 mg/dL — ABNORMAL HIGH (ref 100–199)
HDL: 66 mg/dL (ref >39)
LDL Calculated: 111 mg/dL — ABNORMAL HIGH (ref 0–99)
Triglycerides: 119 mg/dL (ref 0–149)
VLDL Cholesterol Cal: 24 mg/dL (ref 5–40)

## 2019-01-18 LAB — MICROALBUMIN / CREATININE URINE RATIO
Creatinine, Urine: 203.4 mg/dL
Microalb/Creat Ratio: 11 mg/g creat (ref 0–29)
Microalbumin, Urine: 22 ug/mL

## 2019-01-18 LAB — HEMOGLOBIN A1C
Est. average glucose Bld gHb Est-mCnc: 163 mg/dL
Hgb A1c MFr Bld: 7.3 % — ABNORMAL HIGH (ref 4.8–5.6)

## 2019-01-18 NOTE — Progress Notes (Signed)
Appt. Start Time: 8:50  Appt. End Time: 11:30 am  Class 1 Diabetes Overview - define DM; state own type of DM; identify functions of pancreas and insulin; define insulin deficiency vs insulin resistance  Psychosocial - identify DM as a source of stress; state the effects of stress on BG control  Nutritional Management - describe effects of food on blood glucose; identify sources of carbohydrate, protein and fat; verbalize the importance of balance meals in controlling blood glucose  Exercise - describe the effects of exercise on blood glucose and importance of regular exercise in controlling diabetes; state a plan for personal exercise; verbalize contraindications for exercise  Self-Monitoring - state importance of SMBG; use SMBG results to effectively manage diabetes; identify importance of regular HbA1C testing and goals for results  Acute Complications - recognize hyperglycemia and hypoglycemia with causes and effects; identify blood glucose results as high, low or in control; list steps in treating and preventing high and low blood glucose  Chronic Complications/Foot, Skin, Eye Dental Care - identify possible long-term complications of diabetes (retinopathy, neuropathy, nephropathy, cardiovascular disease, infections); state importance of daily self-foot exams; describe how to examine feet and what to look for; explain appropriate eye and dental care  Lifestyle Changes/Goals & Health/Community Resources - state benefits of making appropriate lifestyle changes; identify habits that need to change (meals, tobacco, alcohol); identify strategies to reduce risk factors for personal health  Pregnancy/Sexual Health - define gestational diabetes; state importance of good blood glucose control and birth control prior to pregnancy; state importance of good blood glucose control in preventing sexual problems (impotence, vaginal dryness, infections, loss of desire); state relationship of blood glucose  control and pregnancy outcome; describe risk of maternal and fetal complications  Teaching Materials Used: Class 1 Slides/Notebook Diabetes Booklet ID Card  Medic Alert/Medic ID Forms Sleep Evaluation Exercise Handout Planning a Balanced Meal Goals for Class 1

## 2019-01-25 ENCOUNTER — Other Ambulatory Visit: Payer: Self-pay

## 2019-01-25 ENCOUNTER — Encounter: Payer: Self-pay | Admitting: *Deleted

## 2019-01-25 ENCOUNTER — Encounter: Payer: Medicare Other | Attending: Family Medicine | Admitting: *Deleted

## 2019-01-25 VITALS — Wt 196.7 lb

## 2019-01-25 DIAGNOSIS — Z713 Dietary counseling and surveillance: Secondary | ICD-10-CM | POA: Insufficient documentation

## 2019-01-25 DIAGNOSIS — Z6836 Body mass index (BMI) 36.0-36.9, adult: Secondary | ICD-10-CM | POA: Diagnosis not present

## 2019-01-25 DIAGNOSIS — E119 Type 2 diabetes mellitus without complications: Secondary | ICD-10-CM | POA: Diagnosis not present

## 2019-01-25 NOTE — Progress Notes (Signed)

## 2019-02-01 ENCOUNTER — Encounter: Payer: Medicare Other | Admitting: Dietician

## 2019-02-01 ENCOUNTER — Other Ambulatory Visit: Payer: Self-pay

## 2019-02-01 ENCOUNTER — Encounter: Payer: Self-pay | Admitting: Dietician

## 2019-02-01 VITALS — BP 126/80 | Wt 197.6 lb

## 2019-02-01 DIAGNOSIS — E119 Type 2 diabetes mellitus without complications: Secondary | ICD-10-CM

## 2019-02-01 DIAGNOSIS — Z713 Dietary counseling and surveillance: Secondary | ICD-10-CM | POA: Diagnosis not present

## 2019-02-01 NOTE — Progress Notes (Signed)
Appt. Start Time: 9:00am Appt. End Time: 12:00  Class 3 Diabetes Overview - identify functions of pancreas and insulin; define insulin deficiency vs insulin resistance  Medications - state name, dose, timing of currently prescribed medications; describe types of medications available for diabetes  Psychosocial - identify DM as a source of stress; state the effects of stress on BG control; verbalize appropriate stress management techniques; identify personal stress issues   Nutritional Management - use food labels to identify serving size, content of carbohydrate, fiber, protein, fat, saturated fat and sodium; recognize food sources of fat, saturated fat, trans fat, and sodium, and verbalize goals for intake; describe healthful, appropriate food choices when dining out   Exercise - state a plan for personal exercise; verbalize contraindications for exercise  Self-Monitoring - state importance of SMBG; use SMBG results to effectively manage diabetes; identify importance of regular HbA1C testing and goals for results  Acute Complications - recognize hyperglycemia and hypoglycemia with causes and effects; identify blood glucose results as high, low or in control; list steps in treating and preventing high and low blood glucose  Chronic Complications - state importance of daily self-foot exams; explain appropriate eye and dental care  Lifestyle Changes/Goals & Health/Community Resources - set goals for proper diabetes care; state need for and frequency of healthcare follow-up; describe appropriate community resources for good health (ADA, web sites, apps)   Teaching Materials Used: Class 3 Slide Packet Diabetes Stress Test Stress Management Tools Stress Poem Goal Setting Worksheet Website/App List    

## 2019-02-05 ENCOUNTER — Encounter: Payer: Self-pay | Admitting: *Deleted

## 2019-02-07 ENCOUNTER — Ambulatory Visit (INDEPENDENT_AMBULATORY_CARE_PROVIDER_SITE_OTHER): Payer: Medicare Other

## 2019-02-07 VITALS — BP 134/82 | HR 65 | Ht 62.0 in | Wt 193.0 lb

## 2019-02-07 DIAGNOSIS — Z0000 Encounter for general adult medical examination without abnormal findings: Secondary | ICD-10-CM | POA: Diagnosis not present

## 2019-02-07 NOTE — Progress Notes (Signed)
Subjective:   Linda Vazquez is a 73 y.o. female who presents for Medicare Annual (Subsequent) preventive examination.   Virtual Visit via Telephone Note  I connected with Sharmon Revere on 02/07/19 at  8:00 AM EDT by telephone and verified that I am speaking with the correct person using two identifiers.  Medicare Annual Wellness visit completed telephonically due to Covid-19 pandemic.   Location: Patient: home Provider: office   I discussed the limitations, risks, security and privacy concerns of performing an evaluation and management service by telephone and the availability of in person appointments. The patient expressed understanding and agreed to proceed.  Some vital signs may be absent or patient reported.    Clemetine Marker, LPN  Review of Systems:   Cardiac Risk Factors include: advanced age (>57men, >77 women);diabetes mellitus;hypertension;obesity (BMI >30kg/m2)     Objective:     Vitals: BP 134/82   Pulse 65   Ht 5\' 2"  (1.575 m)   Wt 193 lb (87.5 kg)   BMI 35.30 kg/m   Body mass index is 35.3 kg/m.  Advanced Directives 02/07/2019 12/18/2018 02/06/2018 11/06/2015  Does Patient Have a Medical Advance Directive? Yes Yes Yes Yes  Type of Advance Directive Living will;Healthcare Power of Attorney Living will;Healthcare Power of [place];Living will [place];Living will  Does patient want to make changes to medical advance directive? - No - Patient declined - -  Copy of [place] in Chart? No - copy requested - No - copy requested No - copy requested    Tobacco Social History   Tobacco Use  Smoking Status Never Smoker  Smokeless Tobacco Never Used  Tobacco Comment   Smoking cessation materials not required     Counseling given: Not Answered Comment: Smoking cessation materials not required   Clinical Intake:  Pre-visit preparation completed: Yes  Pain : No/denies pain     BMI -  recorded: 35.3 Nutritional Status: BMI > 30  Obese Nutritional Risks: None Diabetes: Yes CBG done?: No Did pt. bring in CBG monitor from home?: No   Nutrition Risk Assessment:  Has the patient had any N/V/D within the last 2 months?  No  Does the patient have any non-healing wounds?  No  Has the patient had any unintentional weight loss or weight gain?  No   Diabetes:  Is the patient diabetic?  Yes  If diabetic, was a CBG obtained today?  No  Did the patient bring in their glucometer from home?  No  How often do you monitor your CBG's? 2-3 times daily.   Financial Strains and Diabetes Management:  Are you having any financial strains with the device, your supplies or your medication? No .  Does the patient want to be seen by Chronic Care Management for management of their diabetes?  No  Would the patient like to be referred to a Nutritionist or for Diabetic Management?  No  recently completed diabetic education  Diabetic Exams:  Diabetic Eye Exam: scheduled for 02/28/19 with Dr. Glennon Mac at [facility]  Diabetic Foot Exam: Completed 01/17/19.   How often do you need to have someone help you when you read instructions, pamphlets, or other written materials from your doctor or pharmacy?: 1 - Never  Interpreter Needed?: No  Information entered by :: Clemetine Marker LPN  Past Medical History:  Diagnosis Date  . Allergy   . Asthma   . Breast cancer ([place])   . Cancer (  [place])   . Diabetes mellitus without complication ([place])   . GERD (gastroesophageal reflux disease)   . Hypertension   . Melanoma ([place])   . Thyroid disease    Past Surgical History:  Procedure Laterality Date  . BIOPSY THYROID     benign  . BREAST LUMPECTOMY Right   . COLONOSCOPY  2006   repeat in 10 yrs- [facility] Dr  . KNEE ARTHROSCOPY WITH MENISCAL REPAIR Bilateral   . MASTECTOMY Left   . MASTECTOMY Right    2 yrs later  . MEDIASTINAL MASS EXCISION    . SKIN CANCER EXCISION     melanoma- stripped  nodes on L) side  . TONSILLECTOMY    . tubal cauterization     Family History  Problem Relation Age of Onset  . Hypertension Mother   . Heart disease Mother   . Heart disease Father   . Diabetes Brother   . Diabetes Paternal Uncle   . Diabetes Brother    Social History   Socioeconomic History  . Marital status: Married    Spouse name: Not on file  . Number of children: 0  . Years of education: Not on file  . Highest education level: Master's degree (e.g., MA, MS, MEng, MEd, MSW, MBA)  Occupational History  . Occupation: Retired  Scientific laboratory technician  . Financial resource strain: Not hard at all  . Food insecurity:    Worry: Never true    Inability: Never true  . Transportation needs:    Medical: No    Non-medical: No  Tobacco Use  . Smoking status: Never Smoker  . Smokeless tobacco: Never Used  . Tobacco comment: Smoking cessation materials not required  Substance and Sexual Activity  . Alcohol use: Yes    Alcohol/week: 0.0 standard drinks    Comment: socially 1-2 x month  . Drug use: No  . Sexual activity: Not Currently  Lifestyle  . Physical activity:    Days per week: 7 days    Minutes per session: 30 min  . Stress: Only a little  Relationships  . Social connections:    Talks on phone: More than three times a week    Gets together: Three times a week    Attends religious service: More than 4 times per year    Active member of club or organization: Yes    Attends meetings of clubs or organizations: More than 4 times per year    Relationship status: Married  Other Topics Concern  . Not on file  Social History Narrative  . Not on file    Outpatient Encounter Medications as of 02/07/2019  Medication Sig  . aspirin EC 81 MG tablet Take 1 tablet by mouth daily.  Marland Kitchen azelastine (ASTELIN) 0.1 % nasal spray Place 1 spray into the nose 2 (two) times daily. [facility]  . benzonatate (TESSALON) 100 MG capsule TAKE 1 CAPSULE (100 MG TOTAL) BY MOUTH 2 (TWO) TIMES DAILY AS  NEEDED- [facility]  . carbamide peroxide (DEBROX) 6.5 % OTIC solution Place 5 drops into both ears 2 (two) times daily. (Patient taking differently: Place 5 drops into both ears 2 (two) times daily as needed. )  . cetirizine-pseudoephedrine (ZYRTEC-D) 5-120 MG tablet Take 1 tablet by mouth daily. OTC  . clonazePAM (KLONOPIN) 0.5 MG tablet Take 0.5 mg by mouth daily. Otolaryn. DR Patrice Paradise  . DULERA 100-5 MCG/ACT AERO   . fluticasone (FLONASE) 50 MCG/ACT nasal spray Place 2 sprays into both nostrils daily.  Marland Kitchen  hydrochlorothiazide (HYDRODIURIL) 25 MG tablet Take 1 tablet (25 mg total) by mouth daily.  Marland Kitchen levothyroxine (SYNTHROID, LEVOTHROID) 50 MCG tablet Take 1 tablet (50 mcg total) by mouth daily.  . metFORMIN (GLUCOPHAGE-XR) 750 MG 24 hr tablet Take 1 tablet (750 mg total) by mouth at bedtime. TAKE 1 TABLET BY MOUTH EVERY DAY WITH supper  . montelukast (SINGULAIR) 10 MG tablet Take 1 tablet (10 mg total) by mouth at bedtime.  . Multiple Vitamin (MULTIVITAMIN WITH MINERALS) TABS tablet Take 1 tablet by mouth daily.  Marland Kitchen omeprazole (PRILOSEC) 40 MG capsule Take 40 mg by mouth daily.  . Vitamin D, Cholecalciferol, 50 MCG (2000 UT) CAPS Take 1 capsule by mouth daily.  . [DISCONTINUED] albuterol (PROVENTIL HFA;VENTOLIN HFA) 108 (90 Base) MCG/ACT inhaler Inhale 2 puffs into the lungs every 6 (six) hours as needed for wheezing or shortness of breath.  Marland Kitchen amitriptyline (ELAVIL) 10 MG tablet Take 1 tablet by mouth at bedtime as needed. Dr Rodolph Bong   No facility-administered encounter medications on file as of 02/07/2019.     Activities of Daily Living In your present state of health, do you have any difficulty performing the following activities: 02/07/2019  Hearing? N  Comment declines hearing aids  Vision? N  Comment wears glasses  Difficulty concentrating or making decisions? N  Walking or climbing stairs? N  Dressing or bathing? N  Doing errands, shopping? N  Preparing Food and eating ? N  Using the  Toilet? N  In the past six months, have you accidently leaked urine? N  Do you have problems with loss of bowel control? N  Managing your Medications? N  Managing your Finances? N  Housekeeping or managing your Housekeeping? N  Some recent data might be hidden    Patient Care Team: Juline Patch, MD as PCP - General (Family Medicine) Ihor Dow, MD as Consulting Physician (Pulmonary Disease) Everlean Patterson, MD as Consulting Physician (Otolaryngology) Patrice Paradise Carlynn Spry, MD as Consulting Physician (Otolaryngology)    Assessment:   This is a routine wellness examination for [place].  Exercise Activities and Dietary recommendations Current Exercise Habits: Home exercise routine, Type of exercise: walking, Time (Minutes): 30, Frequency (Times/Week): 7, Weekly Exercise (Minutes/Week): 210, Intensity: Mild, Exercise limited by: respiratory conditions(s)  Goals    . DIET - INCREASE WATER INTAKE     Recommend to drink at least 6-8 8oz glasses of water per day.    . Patient Stated     Pt would like to maintain A1c with recent diagnosis of Type 2 DM       Fall Risk Fall Risk  02/07/2019 02/01/2019 01/25/2019 01/18/2019 12/18/2018  Falls in the past year? 0 (No Data) 0 (No Data) 0  Comment - no falls since previous visit - no falls since previous visit -  Number falls in past yr: 0 - - - -  Injury with Fall? 0 - - - -  Risk for fall due to : - - - - -  Risk for fall due to: Comment - - - - -  Follow up Falls prevention discussed - - - -    FALL RISK PREVENTION PERTAINING TO THE HOME:  Any stairs in or around the home? Yes  If so, do they handrails? Yes   Home free of loose throw rugs in walkways, pet beds, electrical cords, etc? Yes  Adequate lighting in your home to reduce risk of falls? Yes   ASSISTIVE DEVICES UTILIZED TO PREVENT FALLS:  Life alert? No  Use of a cane, walker or w/c? No  Grab bars in the bathroom? Yes  Shower chair or bench in shower? No   Elevated toilet seat or a handicapped toilet? No   DME ORDERS:  DME order needed?  No   TIMED UP AND GO:  Was the test performed? No .  Telephonic visit.  Education: Fall risk prevention has been discussed.  Intervention(s) required? No   Depression Screen PHQ 2/9 Scores 02/07/2019 12/18/2018 11/03/2018 07/20/2018  PHQ - 2 Score 0 0 0 0  PHQ- 9 Score - - 0 0     Cognitive Function     6CIT Screen 02/07/2019 02/06/2018  What Year? 0 points 0 points  What month? 0 points 0 points  What time? 0 points 0 points  Count back from 20 0 points 0 points  Months in reverse 0 points 0 points  Repeat phrase 0 points 0 points  Total Score 0 0    Immunization History  Administered Date(s) Administered  . Influenza, High Dose Seasonal PF 07/06/2017, 06/28/2018  . Influenza-Unspecified 05/21/2014, 06/24/2014, 06/03/2015, 04/20/2016, 05/25/2016, 07/06/2017  . Pneumococcal Conjugate-13 11/10/2016  . Pneumococcal Polysaccharide-23 01/16/2014  . Tdap 11/06/2015  . Zoster 03/09/2012    Qualifies for Shingles Vaccine? Yes  Zostavax completed 2013. Due for Shingrix. Education has been provided regarding the importance of this vaccine. Pt has been advised to call insurance company to determine out of pocket expense. Advised may also receive vaccine at local pharmacy or Health Dept. Verbalized acceptance and understanding.  Tdap: Up to date  Flu Vaccine: Up to date  Pneumococcal Vaccine: Up to date   Screening Tests Health Maintenance  Topic Date Due  . OPHTHALMOLOGY EXAM  01/08/1956  . INFLUENZA VACCINE  04/21/2019  . HEMOGLOBIN A1C  07/19/2019  . FOOT EXAM  01/17/2020  . URINE MICROALBUMIN  01/17/2020  . TETANUS/TDAP  11/05/2025  . COLONOSCOPY  04/22/2026  . DEXA SCAN  Completed  . Hepatitis C Screening  Completed  . PNA vac Low Risk Adult  Completed  . MAMMOGRAM  Discontinued    Cancer Screenings:  Colorectal Screening: Completed 04/22/16. Repeat every 10 years.  Mammogram:   No longer required, double mastectomy.  Bone Density: Completed 11/11/15. Results reflect NORMAL. Repeat every 2 years. Pt declined repeat screening at this time.   Lung Cancer Screening: (Low Dose CT Chest recommended if Age 75-80 years, 30 pack-year currently smoking OR have quit w/in 15years.) does not qualify.    Additional Screening:  Hepatitis C Screening: does qualify; Completed 10/17/17  Vision Screening: Recommended annual ophthalmology exams for early detection of glaucoma and other disorders of the eye. Is the patient up to date with their annual eye exam?  Yes  Who is the provider or what is the name of the office in which the pt attends annual eye exams? Dr. Glennon Mac at [place]: Recommended annual dental exams for proper oral hygiene  Community Resource Referral:  CRR required this visit?  No      Plan:      I have personally reviewed and addressed the Medicare Annual Wellness questionnaire and have noted the following in the patient's chart:  A. Medical and social history B. Use of alcohol, tobacco or illicit drugs  C. Current medications and supplements D. Functional ability and status E.  Nutritional status F.  Physical activity G. Advance directives H. List of other physicians I.  Hospitalizations, surgeries, and ER visits  in previous 12 months J.  [place] such as hearing and vision if needed, cognitive and depression L. Referrals and appointments   In addition, I have reviewed and discussed with patient certain preventive protocols, quality metrics, and best practice recommendations. A written personalized care plan for preventive services as well as general preventive health recommendations were provided to patient.   Signed,  Clemetine Marker, LPN Nurse Health Advisor   Nurse Notes: pt doing well and appreciative of telephonic visit today. She has recently completed diabetic education course and states she  thouroughly enjoyed it and feels more confident about managing her diabetes.

## 2019-02-07 NOTE — Patient Instructions (Signed)
Linda Vazquez , Thank you for taking time to come for your Medicare Wellness Visit. I appreciate your ongoing commitment to your health goals. Please review the following plan we discussed and let me know if I can assist you in the future.   Screening recommendations/referrals: Colonoscopy: completed 04/22/16. Bone Density: completed 11/11/15 Recommended yearly ophthalmology/optometry visit for glaucoma screening and checkup Recommended yearly dental visit for hygiene and checkup  Vaccinations: Influenza vaccine: done 07/07/18 Pneumococcal vaccine: done 11/10/16 Tdap vaccine: done 11/06/15 Shingles vaccine: Shingrix discussed. Please contact your pharmacy for coverage information.   Advanced directives: Please bring a copy of your health care power of attorney and living will to the office at your convenience.  Conditions/risks identified: Recommend maintaining healthy A1c with diet and exercise.   Next appointment: Please follow up in one year for your Medicare Annual Wellness visit.     Preventive Care 75 Years and Older, Female Preventive care refers to lifestyle choices and visits with your health care provider that can promote health and wellness. What does preventive care include?  A yearly physical exam. This is also called an annual well check.  Dental exams once or twice a year.  Routine eye exams. Ask your health care provider how often you should have your eyes checked.  Personal lifestyle choices, including:  Daily care of your teeth and gums.  Regular physical activity.  Eating a healthy diet.  Avoiding tobacco and drug use.  Limiting alcohol use.  Practicing safe sex.  Taking low-dose aspirin every day.  Taking vitamin and mineral supplements as recommended by your health care provider. What happens during an annual well check? The services and screenings done by your health care provider during your annual well check will depend on your age, overall health,  lifestyle risk factors, and family history of disease. Counseling  Your health care provider may ask you questions about your:  Alcohol use.  Tobacco use.  Drug use.  Emotional well-being.  Home and relationship well-being.  Sexual activity.  Eating habits.  History of falls.  Memory and ability to understand (cognition).  Work and work Statistician.  Reproductive health. Screening  You may have the following tests or measurements:  Height, weight, and BMI.  Blood pressure.  Lipid and cholesterol levels. These may be checked every 5 years, or more frequently if you are over 75 years old.  Skin check.  Lung cancer screening. You may have this screening every year starting at age 12 if you have a 30-pack-year history of smoking and currently smoke or have quit within the past 15 years.  Fecal occult blood test (FOBT) of the stool. You may have this test every year starting at age 48.  Flexible sigmoidoscopy or colonoscopy. You may have a sigmoidoscopy every 5 years or a colonoscopy every 10 years starting at age 65.  Hepatitis C blood test.  Hepatitis B blood test.  Sexually transmitted disease (STD) testing.  Diabetes screening. This is done by checking your blood sugar (glucose) after you have not eaten for a while (fasting). You may have this done every 1-3 years.  Bone density scan. This is done to screen for osteoporosis. You may have this done starting at age 68.  Mammogram. This may be done every 1-2 years. Talk to your health care provider about how often you should have regular mammograms. Talk with your health care provider about your test results, treatment options, and if necessary, the need for more tests. Vaccines  Your health care  provider may recommend certain vaccines, such as:  Influenza vaccine. This is recommended every year.  Tetanus, diphtheria, and acellular pertussis (Tdap, Td) vaccine. You may need a Td booster every 10 years.  Zoster  vaccine. You may need this after age 67.  Pneumococcal 13-valent conjugate (PCV13) vaccine. One dose is recommended after age 17.  Pneumococcal polysaccharide (PPSV23) vaccine. One dose is recommended after age 67. Talk to your health care provider about which screenings and vaccines you need and how often you need them. This information is not intended to replace advice given to you by your health care provider. Make sure you discuss any questions you have with your health care provider. Document Released: 10/03/2015 Document Revised: 05/26/2016 Document Reviewed: 07/08/2015 Elsevier Interactive Patient Education  2017 [place] Prevention in the Home Falls can cause injuries. They can happen to people of all ages. There are many things you can do to make your home safe and to help prevent falls. What can I do on the outside of my home?  Regularly fix the edges of walkways and driveways and fix any cracks.  Remove anything that might make you trip as you walk through a door, such as a raised step or threshold.  Trim any bushes or trees on the path to your home.  Use bright outdoor lighting.  Clear any walking paths of anything that might make someone trip, such as rocks or tools.  Regularly check to see if handrails are loose or broken. Make sure that both sides of any steps have handrails.  Any raised decks and porches should have guardrails on the edges.  Have any leaves, snow, or ice cleared regularly.  Use sand or salt on walking paths during winter.  Clean up any spills in your garage right away. This includes oil or grease spills. What can I do in the bathroom?  Use night lights.  Install grab bars by the toilet and in the tub and shower. Do not use towel bars as grab bars.  Use non-skid mats or decals in the tub or shower.  If you need to sit down in the shower, use a plastic, non-slip stool.  Keep the floor dry. Clean up any water that spills on the  floor as soon as it happens.  Remove soap buildup in the tub or shower regularly.  Attach bath mats securely with double-sided non-slip rug tape.  Do not have throw rugs and other things on the floor that can make you trip. What can I do in the bedroom?  Use night lights.  Make sure that you have a light by your bed that is easy to reach.  Do not use any sheets or blankets that are too big for your bed. They should not hang down onto the floor.  Have a firm chair that has side arms. You can use this for support while you get dressed.  Do not have throw rugs and other things on the floor that can make you trip. What can I do in the kitchen?  Clean up any spills right away.  Avoid walking on wet floors.  Keep items that you use a lot in easy-to-reach places.  If you need to reach something above you, use a strong step stool that has a grab bar.  Keep electrical cords out of the way.  Do not use floor polish or wax that makes floors slippery. If you must use wax, use non-skid floor wax.  Do not have throw  rugs and other things on the floor that can make you trip. What can I do with my stairs?  Do not leave any items on the stairs.  Make sure that there are handrails on both sides of the stairs and use them. Fix handrails that are broken or loose. Make sure that handrails are as long as the stairways.  Check any carpeting to make sure that it is firmly attached to the stairs. Fix any carpet that is loose or worn.  Avoid having throw rugs at the top or bottom of the stairs. If you do have throw rugs, attach them to the floor with carpet tape.  Make sure that you have a light switch at the top of the stairs and the bottom of the stairs. If you do not have them, ask someone to add them for you. What else can I do to help prevent falls?  Wear shoes that:  Do not have high heels.  Have rubber bottoms.  Are comfortable and fit you well.  Are closed at the toe. Do not wear  sandals.  If you use a stepladder:  Make sure that it is fully opened. Do not climb a closed stepladder.  Make sure that both sides of the stepladder are locked into place.  Ask someone to hold it for you, if possible.  Clearly mark and make sure that you can see:  Any grab bars or handrails.  First and last steps.  Where the edge of each step is.  Use tools that help you move around (mobility aids) if they are needed. These include:  Canes.  Walkers.  Scooters.  Crutches.  Turn on the lights when you go into a dark area. Replace any light bulbs as soon as they burn out.  Set up your furniture so you have a clear path. Avoid moving your furniture around.  If any of your floors are uneven, fix them.  If there are any pets around you, be aware of where they are.  Review your medicines with your doctor. Some medicines can make you feel dizzy. This can increase your chance of falling. Ask your doctor what other things that you can do to help prevent falls. This information is not intended to replace advice given to you by your health care provider. Make sure you discuss any questions you have with your health care provider. Document Released: 07/03/2009 Document Revised: 02/12/2016 Document Reviewed: 10/11/2014 Elsevier Interactive Patient Education  2017 [employer].

## 2019-02-28 ENCOUNTER — Other Ambulatory Visit: Payer: Self-pay | Admitting: Family Medicine

## 2019-03-07 ENCOUNTER — Encounter: Payer: Self-pay | Admitting: Family Medicine

## 2019-03-07 ENCOUNTER — Other Ambulatory Visit: Payer: Self-pay

## 2019-03-07 ENCOUNTER — Ambulatory Visit: Payer: Medicare Other | Admitting: Family Medicine

## 2019-03-07 VITALS — BP 112/62 | HR 76 | Ht 62.0 in | Wt 192.0 lb

## 2019-03-07 DIAGNOSIS — I872 Venous insufficiency (chronic) (peripheral): Secondary | ICD-10-CM

## 2019-03-07 DIAGNOSIS — E119 Type 2 diabetes mellitus without complications: Secondary | ICD-10-CM | POA: Diagnosis not present

## 2019-03-07 DIAGNOSIS — E669 Obesity, unspecified: Secondary | ICD-10-CM

## 2019-03-07 DIAGNOSIS — Z6833 Body mass index (BMI) 33.0-33.9, adult: Secondary | ICD-10-CM | POA: Insufficient documentation

## 2019-03-07 MED ORDER — VICTOZA 18 MG/3ML ~~LOC~~ SOPN: 0.6000 mg | PEN_INJECTOR | Freq: Every day | SUBCUTANEOUS | 2 refills | Status: DC

## 2019-03-07 NOTE — Progress Notes (Signed)
Date:  03/07/2019   Name:  Linda Vazquez   DOB:  10-25-1945   MRN:  [id number]   Chief Complaint: Cellulitis (L) leg- showing an area of concern for "breaking down")  Rash This is a new problem. The current episode started more than 1 year ago (exacerbation last 2 months). The problem has been gradually worsening since onset. The affected locations include the left lower leg. The rash is characterized by redness and scaling. She was exposed to nothing. Pertinent negatives include no congestion, cough, diarrhea, eye pain, fatigue, fever, joint pain, rhinorrhea, shortness of breath, sore throat or vomiting. Treatments tried: lotion. The treatment provided mild relief.  Diabetes She presents for her follow-up diabetic visit. She has type 2 diabetes mellitus. Her disease course has been stable. There are no hypoglycemic associated symptoms. Pertinent negatives for hypoglycemia include no dizziness, headaches or nervousness/anxiousness. Pertinent negatives for diabetes include no blurred vision, no chest pain, no fatigue, no foot paresthesias, no foot ulcerations, no polydipsia, no polyphagia, no polyuria, no visual change, no weakness and no weight loss. (Stasis dermatitis) There are no hypoglycemic complications. Symptoms are stable (but elevated). There are no diabetic complications. There are no known risk factors for coronary artery disease. She is compliant with treatment all of the time.    Review of Systems  Constitutional: Negative.  Negative for chills, fatigue, fever, unexpected weight change and weight loss.  HENT: Negative for congestion, ear discharge, ear pain, rhinorrhea, sinus pressure, sneezing and sore throat.   Eyes: Negative for blurred vision, photophobia, pain, discharge, redness and itching.  Respiratory: Negative for cough, shortness of breath, wheezing and stridor.   Cardiovascular: Negative for chest pain.  Gastrointestinal: Negative for abdominal pain, blood in stool,  constipation, diarrhea, nausea and vomiting.  Endocrine: Negative for cold intolerance, heat intolerance, polydipsia, polyphagia and polyuria.  Genitourinary: Negative for dysuria, flank pain, frequency, hematuria, menstrual problem, pelvic pain, urgency, vaginal bleeding and vaginal discharge.  Musculoskeletal: Negative for arthralgias, back pain, joint pain and myalgias.  Skin: Positive for rash.  Allergic/Immunologic: Negative for environmental allergies and food allergies.  Neurological: Negative for dizziness, weakness, light-headedness, numbness and headaches.  Hematological: Negative for adenopathy. Does not bruise/bleed easily.  Psychiatric/Behavioral: Negative for dysphoric mood. The patient is not nervous/anxious.     Patient Active Problem List   Diagnosis Date Noted  . New onset type 2 diabetes mellitus ([place]) 01/17/2019  . Nontraumatic complete tear of right rotator cuff 12/13/2018  . Rotator cuff tendinitis, right 12/13/2018  . Asthma, cough variant 05/29/2015  . 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 Chronic cough 10/31/2014  . Esophageal dysfunction 09/23/2014  . 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 BP (high blood pressure) 06/04/2013  . Dysphonia 03/01/2013  . Adult hypothyroidism 03/01/2013  . Malignant neoplasm of breast ([place]) 02/09/2013  . Acquired lymphedema 02/09/2013  . 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 Ace Inhibitors Cough    Per pt bp med caused cough possible ace inhibitor  . Sulfa Antibiotics Rash    Other Reaction: Not Assessed    Past Surgical History:  Procedure Laterality Date  . BIOPSY THYROID     benign  . BREAST LUMPECTOMY Right   . COLONOSCOPY  2006   repeat in 10 yrs- [facility] Dr  . KNEE ARTHROSCOPY WITH MENISCAL REPAIR Bilateral   . MASTECTOMY Left   . MASTECTOMY Right    2 yrs later  . MEDIASTINAL MASS EXCISION    .  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 TONSILLECTOMY    .  tubal cauterization      Social History   Tobacco Use  . Smoking status: Never Smoker  . Smokeless tobacco: Never Used  . Tobacco comment: Smoking cessation materials not required  Substance Use Topics  . Alcohol use: Yes    Alcohol/week: 0.0 standard drinks    Comment: socially 1-2 x month  . Drug use: No     Medication list has been reviewed and updated.  Current Meds  Medication Sig  . amitriptyline (ELAVIL) 10 MG tablet Take 1 tablet by mouth at bedtime as needed. Dr Rodolph Bong  . aspirin EC 81 MG tablet Take 1 tablet by mouth daily.  Marland Kitchen azelastine (ASTELIN) 0.1 % nasal spray Place 1 spray into the nose 2 (two) times daily. [facility]  . benzonatate (TESSALON) 100 MG capsule TAKE 1 CAPSULE (100 MG TOTAL) BY MOUTH 2 (TWO) TIMES DAILY AS NEEDED- [facility]  . carbamide peroxide (DEBROX) 6.5 % OTIC solution Place 5 drops into both ears 2 (two) times daily. (Patient taking differently: Place 5 drops into both ears 2 (two) times daily as needed. )  . cetirizine-pseudoephedrine (ZYRTEC-D) 5-120 MG tablet Take 1 tablet by mouth daily. OTC  . clonazePAM (KLONOPIN) 0.5 MG tablet Take 0.5 mg by mouth daily. Otolaryn. DR Patrice Paradise  . DULERA 100-5 MCG/ACT AERO   . fluticasone (FLONASE) 50 MCG/ACT nasal spray Place 2 sprays into both nostrils daily.  . hydrochlorothiazide (HYDRODIURIL) 25 MG tablet Take 1 tablet (25 mg total) by mouth daily.  Marland Kitchen levothyroxine (SYNTHROID, LEVOTHROID) 50 MCG tablet Take 1 tablet (50 mcg total) by mouth daily.  . metFORMIN (GLUCOPHAGE-XR) 750 MG 24 hr tablet Take 1 tablet (750 mg total) by mouth at bedtime. TAKE 1 TABLET BY MOUTH EVERY DAY WITH supper  . montelukast (SINGULAIR) 10 MG tablet Take 1 tablet (10 mg total) by mouth at bedtime.  . Multiple Vitamin (MULTIVITAMIN WITH MINERALS) TABS tablet Take 1 tablet by mouth daily.  Marland Kitchen omeprazole (PRILOSEC) 40 MG capsule Take 40 mg by mouth daily.  Glory Rosebush Delica Lancets 03J MISC USE AS DIRECTED DAILY  . ONETOUCH ULTRA  test strip USE AS DIRECTED EVERY DAY  . Vitamin D, Cholecalciferol, 50 MCG (2000 UT) CAPS Take 1 capsule by mouth daily.    PHQ 2/9 Scores 03/07/2019 02/07/2019 12/18/2018 11/03/2018  PHQ - 2 Score 0 0 0 0  PHQ- 9 Score 0 - - 0    BP Readings from Last 3 Encounters:  03/07/19 112/62  02/07/19 134/82  02/01/19 126/80    Physical Exam Vitals signs and nursing note reviewed.  Constitutional:      General: She is not in acute distress.    Appearance: She is obese. She is not diaphoretic.  HENT:     Head: Normocephalic and atraumatic.     Right Ear: External ear normal.     Left Ear: External ear normal.     Nose: Nose normal.  Eyes:     General:        Right eye: No discharge.        Left eye: No discharge.     Conjunctiva/sclera: Conjunctivae normal.     Pupils: Pupils are equal, round, and reactive to light.  Neck:     Musculoskeletal: Normal range of motion and neck supple.     Thyroid: No thyromegaly.     Vascular: No  JVD.  Cardiovascular:     Rate and Rhythm: Normal rate and regular rhythm.     Heart sounds: Normal heart sounds. No murmur. No friction rub. No gallop.   Pulmonary:     Effort: Pulmonary effort is normal.     Breath sounds: Normal breath sounds.  Abdominal:     General: Bowel sounds are normal.     Palpations: Abdomen is soft. There is no mass.     Tenderness: There is no abdominal tenderness. There is no guarding.  Musculoskeletal: Normal range of motion.  Lymphadenopathy:     Cervical: No cervical adenopathy.  Skin:    General: Skin is warm and dry.     Findings: Erythema present.  Neurological:     Mental Status: She is alert.     Deep Tendon Reflexes: Reflexes are normal and symmetric.     Wt Readings from Last 3 Encounters:  03/07/19 192 lb (87.1 kg)  02/07/19 193 lb (87.5 kg)  02/01/19 197 lb 9.6 oz (89.6 kg)    BP 112/62   Pulse 76   Ht 5\' 2"  (1.575 m)   Wt 192 lb (87.1 kg)   BMI 35.12 kg/m    Assessment and Plan: 1. Venous  stasis dermatitis of left lower extremity Patient has had intermittent swelling of her lower extremities with some skin changes consistent with stasis dermatitis.  Patient was not concerned of cellulitis but it does not appear to be cellulitis will refer to dermatology for evaluation in the meantime discussed use of a emollients - Ambulatory referral to Dermatology  2. Type 2 diabetes mellitus without complication, without long-term current use of insulin (HCC) Patient's last A1c was noted to be 10.3 down from 8.4 this is still not within control level which is to be under 7.  Discussed with patient necessity of going beyond the metformin.  Would like to initiate Victoza because patient needs to lose weight and I think this would be a better control mechanism.  Patient will be started on Victoza 0.6 mg injection and follow-up in 4 weeks. - liraglutide (VICTOZA) 18 MG/3ML SOPN; Inject 0.1 mLs (0.6 mg total) into the skin daily.  Dispense: 1 pen; Refill: 2  3. Venous insufficiency of both lower extremities And has intermittent swelling of her lower extremities.  Patient is unable to get her husband stockings on because they are too small and therefore needs to be measured properly for compression stockings.  Patient was provided a prescription for that and we will recheck in 4 weeks. - Compression stockings  4. Obesity (BMI 35.0-39.9 without comorbidity) Weight loss was discussed.Health risks of being over weight were discussed and patient was counseled on weight loss options and exercise.  Weight loss diet with reduced number of calories was provided.

## 2019-03-07 NOTE — Patient Instructions (Addendum)
Stasis Dermatitis Stasis dermatitis is a long-term (chronic) skin condition that happens when veins can no longer pump blood back to the heart (poor circulation). This condition causes a red or brown scaly rash or sores (ulcers) from the pooling of blood (stasis). This condition usually affects the lower legs. It may affect one leg or both legs. Without treatment, severe stasis dermatitis can lead to other skin conditions and infections. What are the causes? This condition is caused by poor circulation. What increases the risk? You are more likely to develop this condition if:  You are not very active.  You stand for long periods of time.  You have veins that have become enlarged and twisted (varicose veins).  You have leg veins that are not strong enough to send blood back to the heart (venous insufficiency).  You have had a blood clot.  You have been pregnant many times.  You have had vein surgery.  You are obese.  You have heart or kidney failure.  You are 75 years of age or older.  You have had injuries to your legs in the past. What are the signs or symptoms? Common early symptoms of this condition include:  Itchiness in one or both of your legs.  Swelling in your ankle or leg. This might get better overnight but be worse again during the day.  Skin that looks thin on your ankle and leg.  Red or brown marks that develop slowly.  Skin that is dry, cracked, or easily irritated.  Red, swollen skin that is sore or has a burning feeling.  An achy or heavy feeling after you walk or stand for long periods of time.  Pain. Later and more severe symptoms of this condition include:  Skin that looks shiny.  Small, open sores (ulcers). These are often red or purple and leak fluid.  Skin that feels hard.  Severe itching.  A change in the shape or color of your lower legs.  Severe pain.  Difficulty walking. How is this diagnosed? This condition may be diagnosed  based on:  Your symptoms and medical history.  A physical exam. You may also have tests, including:  Blood tests.  Imaging tests to check blood flow (Doppler ultrasound).  Allergy tests. You may need to see a health care provider who specializes in skin diseases (dermatologist). How is this treated? This condition may be treated with:  Compression stockings or an elastic wrap to improve circulation.  Medicines, such as: ? Corticosteroid creams and ointments. ? Non-corticosteroid medicines applied to the skin (topical). ? Medicine to reduce swelling in the legs (diuretics). ? Antibiotics. ? Medicine to relieve itching (antihistamines).  A bandage (dressing).  A wrap that contains zinc and gelatin (Unna boot). Follow these instructions at home: Skin care  Moisturize your skin as told by your health care provider. Do not use moisturizers with fragrance. This can irritate your skin.  Apply a cool, wet cloth (cool compress) to the affected areas.  Do not scratch your skin.  Do not rub your skin dry after a bath or shower. Gently pat your skin dry.  Do not use scented soaps, detergents, or perfumes. Medicines  Take or use over-the-counter and prescription medicines only as told by your health care provider.  If you were prescribed an antibiotic medicine, take or use it as told by your health care provider. Do not stop taking or using the antibiotic even if your condition improves. Activity  Walk as told by your health care  provider. Walking increases blood flow.  Do calf and ankle exercises throughout the day as told by your health care provider. This will help increase blood flow.  Raise (elevate) your legs above the level of your heart when you are sitting or lying down. Lifestyle  Work with your health care provider to lose weight, if needed.  Do not cross your legs when you sit.  Do not stand or sit in one position for long periods of time.  Wear comfortable,  loose-fitting clothing. Circulation in your legs will be worse if you wear tight pants, belts, and waistbands.  Do not use any products that contain nicotine or tobacco, such as cigarettes, e-cigarettes, and chewing tobacco. If you need help quitting, ask your health care provider. General instructions  If you were asked to use one of the following to help with your condition, follow instructions from your health care provider on how to: ? Remove and change any dressing. ? Wear compression stockings. These stockings help to prevent blood clots and reduce swelling in your legs. ? Wear the [employer].  Keep all follow-up visits as told by your health care provider. This is important. Contact a health care provider if:  Your condition does not improve with treatment.  Your condition gets worse.  You have signs of infection in the affected area. Watch for: ? Swelling. ? Tenderness. ? Redness. ? Soreness. ? Warmth.  You have a fever. Get help right away if:  You notice red streaks coming from the affected area.  Your bone or joint underneath the affected area becomes painful after the skin has healed.  The affected area turns darker.  You feel a deep pain in your leg or groin.  You are short of breath. Summary  Stasis dermatitis is a long-term (chronic) skin condition that happens when veins can no longer pump blood back to the heart (poor circulation).  Wear compression stockings as told by your health care provider. These stockings help to prevent blood clots and reduce swelling in your legs.  Follow instructions from your health care provider about activity, medicines, and lifestyle.  Contact a health care provider if you have a fever or have signs of infection in the affected area.  Keep all follow-up visits as told by your health care provider. This is important. This information is not intended to replace advice given to you by your health care provider. Make sure you  discuss any questions you have with your health care provider. Document Released: 12/16/2005 Document Revised: 02/06/2018 Document Reviewed: 02/06/2018 Elsevier Interactive Patient Education  2019 [employer].  Calorie Counting for [employer] Calories are units of energy. Your body needs a certain amount of calories from food to keep you going throughout the day. When you eat more calories than your body needs, your body stores the extra calories as fat. When you eat fewer calories than your body needs, your body burns fat to get the energy it needs. Calorie counting means keeping track of how many calories you eat and drink each day. Calorie counting can be helpful if you need to lose weight. If you make sure to eat fewer calories than your body needs, you should lose weight. Ask your health care provider what a healthy weight is for you. For calorie counting to work, you will need to eat the right number of calories in a day in order to lose a healthy amount of weight per week. A dietitian can help you determine how many  calories you need in a day and will give you suggestions on how to reach your calorie goal.  A healthy amount of weight to lose per week is usually 1-2 lb (0.5-0.9 kg). This usually means that your daily calorie intake should be reduced by 500-750 calories.  Eating 1,200 - 1,500 calories per day can help most women lose weight.  Eating 1,500 - 1,800 calories per day can help most men lose weight. What is my plan? My goal is to have __________ calories per day. If I have this many calories per day, I should lose around __________ pounds per week. What do I need to know about calorie counting? In order to meet your daily calorie goal, you will need to:  Find out how many calories are in each food you would like to eat. Try to do this before you eat.  Decide how much of the food you plan to eat.  Write down what you ate and how many calories it had. Doing this is called  keeping a food log. To successfully lose weight, it is important to balance calorie counting with a healthy lifestyle that includes regular activity. Aim for 150 minutes of moderate exercise (such as walking) or 75 minutes of vigorous exercise (such as running) each week. Where do I find calorie information?  The number of calories in a food can be found on a Nutrition Facts label. If a food does not have a Nutrition Facts label, try to look up the calories online or ask your dietitian for help. Remember that calories are listed per serving. If you choose to have more than one serving of a food, you will have to multiply the calories per serving by the amount of servings you plan to eat. For example, the label on a package of bread might say that a serving size is 1 slice and that there are 90 calories in a serving. If you eat 1 slice, you will have eaten 90 calories. If you eat 2 slices, you will have eaten 180 calories. How do I keep a food log? Immediately after each meal, record the following information in your food log:  What you ate. Don't forget to include toppings, sauces, and other extras on the food.  How much you ate. This can be measured in cups, ounces, or number of items.  How many calories each food and drink had.  The total number of calories in the meal. Keep your food log near you, such as in a small notebook in your pocket, or use a mobile app or website. Some programs will calculate calories for you and show you how many calories you have left for the day to meet your goal. What are some calorie counting tips?   Use your calories on foods and drinks that will fill you up and not leave you hungry: ? Some examples of foods that fill you up are nuts and nut butters, vegetables, lean proteins, and high-fiber foods like whole grains. High-fiber foods are foods with more than 5 g fiber per serving. ? Drinks such as sodas, specialty coffee drinks, alcohol, and juices have a lot of  calories, yet do not fill you up.  Eat nutritious foods and avoid empty calories. Empty calories are calories you get from foods or beverages that do not have many vitamins or protein, such as candy, sweets, and soda. It is better to have a nutritious high-calorie food (such as an avocado) than a food with few nutrients (such  as a bag of chips).  Know how many calories are in the foods you eat most often. This will help you calculate calorie counts faster.  Pay attention to calories in drinks. Low-calorie drinks include water and unsweetened drinks.  Pay attention to nutrition labels for "low fat" or "fat free" foods. These foods sometimes have the same amount of calories or more calories than the full fat versions. They also often have added sugar, starch, or salt, to make up for flavor that was removed with the fat.  Find a way of tracking calories that works for you. Get creative. Try different apps or programs if writing down calories does not work for you. What are some portion control tips?  Know how many calories are in a serving. This will help you know how many servings of a certain food you can have.  Use a measuring cup to measure serving sizes. You could also try weighing out portions on a kitchen scale. With time, you will be able to estimate serving sizes for some foods.  Take some time to put servings of different foods on your favorite plates, bowls, and cups so you know what a serving looks like.  Try not to eat straight from a bag or box. Doing this can lead to overeating. Put the amount you would like to eat in a cup or on a plate to make sure you are eating the right portion.  Use smaller plates, glasses, and bowls to prevent overeating.  Try not to multitask (for example, watch TV or use your computer) while eating. If it is time to eat, sit down at a table and enjoy your food. This will help you to know when you are full. It will also help you to be aware of what you are  eating and how much you are eating. What are tips for following this plan? Reading food labels  Check the calorie count compared to the serving size. The serving size may be smaller than what you are used to eating.  Check the source of the calories. Make sure the food you are eating is high in vitamins and protein and low in saturated and trans fats. Shopping  Read nutrition labels while you shop. This will help you make healthy decisions before you decide to purchase your food.  Make a grocery list and stick to it. Cooking  Try to cook your favorite foods in a healthier way. For example, try baking instead of frying.  Use low-fat dairy products. Meal planning  Use more fruits and vegetables. Half of your plate should be fruits and vegetables.  Include lean proteins like poultry and fish. How do I count calories when eating out?  Ask for smaller portion sizes.  Consider sharing an entree and sides instead of getting your own entree.  If you get your own entree, eat only half. Ask for a box at the beginning of your meal and put the rest of your entree in it so you are not tempted to eat it.  If calories are listed on the menu, choose the lower calorie options.  Choose dishes that include vegetables, fruits, whole grains, low-fat dairy products, and lean protein.  Choose items that are boiled, broiled, grilled, or steamed. Stay away from items that are buttered, battered, fried, or served with cream sauce. Items labeled "crispy" are usually fried, unless stated otherwise.  Choose water, low-fat milk, unsweetened iced tea, or other drinks without added sugar. If you want an alcoholic  beverage, choose a lower calorie option such as a glass of wine or light beer.  Ask for dressings, sauces, and syrups on the side. These are usually high in calories, so you should limit the amount you eat.  If you want a salad, choose a garden salad and ask for grilled meats. Avoid extra toppings  like bacon, cheese, or fried items. Ask for the dressing on the side, or ask for olive oil and vinegar or lemon to use as dressing.  Estimate how many servings of a food you are given. For example, a serving of cooked rice is  cup or about the size of half a baseball. Knowing serving sizes will help you be aware of how much food you are eating at restaurants. The list below tells you how big or small some common portion sizes are based on everyday objects: ? 1 oz--4 stacked dice. ? 3 oz--1 deck of cards. ? 1 tsp--1 die. ? 1 Tbsp-- a ping-pong ball. ? 2 Tbsp--1 ping-pong ball. ?  cup-- baseball. ? 1 cup--1 baseball. Summary  Calorie counting means keeping track of how many calories you eat and drink each day. If you eat fewer calories than your body needs, you should lose weight.  A healthy amount of weight to lose per week is usually 1-2 lb (0.5-0.9 kg). This usually means reducing your daily calorie intake by 500-750 calories.  The number of calories in a food can be found on a Nutrition Facts label. If a food does not have a Nutrition Facts label, try to look up the calories online or ask your dietitian for help.  Use your calories on foods and drinks that will fill you up, and not on foods and drinks that will leave you hungry.  Use smaller plates, glasses, and bowls to prevent overeating. This information is not intended to replace advice given to you by your health care provider. Make sure you discuss any questions you have with your health care provider. Document Released: 09/06/2005 Document Revised: 05/26/2018 Document Reviewed: 08/06/2016 Elsevier Interactive Patient Education  2019 [employer].

## 2019-03-09 ENCOUNTER — Other Ambulatory Visit: Payer: Self-pay

## 2019-03-09 DIAGNOSIS — E119 Type 2 diabetes mellitus without complications: Secondary | ICD-10-CM

## 2019-03-09 MED ORDER — PEN NEEDLES 32G X 4 MM MISC: 1.0000 | Freq: Every day | 0 refills | Status: DC

## 2019-04-05 ENCOUNTER — Ambulatory Visit: Payer: Medicare Other | Admitting: Family Medicine

## 2019-04-12 ENCOUNTER — Encounter: Payer: Self-pay | Admitting: Family Medicine

## 2019-04-12 ENCOUNTER — Other Ambulatory Visit: Payer: Self-pay

## 2019-04-12 ENCOUNTER — Ambulatory Visit: Payer: Medicare Other | Admitting: Family Medicine

## 2019-04-12 VITALS — BP 120/62 | HR 64 | Ht 62.0 in | Wt 189.0 lb

## 2019-04-12 DIAGNOSIS — E119 Type 2 diabetes mellitus without complications: Secondary | ICD-10-CM | POA: Diagnosis not present

## 2019-04-12 DIAGNOSIS — K219 Gastro-esophageal reflux disease without esophagitis: Secondary | ICD-10-CM

## 2019-04-12 DIAGNOSIS — E039 Hypothyroidism, unspecified: Secondary | ICD-10-CM

## 2019-04-12 MED ORDER — METFORMIN HCL ER 750 MG PO TB24: 750.0000 mg | ORAL_TABLET | Freq: Every day | ORAL | 1 refills | Status: DC

## 2019-04-12 MED ORDER — OMEPRAZOLE 40 MG PO CPDR: 40.0000 mg | DELAYED_RELEASE_CAPSULE | Freq: Every day | ORAL | 1 refills | Status: DC

## 2019-04-12 MED ORDER — LEVOTHYROXINE SODIUM 50 MCG PO TABS: 50.0000 ug | ORAL_TABLET | Freq: Every day | ORAL | 1 refills | Status: DC

## 2019-04-12 MED ORDER — VICTOZA 18 MG/3ML ~~LOC~~ SOPN: 0.6000 mg | PEN_INJECTOR | Freq: Every day | SUBCUTANEOUS | 1 refills | Status: DC

## 2019-04-12 NOTE — Progress Notes (Signed)
Date:  04/12/2019   Name:  Linda Vazquez   DOB:  31-Jan-1946   MRN:  [id number]   Chief Complaint: Diabetes (follow up with starting victoza) and Venous Insufficiency (follow up stockings- saw Derm Dr Nehemiah Massed and was dx with Varix)  Diabetes She presents for her follow-up diabetic visit. She has type 2 diabetes mellitus. Her disease course has been stable. There are no hypoglycemic associated symptoms. Pertinent negatives for hypoglycemia include no dizziness, headaches or nervousness/anxiousness. Associated symptoms include weight loss. Pertinent negatives for diabetes include no chest pain, no fatigue, no polydipsia, no polyphagia, no polyuria and no weakness. There are no hypoglycemic complications. Symptoms are stable. There are no diabetic complications. Risk factors for coronary artery disease include diabetes mellitus and dyslipidemia. Current diabetic treatment includes diet and oral agent (monotherapy) (and victoza). She is compliant with treatment all of the time. Her weight is stable. She is following a generally healthy diet. Meal planning includes carbohydrate counting, calorie counting and avoidance of concentrated sweets. She participates in exercise daily. Her home blood glucose trend is decreasing steadily. Her breakfast blood glucose is taken between 8-9 am. Her breakfast blood glucose range is generally 130-140 mg/dl. An ACE inhibitor/angiotensin II receptor blocker is not being taken. She does not see a podiatrist.Eye exam is current.  Thyroid Problem Presents for follow-up visit. Symptoms include weight loss. Patient reports no anxiety, cold intolerance, constipation, diaphoresis, diarrhea, dry skin, fatigue, heat intolerance, hoarse voice, leg swelling, menstrual problem, palpitations or weight gain. The symptoms have been stable.  Gastroesophageal Reflux She reports no abdominal pain, no belching, no chest pain, no choking, no coughing, no dysphagia, no early satiety, no  globus sensation, no heartburn, no hoarse voice, no nausea, no sore throat, no stridor, no tooth decay, no water brash or no wheezing. This is a chronic problem. The current episode started more than 1 year ago. The problem has been gradually improving. Associated symptoms include weight loss. Pertinent negatives include no fatigue. She has tried a PPI for the symptoms.    Review of Systems  Constitutional: Positive for weight loss. Negative for chills, diaphoresis, fatigue, fever, unexpected weight change and weight gain.  HENT: Negative for congestion, ear discharge, ear pain, hoarse voice, rhinorrhea, sinus pressure, sneezing and sore throat.   Eyes: Negative for photophobia, pain, discharge, redness and itching.  Respiratory: Negative for cough, choking, shortness of breath, wheezing and stridor.   Cardiovascular: Negative for chest pain and palpitations.  Gastrointestinal: Negative for abdominal pain, blood in stool, constipation, diarrhea, dysphagia, heartburn, nausea and vomiting.  Endocrine: Negative for cold intolerance, heat intolerance, polydipsia, polyphagia and polyuria.  Genitourinary: Negative for dysuria, flank pain, frequency, hematuria, menstrual problem, pelvic pain, urgency, vaginal bleeding and vaginal discharge.  Musculoskeletal: Negative for arthralgias, back pain and myalgias.  Skin: Negative for rash.  Allergic/Immunologic: Negative for environmental allergies and food allergies.  Neurological: Negative for dizziness, weakness, light-headedness, numbness and headaches.  Hematological: Negative for adenopathy. Does not bruise/bleed easily.  Psychiatric/Behavioral: Negative for dysphoric mood. The patient is not nervous/anxious.     Patient Active Problem List   Diagnosis Date Noted  . Venous stasis dermatitis of left lower extremity 03/07/2019  . Venous insufficiency of both lower extremities 03/07/2019  . Obesity (BMI 35.0-39.9 without comorbidity) 03/07/2019  . Type  2 diabetes mellitus without complication, without long-term current use of insulin ([place]) 01/17/2019  . Nontraumatic complete tear of right rotator cuff 12/13/2018  . Rotator cuff tendinitis, right 12/13/2018  .  Asthma, cough variant 05/29/2015  . 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 Chronic cough 10/31/2014  . Esophageal dysfunction 09/23/2014  . 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 BP (high blood pressure) 06/04/2013  . Dysphonia 03/01/2013  . Adult hypothyroidism 03/01/2013  . Malignant neoplasm of breast ([place]) 02/09/2013  . Acquired lymphedema 02/09/2013  . 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 Ace Inhibitors Cough    Per pt bp med caused cough possible ace inhibitor  . Sulfa Antibiotics Rash    Other Reaction: Not Assessed    Past Surgical History:  Procedure Laterality Date  . BIOPSY THYROID     benign  . BREAST LUMPECTOMY Right   . COLONOSCOPY  2006   repeat in 10 yrs- [facility] Dr  . KNEE ARTHROSCOPY WITH MENISCAL REPAIR Bilateral   . MASTECTOMY Left   . MASTECTOMY Right    2 yrs later  . MEDIASTINAL MASS EXCISION    . 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 TONSILLECTOMY    . tubal cauterization      Social History   Tobacco Use  . Smoking status: Never Smoker  . Smokeless tobacco: Never Used  . Tobacco comment: Smoking cessation materials not required  Substance Use Topics  . Alcohol use: Yes    Alcohol/week: 0.0 standard drinks    Comment: socially 1-2 x month  . Drug use: No     Medication list has been reviewed and updated.  Current Meds  Medication Sig  . amitriptyline (ELAVIL) 10 MG tablet Take 1 tablet by mouth at bedtime as needed. Dr Rodolph Bong  . ammonium lactate (LAC-HYDRIN) 12 % lotion Apply 1 application topically 2 (two) times daily. Dr Nehemiah Massed  . aspirin EC 81 MG tablet Take 1 tablet by mouth daily.  Marland Kitchen azelastine (ASTELIN) 0.1 % nasal spray Place 1 spray into the nose 2 (two) times  daily. [facility]  . benzonatate (TESSALON) 100 MG capsule TAKE 1 CAPSULE (100 MG TOTAL) BY MOUTH 2 (TWO) TIMES DAILY AS NEEDED- [facility]  . carbamide peroxide (DEBROX) 6.5 % OTIC solution Place 5 drops into both ears 2 (two) times daily. (Patient taking differently: Place 5 drops into both ears 2 (two) times daily as needed. )  . cetirizine-pseudoephedrine (ZYRTEC-D) 5-120 MG tablet Take 1 tablet by mouth daily. OTC  . clonazePAM (KLONOPIN) 0.5 MG tablet Take 0.5 mg by mouth daily. Otolaryn. DR Patrice Paradise  . DULERA 100-5 MCG/ACT AERO   . fluticasone (FLONASE) 50 MCG/ACT nasal spray Place 2 sprays into both nostrils daily.  . hydrochlorothiazide (HYDRODIURIL) 25 MG tablet Take 1 tablet (25 mg total) by mouth daily.  . Insulin Pen Needle (PEN NEEDLES) 32G X 4 MM MISC 1 each by Does not apply route daily.  Marland Kitchen levothyroxine (SYNTHROID, LEVOTHROID) 50 MCG tablet Take 1 tablet (50 mcg total) by mouth daily.  Marland Kitchen liraglutide (VICTOZA) 18 MG/3ML SOPN Inject 0.1 mLs (0.6 mg total) into the skin daily.  . metFORMIN (GLUCOPHAGE-XR) 750 MG 24 hr tablet Take 1 tablet (750 mg total) by mouth at bedtime. TAKE 1 TABLET BY MOUTH EVERY DAY WITH supper  . montelukast (SINGULAIR) 10 MG tablet Take 1 tablet (10 mg total) by mouth at bedtime.  . Multiple Vitamin (MULTIVITAMIN WITH MINERALS) TABS tablet Take 1 tablet by mouth daily.  Marland Kitchen omeprazole (PRILOSEC) 40 MG capsule Take 40 mg by mouth daily.  Glory Rosebush Delica Lancets 09N MISC USE AS  DIRECTED DAILY  . ONETOUCH ULTRA test strip USE AS DIRECTED EVERY DAY  . Vitamin D, Cholecalciferol, 50 MCG (2000 UT) CAPS Take 1 capsule by mouth daily.    PHQ 2/9 Scores 04/12/2019 03/07/2019 02/07/2019 12/18/2018  PHQ - 2 Score 0 0 0 0  PHQ- 9 Score 0 0 - -    BP Readings from Last 3 Encounters:  04/12/19 120/62  03/07/19 112/62  02/07/19 134/82    Physical Exam Vitals signs and nursing note reviewed.  Constitutional:      General: She is not in acute distress.    Appearance:  She is not diaphoretic.  HENT:     Head: Normocephalic and atraumatic.     Right Ear: Tympanic membrane, ear canal and external ear normal.     Left Ear: Tympanic membrane, ear canal and external ear normal.     Nose: Nose normal.     Mouth/Throat:     Mouth: Mucous membranes are moist.     Pharynx: No oropharyngeal exudate or posterior oropharyngeal erythema.  Eyes:     General:        Right eye: No discharge.        Left eye: No discharge.     Conjunctiva/sclera: Conjunctivae normal.     Pupils: Pupils are equal, round, and reactive to light.  Neck:     Musculoskeletal: Normal range of motion and neck supple.     Thyroid: No thyromegaly.     Vascular: No JVD.  Cardiovascular:     Rate and Rhythm: Normal rate and regular rhythm.     Pulses: Normal pulses.     Heart sounds: Normal heart sounds. No murmur. No friction rub. No gallop.   Pulmonary:     Effort: Pulmonary effort is normal.     Breath sounds: Normal breath sounds. No wheezing or rhonchi.  Abdominal:     General: Bowel sounds are normal.     Palpations: Abdomen is soft. There is no mass.     Tenderness: There is no abdominal tenderness. There is no right CVA tenderness, left CVA tenderness, guarding or rebound.     Hernia: No hernia is present.  Musculoskeletal: Normal range of motion.  Lymphadenopathy:     Cervical: No cervical adenopathy.  Skin:    General: Skin is warm and dry.     Capillary Refill: Capillary refill takes less than 2 seconds.  Neurological:     General: No focal deficit present.     Mental Status: She is alert.     Cranial Nerves: No cranial nerve deficit.     Deep Tendon Reflexes: Reflexes are normal and symmetric.     Wt Readings from Last 3 Encounters:  04/12/19 189 lb (85.7 kg)  03/07/19 192 lb (87.1 kg)  02/07/19 193 lb (87.5 kg)    BP 120/62   Pulse 64   Ht 5\' 2"  (1.575 m)   Wt 189 lb (85.7 kg)   BMI 34.57 kg/m   Assessment and Plan:  1. Adult hypothyroidism Patient with  history of hypothyroid for which she is currently taking 50 mcg of levothyroxine.  Will check TSH to see if this is sufficient and continue for the next 6 months if so. - levothyroxine (SYNTHROID) 50 MCG tablet; Take 1 tablet (50 mcg total) by mouth daily.  Dispense: 90 tablet; Refill: 1 - TSH  2. New onset type 2 diabetes mellitus (HCC) Chronic.  Controlled.  Patient will continue metformin 750 mg XR. - metFORMIN (GLUCOPHAGE-XR)  750 MG 24 hr tablet; Take 1 tablet (750 mg total) by mouth at bedtime. TAKE 1 TABLET BY MOUTH EVERY DAY WITH supper  Dispense: 90 tablet; Refill: 1  3. Type 2 diabetes mellitus without complication, without long-term current use of insulin (HCC) The recent addition of Victoza patient has had very good readings on her fasting blood glucose.  Patient is doing well on current dosing 0.6 of Victoza.  Will check an A1c and if necessary we will increase dose. - liraglutide (VICTOZA) 18 MG/3ML SOPN; Inject 0.1 mLs (0.6 mg total) into the skin daily.  Dispense: 3 pen; Refill: 1 - Hemoglobin A1c  4. Gastroesophageal reflux disease, esophagitis presence not specified Patient is followed at [facility] however would like to be able to have this done locally and we will continue her omeprazole 40 mg once a day.  Will review the notes from [place] as to verification that there is no Barrett's esophagitis noted. - omeprazole (PRILOSEC) 40 MG capsule; Take 1 capsule (40 mg total) by mouth daily.  Dispense: 90 capsule; Refill: 1

## 2019-04-13 LAB — HEMOGLOBIN A1C
Est. average glucose Bld gHb Est-mCnc: 143 mg/dL
Hgb A1c MFr Bld: 6.6 % — ABNORMAL HIGH (ref 4.8–5.6)

## 2019-04-13 LAB — TSH: TSH: 2.51 u[IU]/mL (ref 0.450–4.500)

## 2019-05-07 ENCOUNTER — Other Ambulatory Visit: Payer: Self-pay

## 2019-05-07 DIAGNOSIS — E119 Type 2 diabetes mellitus without complications: Secondary | ICD-10-CM

## 2019-05-07 MED ORDER — METFORMIN HCL ER 750 MG PO TB24: 750.0000 mg | ORAL_TABLET | Freq: Every day | ORAL | 0 refills | Status: DC

## 2019-05-18 ENCOUNTER — Other Ambulatory Visit: Payer: Self-pay | Admitting: Family Medicine

## 2019-05-18 DIAGNOSIS — R490 Dysphonia: Secondary | ICD-10-CM

## 2019-05-18 DIAGNOSIS — R05 Cough: Secondary | ICD-10-CM

## 2019-05-18 DIAGNOSIS — R053 Chronic cough: Secondary | ICD-10-CM

## 2019-05-20 ENCOUNTER — Other Ambulatory Visit: Payer: Self-pay | Admitting: Family Medicine

## 2019-05-20 DIAGNOSIS — R490 Dysphonia: Secondary | ICD-10-CM

## 2019-05-20 DIAGNOSIS — R053 Chronic cough: Secondary | ICD-10-CM

## 2019-05-20 DIAGNOSIS — R05 Cough: Secondary | ICD-10-CM

## 2019-05-24 ENCOUNTER — Other Ambulatory Visit: Payer: Self-pay | Admitting: Family Medicine

## 2019-05-24 DIAGNOSIS — R053 Chronic cough: Secondary | ICD-10-CM

## 2019-05-24 DIAGNOSIS — R490 Dysphonia: Secondary | ICD-10-CM

## 2019-05-24 DIAGNOSIS — R05 Cough: Secondary | ICD-10-CM

## 2019-05-25 ENCOUNTER — Other Ambulatory Visit: Payer: Self-pay | Admitting: Family Medicine

## 2019-06-04 ENCOUNTER — Ambulatory Visit: Payer: Self-pay | Admitting: Family Medicine

## 2019-06-12 ENCOUNTER — Other Ambulatory Visit: Payer: Self-pay | Admitting: Family Medicine

## 2019-06-12 DIAGNOSIS — E119 Type 2 diabetes mellitus without complications: Secondary | ICD-10-CM

## 2019-07-12 ENCOUNTER — Other Ambulatory Visit: Payer: Self-pay | Admitting: Family Medicine

## 2019-07-12 DIAGNOSIS — I10 Essential (primary) hypertension: Secondary | ICD-10-CM

## 2019-07-14 ENCOUNTER — Other Ambulatory Visit: Payer: Self-pay | Admitting: Family Medicine

## 2019-07-14 DIAGNOSIS — K219 Gastro-esophageal reflux disease without esophagitis: Secondary | ICD-10-CM

## 2019-08-09 ENCOUNTER — Other Ambulatory Visit: Payer: Self-pay | Admitting: Family Medicine

## 2019-08-09 DIAGNOSIS — K219 Gastro-esophageal reflux disease without esophagitis: Secondary | ICD-10-CM

## 2019-09-04 ENCOUNTER — Other Ambulatory Visit: Payer: Self-pay | Admitting: Family Medicine

## 2019-09-04 DIAGNOSIS — K219 Gastro-esophageal reflux disease without esophagitis: Secondary | ICD-10-CM

## 2019-09-05 ENCOUNTER — Other Ambulatory Visit: Payer: Self-pay

## 2019-09-05 DIAGNOSIS — E119 Type 2 diabetes mellitus without complications: Secondary | ICD-10-CM

## 2019-09-05 DIAGNOSIS — E782 Mixed hyperlipidemia: Secondary | ICD-10-CM

## 2019-09-05 MED ORDER — BD PEN NEEDLE NANO 2ND GEN 32G X 4 MM MISC: 0 refills | Status: DC

## 2019-09-05 MED ORDER — ATORVASTATIN CALCIUM 10 MG PO TABS: 10.0000 mg | ORAL_TABLET | Freq: Every day | ORAL | 1 refills | Status: DC

## 2019-09-05 NOTE — Progress Notes (Unsigned)
Sent in Atorv and pen needles

## 2019-09-11 ENCOUNTER — Other Ambulatory Visit: Payer: Self-pay | Admitting: Family Medicine

## 2019-09-29 ENCOUNTER — Other Ambulatory Visit: Payer: Self-pay | Admitting: Family Medicine

## 2019-09-29 DIAGNOSIS — E782 Mixed hyperlipidemia: Secondary | ICD-10-CM

## 2019-09-29 DIAGNOSIS — K219 Gastro-esophageal reflux disease without esophagitis: Secondary | ICD-10-CM

## 2019-10-16 ENCOUNTER — Ambulatory Visit: Payer: Medicare PPO | Admitting: Family Medicine

## 2019-10-16 ENCOUNTER — Encounter: Payer: Self-pay | Admitting: Family Medicine

## 2019-10-16 ENCOUNTER — Other Ambulatory Visit: Payer: Self-pay

## 2019-10-16 VITALS — BP 120/80 | HR 64 | Ht 62.0 in | Wt 194.0 lb

## 2019-10-16 DIAGNOSIS — I10 Essential (primary) hypertension: Secondary | ICD-10-CM | POA: Diagnosis not present

## 2019-10-16 DIAGNOSIS — J45991 Cough variant asthma: Secondary | ICD-10-CM

## 2019-10-16 DIAGNOSIS — E782 Mixed hyperlipidemia: Secondary | ICD-10-CM

## 2019-10-16 DIAGNOSIS — E119 Type 2 diabetes mellitus without complications: Secondary | ICD-10-CM

## 2019-10-16 DIAGNOSIS — E039 Hypothyroidism, unspecified: Secondary | ICD-10-CM | POA: Diagnosis not present

## 2019-10-16 DIAGNOSIS — J301 Allergic rhinitis due to pollen: Secondary | ICD-10-CM

## 2019-10-16 MED ORDER — METFORMIN HCL ER 750 MG PO TB24: 750.0000 mg | ORAL_TABLET | Freq: Every day | ORAL | 1 refills | Status: DC

## 2019-10-16 MED ORDER — HYDROCHLOROTHIAZIDE 25 MG PO TABS: 25.0000 mg | ORAL_TABLET | Freq: Every day | ORAL | 1 refills | Status: DC

## 2019-10-16 MED ORDER — LEVOTHYROXINE SODIUM 50 MCG PO TABS: 50.0000 ug | ORAL_TABLET | Freq: Every day | ORAL | 1 refills | Status: DC

## 2019-10-16 MED ORDER — ATORVASTATIN CALCIUM 10 MG PO TABS: 10.0000 mg | ORAL_TABLET | Freq: Every day | ORAL | 1 refills | Status: DC

## 2019-10-16 MED ORDER — MONTELUKAST SODIUM 10 MG PO TABS: 10.0000 mg | ORAL_TABLET | Freq: Every day | ORAL | 1 refills | Status: DC

## 2019-10-16 MED ORDER — ONETOUCH ULTRA VI STRP: ORAL_STRIP | 1 refills | Status: DC

## 2019-10-16 MED ORDER — VICTOZA 18 MG/3ML ~~LOC~~ SOPN: 0.6000 mg | PEN_INJECTOR | Freq: Every day | SUBCUTANEOUS | 1 refills | Status: DC

## 2019-10-16 NOTE — Patient Instructions (Addendum)
Raynaud Phenomenon  Raynaud phenomenon is a condition that affects the blood vessels (arteries) that carry blood to your fingers and toes. The arteries that supply blood to your ears, lips, nipples, or the tip of your nose might also be affected. Raynaud phenomenon causes the arteries to become narrow temporarily (spasm). As a result, the flow of blood to the affected areas is temporarily decreased. This usually occurs in response to cold temperatures or stress. During an attack, the skin in the affected areas turns white, then blue, and finally red. You may also feel tingling or numbness in those areas. Attacks usually last for only a brief period, and then the blood flow to the area returns to normal. In most cases, Raynaud phenomenon does not cause serious health problems. What are the causes? In many cases, the cause of this condition is not known. The condition may occur on its own (primary Raynaud phenomenon) or may be associated with other diseases or factors (secondary Raynaud phenomenon). Possible causes may include:  Diseases or medical conditions that damage the arteries.  Injuries and repetitive actions that hurt the hands or feet.  Being exposed to certain chemicals.  Taking medicines that narrow the arteries.  Other medical conditions, such as lupus, scleroderma, rheumatoid arthritis, thyroid problems, blood disorders, Sjogren syndrome, or atherosclerosis. What increases the risk? The following factors may make you more likely to develop this condition:  Being 20-40 years old.  Being female.  Having a family history of Raynaud phenomenon.  Living in a cold climate.  Smoking. What are the signs or symptoms? Symptoms of this condition usually occur when you are exposed to cold temperatures or when you have emotional stress. The symptoms may last for a few minutes or up to several hours. They usually affect your fingers but may also affect your toes, nipples, lips, ears, or  the tip of your nose. Symptoms may include:  Changes in skin color. The skin in the affected areas will turn pale or white. The skin may then change from white to bluish to red as normal blood flow returns to the area.  Numbness, tingling, or pain in the affected areas. In severe cases, symptoms may include:  Skin sores.  Tissues decaying and dying (gangrene). How is this diagnosed? This condition may be diagnosed based on:  Your symptoms and medical history.  A physical exam. During the exam, you may be asked to put your hands in cold water to check for a reaction to cold temperature.  Tests, such as: ? Blood tests to check for other diseases or conditions. ? A test to check the movement of blood through your arteries and veins (vascular ultrasound). ? A test in which the skin at the base of your fingernail is examined under a microscope (nailfold capillaroscopy). How is this treated? Treatment for this condition often involves making lifestyle changes and taking steps to control your exposure to cold temperatures. For more severe cases, medicine (calcium channel blockers) may be used to improve blood flow. Surgery is sometimes done to block the nerves that control the affected arteries, but this is rare. Follow these instructions at home: Avoiding cold temperatures Take these steps to avoid exposure to cold:  If possible, stay indoors during cold weather.  When you go outside during cold weather, dress in layers and wear mittens, a hat, a scarf, and warm footwear.  Wear mittens or gloves when handling ice or frozen food.  Use holders for glasses or cans containing cold drinks.    Let warm water run for a while before taking a shower or bath.  Warm up the car before driving in cold weather. Lifestyle   If possible, avoid stressful and emotional situations. Try to find ways to manage your stress, such as: ? Exercise. ? Yoga. ? Meditation. ? Biofeedback.  Do not use any  products that contain nicotine or tobacco, such as cigarettes and e-cigarettes. If you need help quitting, ask your health care provider.  Avoid secondhand smoke.  Limit your use of caffeine. ? Switch to decaffeinated coffee, tea, and soda. ? Avoid chocolate.  Avoid vibrating tools and machinery. General instructions  Protect your hands and feet from injuries, cuts, or bruises.  Avoid wearing tight rings or wristbands.  Wear loose fitting socks and comfortable, roomy shoes.  Take over-the-counter and prescription medicines only as told by your health care provider. Contact a health care provider if:  Your discomfort becomes worse despite lifestyle changes.  You develop sores on your fingers or toes that do not heal.  Your fingers or toes turn black.  You have breaks in the skin on your fingers or toes.  You have a fever.  You have pain or swelling in your joints.  You have a rash.  Your symptoms occur on only one side of your body. Summary  Raynaud phenomenon is a condition that affects the arteries that carry blood to your fingers, toes, ears, lips, nipples, or the tip of your nose.  In many cases, the cause of this condition is not known.  Symptoms of this condition include changes in skin color, and numbness and tingling of the affected area.  Treatment for this condition includes lifestyle changes, reducing exposure to cold temperatures, and using medicines for severe cases of the condition.  Contact your health care provider if your condition worsens despite treatment. This information is not intended to replace advice given to you by your health care provider. Make sure you discuss any questions you have with your health care provider. Document Revised: 09/09/2017 Document Reviewed: 10/18/2016 Elsevier Patient Education  2020 [employer].    Why follow it? Research shows. . Those who follow the Mediterranean diet have a reduced risk of heart disease  . The  diet is associated with a reduced incidence of Parkinson's and Alzheimer's diseases . People following the diet may have longer life expectancies and lower rates of chronic diseases  . The Dietary Guidelines for Americans recommends the Mediterranean diet as an eating plan to promote health and prevent disease  What Is the Mediterranean Diet?  . Healthy eating plan based on typical foods and recipes of Mediterranean-style cooking . The diet is primarily a plant based diet; these foods should make up a majority of meals   Starches - Plant based foods should make up a majority of meals - They are an important sources of vitamins, minerals, energy, antioxidants, and fiber - Choose whole grains, foods high in fiber and minimally processed items  - Typical grain sources include wheat, oats, barley, corn, brown rice, bulgar, farro, millet, polenta, couscous  - Various types of beans include chickpeas, lentils, fava beans, black beans, white beans   Fruits  Veggies - Large quantities of antioxidant rich fruits & veggies; 6 or more servings  - Vegetables can be eaten raw or lightly drizzled with oil and cooked  - Vegetables common to the traditional Mediterranean Diet include: artichokes, arugula, beets, broccoli, brussel sprouts, cabbage, carrots, celery, collard greens, cucumbers, eggplant, kale, leeks, lemons, lettuce,  mushrooms, okra, onions, peas, peppers, potatoes, pumpkin, radishes, rutabaga, shallots, spinach, sweet potatoes, turnips, zucchini - Fruits common to the Mediterranean Diet include: apples, apricots, avocados, cherries, clementines, dates, figs, grapefruits, grapes, melons, nectarines, oranges, peaches, pears, pomegranates, strawberries, tangerines  Fats - Replace butter and margarine with healthy oils, such as olive oil, canola oil, and tahini  - Limit nuts to no more than a handful a day  - Nuts include walnuts, almonds, pecans, pistachios, pine nuts  - Limit or avoid candied, honey  roasted or heavily salted nuts - Olives are central to the [employer] - can be eaten whole or used in a variety of dishes   Meats Protein - Limiting red meat: no more than a few times a month - When eating red meat: choose lean cuts and keep the portion to the size of deck of cards - Eggs: approx. 0 to 4 times a week  - Fish and lean poultry: at least 2 a week  - Healthy protein sources include, chicken, [place], lean beef, lamb - Increase intake of seafood such as tuna, salmon, trout, mackerel, shrimp, scallops - Avoid or limit high fat processed meats such as sausage and bacon  Dairy - Include moderate amounts of low fat dairy products  - Focus on healthy dairy such as fat free yogurt, skim milk, low or reduced fat cheese - Limit dairy products higher in fat such as whole or 2% milk, cheese, ice cream  Alcohol - Moderate amounts of red wine is ok  - No more than 5 oz daily for women (all ages) and men older than age 40  - No more than 10 oz of wine daily for men younger than 77  Other - Limit sweets and other desserts  - Use herbs and spices instead of salt to flavor foods  - Herbs and spices common to the traditional Mediterranean Diet include: basil, bay leaves, chives, cloves, cumin, fennel, garlic, lavender, marjoram, mint, oregano, parsley, pepper, rosemary, sage, savory, sumac, tarragon, thyme   It's not just a diet, it's a lifestyle:  . The Mediterranean diet includes lifestyle factors typical of those in the region  . Foods, drinks and meals are best eaten with others and savored . Daily physical activity is important for overall good health . This could be strenuous exercise like running and aerobics . This could also be more leisurely activities such as walking, housework, yard-work, or taking the stairs . Moderation is the key; a balanced and healthy diet accommodates most foods and drinks . Consider portion sizes and frequency of consumption of certain foods   Meal  Ideas & Options:  . Breakfast:  o Whole wheat toast or whole wheat English muffins with peanut butter & hard boiled egg o Steel cut oats topped with apples & cinnamon and skim milk  o Fresh fruit: banana, strawberries, melon, berries, peaches  o Smoothies: strawberries, bananas, greek yogurt, peanut butter o Low fat greek yogurt with blueberries and granola  o Egg white omelet with spinach and mushrooms o Breakfast couscous: whole wheat couscous, apricots, skim milk, cranberries  . Sandwiches:  o Hummus and grilled vegetables (peppers, zucchini, squash) on whole wheat bread   o Grilled chicken on whole wheat pita with lettuce, tomatoes, cucumbers or tzatziki  o Tuna salad on whole wheat bread: tuna salad made with greek yogurt, olives, red peppers, capers, green onions o Garlic rosemary lamb pita: lamb sauted with garlic, rosemary, salt & pepper; add lettuce, cucumber, greek yogurt to  pita - flavor with lemon juice and black pepper  . Seafood:  o Mediterranean grilled salmon, seasoned with garlic, basil, parsley, lemon juice and black pepper o Shrimp, lemon, and spinach whole-grain pasta salad made with low fat greek yogurt  o Seared scallops with lemon orzo  o Seared tuna steaks seasoned salt, pepper, coriander topped with tomato mixture of olives, tomatoes, olive oil, minced garlic, parsley, green onions and cappers  . Meats:  o Herbed greek chicken salad with kalamata olives, cucumber, feta  o Red bell peppers stuffed with spinach, bulgur, lean ground beef (or lentils) & topped with feta   o Kebabs: skewers of chicken, tomatoes, onions, zucchini, squash  o [place] burgers: made with red onions, mint, dill, lemon juice, feta cheese topped with roasted red peppers . Vegetarian o Cucumber salad: cucumbers, artichoke hearts, celery, red onion, feta cheese, tossed in olive oil & lemon juice  o Hummus and whole grain pita points with a greek salad (lettuce, tomato, feta, olives, cucumbers, red  onion) o Lentil soup with celery, carrots made with vegetable broth, garlic, salt and pepper  o Tabouli salad: parsley, bulgur, mint, scallions, cucumbers, tomato, radishes, lemon juice, olive oil, salt and pepper.

## 2019-10-16 NOTE — Progress Notes (Signed)
Date:  10/16/2019   Name:  Linda Vazquez   DOB:  1945-12-12   MRN:  [id number]   Chief Complaint: Diabetes, Hyperlipidemia, Hypertension, Hypothyroidism, and Allergic Rhinitis   Diabetes She presents for her follow-up diabetic visit. She has type 2 diabetes mellitus. Her disease course has been stable. Hypoglycemia symptoms include nervousness/anxiousness. Pertinent negatives for hypoglycemia include no dizziness, headaches or sweats. Pertinent negatives for diabetes include no blurred vision, no chest pain, no fatigue, no polydipsia, no polyphagia, no polyuria and no weakness. There are no hypoglycemic complications. Symptoms are stable. There are no diabetic complications. Risk factors for coronary artery disease include post-menopausal, hypertension, diabetes mellitus and dyslipidemia. Current diabetic treatment includes oral agent (monotherapy) (and victoza). Her weight is fluctuating minimally. She is following a generally healthy diet. Meal planning includes avoidance of concentrated sweets. She participates in exercise three times a week. Her home blood glucose trend is fluctuating minimally. Her breakfast blood glucose is taken between 8-9 am. Her lunch blood glucose range is generally 110-130 mg/dl. An ACE inhibitor/angiotensin II receptor blocker is not being taken. She does not see a podiatrist.Eye exam is current.  Hyperlipidemia This is a chronic problem. The current episode started more than 1 year ago. The problem is controlled. Recent lipid tests were reviewed and are normal. Exacerbating diseases include hypothyroidism. She has no history of chronic renal disease. Pertinent negatives include no chest pain, myalgias or shortness of breath. Current antihyperlipidemic treatment includes statins. The current treatment provides moderate improvement of lipids. There are no compliance problems.  Risk factors for coronary artery disease include dyslipidemia, diabetes mellitus and  hypertension.  Hypertension This is a chronic problem. The current episode started more than 1 year ago. The problem has been gradually improving since onset. The problem is controlled. Pertinent negatives include no anxiety, blurred vision, chest pain, headaches, malaise/fatigue, neck pain, orthopnea, palpitations, peripheral edema, PND, shortness of breath or sweats. There are no associated agents to hypertension. The current treatment provides moderate improvement. There are no compliance problems.  Identifiable causes of hypertension include a thyroid problem. There is no history of chronic renal disease, a hypertension causing med or renovascular disease.  Thyroid Problem Presents for follow-up visit. Symptoms include anxiety, cold intolerance, hair loss and weight gain. Patient reports no constipation, depressed mood, diaphoresis, diarrhea, dry skin, fatigue, heat intolerance, hoarse voice, menstrual problem, nail problem or palpitations. The symptoms have been stable. Her past medical history is significant for hyperlipidemia.  URI  This is a chronic (for allergic rhinitis) problem. The current episode started more than 1 year ago. The problem has been gradually improving. Associated symptoms include congestion and rhinorrhea. Pertinent negatives include no abdominal pain, chest pain, coughing, diarrhea, dysuria, ear pain, headaches, nausea, neck pain, rash, sneezing, sore throat, vomiting or wheezing.    Lab Results  Component Value Date   CREATININE 0.95 11/03/2018   BUN 25 11/03/2018   NA 139 11/03/2018   K 4.3 11/03/2018   CL 95 (L) 11/03/2018   CO2 20 11/03/2018   Lab Results  Component Value Date   CHOL 201 (H) 01/17/2019   HDL 66 01/17/2019   LDLCALC 111 (H) 01/17/2019   TRIG 119 01/17/2019   CHOLHDL 3.0 01/17/2019   Lab Results  Component Value Date   TSH 2.510 04/12/2019   Lab Results  Component Value Date   HGBA1C 6.6 (H) 04/12/2019     Review of Systems   Constitutional: Positive for weight gain. Negative for chills,  diaphoresis, fatigue, fever, malaise/fatigue and unexpected weight change.  HENT: Positive for congestion and rhinorrhea. Negative for ear discharge, ear pain, hoarse voice, sinus pressure, sneezing and sore throat.   Eyes: Negative for blurred vision, photophobia, pain, discharge, redness and itching.  Respiratory: Negative for cough, shortness of breath, wheezing and stridor.   Cardiovascular: Negative for chest pain, palpitations, orthopnea and PND.  Gastrointestinal: Negative for abdominal pain, blood in stool, constipation, diarrhea, nausea and vomiting.  Endocrine: Positive for cold intolerance. Negative for heat intolerance, polydipsia, polyphagia and polyuria.  Genitourinary: Negative for dysuria, flank pain, frequency, hematuria, menstrual problem, pelvic pain, urgency, vaginal bleeding and vaginal discharge.  Musculoskeletal: Negative for arthralgias, back pain, myalgias and neck pain.  Skin: Negative for rash.  Allergic/Immunologic: Negative for environmental allergies and food allergies.  Neurological: Negative for dizziness, weakness, light-headedness, numbness and headaches.       ?neuropathy  Hematological: Negative for adenopathy. Does not bruise/bleed easily.  Psychiatric/Behavioral: Negative for dysphoric mood. The patient is nervous/anxious.     Patient Active Problem List   Diagnosis Date Noted  . Venous stasis dermatitis of left lower extremity 03/07/2019  . Venous insufficiency of both lower extremities 03/07/2019  . Obesity (BMI 35.0-39.9 without comorbidity) 03/07/2019  . Type 2 diabetes mellitus without complication, without long-term current use of insulin ([place]) 01/17/2019  . Nontraumatic complete tear of right rotator cuff 12/13/2018  . Rotator cuff tendinitis, right 12/13/2018  . Asthma, cough variant 05/29/2015  . 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 Chronic cough 10/31/2014  . Esophageal  dysfunction 09/23/2014  . 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 BP (high blood pressure) 06/04/2013  . Dysphonia 03/01/2013  . Adult hypothyroidism 03/01/2013  . Malignant neoplasm of breast ([place]) 02/09/2013  . Acquired lymphedema 02/09/2013  . 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 Ace Inhibitors Cough    Per pt bp med caused cough possible ace inhibitor  . Sulfa Antibiotics Rash    Other Reaction: Not Assessed    Past Surgical History:  Procedure Laterality Date  . BIOPSY THYROID     benign  . BREAST LUMPECTOMY Right   . COLONOSCOPY  2006   repeat in 10 yrs- [facility] Dr  . KNEE ARTHROSCOPY WITH MENISCAL REPAIR Bilateral   . MASTECTOMY Left   . MASTECTOMY Right    2 yrs later  . MEDIASTINAL MASS EXCISION    . 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 TONSILLECTOMY    . tubal cauterization      Social History   Tobacco Use  . Smoking status: Never Smoker  . Smokeless tobacco: Never Used  . Tobacco comment: Smoking cessation materials not required  Substance Use Topics  . Alcohol use: Yes    Alcohol/week: 0.0 standard drinks    Comment: socially 1-2 x month  . Drug use: No     Medication list has been reviewed and updated.  Current Meds  Medication Sig  . amitriptyline (ELAVIL) 10 MG tablet Take 1 tablet by mouth at bedtime as needed. Dr Rodolph Bong  . ammonium lactate (LAC-HYDRIN) 12 % lotion Apply 1 application topically 2 (two) times daily. Dr Nehemiah Massed  . aspirin EC 81 MG tablet Take 1 tablet by mouth daily.  Marland Kitchen atorvastatin (LIPITOR) 10 MG tablet TAKE 1 TABLET BY MOUTH EVERY DAY  . azelastine (ASTELIN) 0.1 % nasal spray Place 1 spray into the nose 2 (two) times daily. [facility]  .  benzonatate (TESSALON) 100 MG capsule TAKE 1 CAPSULE (100 MG TOTAL) BY MOUTH 2 (TWO) TIMES DAILY AS NEEDED  . carbamide peroxide (DEBROX) 6.5 % OTIC solution Place 5 drops into both ears 2 (two) times daily. (Patient taking  differently: Place 5 drops into both ears 2 (two) times daily as needed. )  . cetirizine-pseudoephedrine (ZYRTEC-D) 5-120 MG tablet Take 1 tablet by mouth daily. OTC  . clonazePAM (KLONOPIN) 0.5 MG tablet Take 0.5 mg by mouth daily. Otolaryn. DR Patrice Paradise  . DULERA 100-5 MCG/ACT AERO   . hydrochlorothiazide (HYDRODIURIL) 25 MG tablet TAKE 1 TABLET BY MOUTH EVERY DAY  . Insulin Pen Needle (BD PEN NEEDLE NANO 2ND GEN) 32G X 4 MM MISC One injection daily  . levothyroxine (SYNTHROID) 50 MCG tablet Take 1 tablet (50 mcg total) by mouth daily.  Marland Kitchen liraglutide (VICTOZA) 18 MG/3ML SOPN Inject 0.1 mLs (0.6 mg total) into the skin daily.  . metFORMIN (GLUCOPHAGE-XR) 750 MG 24 hr tablet Take 1 tablet (750 mg total) by mouth at bedtime. TAKE 1 TABLET BY MOUTH EVERY DAY WITH supper  . montelukast (SINGULAIR) 10 MG tablet Take 1 tablet (10 mg total) by mouth at bedtime.  . Multiple Vitamin (MULTIVITAMIN WITH MINERALS) TABS tablet Take 1 tablet by mouth daily.  Marland Kitchen omeprazole (PRILOSEC) 40 MG capsule TAKE 1 CAPSULE BY MOUTH EVERY DAY  . OneTouch Delica Lancets 99991111 MISC USE AS DIRECTED DAILY  . ONETOUCH ULTRA test strip USE AS DIRECTED EVERY DAY  . Probiotic Product (PROBIOTIC DAILY PO) Take 1 each by mouth daily. gummie  . Vitamin D, Cholecalciferol, 50 MCG (2000 UT) CAPS Take 1 capsule by mouth daily.    PHQ 2/9 Scores 10/16/2019 04/12/2019 03/07/2019 02/07/2019  PHQ - 2 Score 0 0 0 0  PHQ- 9 Score 0 0 0 -    BP Readings from Last 3 Encounters:  10/16/19 120/80  04/12/19 120/62  03/07/19 112/62    Physical Exam Vitals and nursing note reviewed.  Constitutional:      General: She is not in acute distress.    Appearance: She is not diaphoretic.  HENT:     Head: Normocephalic and atraumatic.     Right Ear: External ear normal.     Left Ear: External ear normal.     Nose: Nose normal.  Eyes:     General:        Right eye: No discharge.        Left eye: No discharge.     Conjunctiva/sclera: Conjunctivae  normal.     Pupils: Pupils are equal, round, and reactive to light.  Neck:     Thyroid: No thyromegaly.     Vascular: No JVD.  Cardiovascular:     Rate and Rhythm: Normal rate and regular rhythm.     Heart sounds: Normal heart sounds. No murmur. No friction rub. No gallop.   Pulmonary:     Effort: Pulmonary effort is normal.     Breath sounds: Normal breath sounds.  Abdominal:     General: Bowel sounds are normal.     Palpations: Abdomen is soft. There is no mass.     Tenderness: There is no abdominal tenderness. There is no guarding.  Musculoskeletal:        General: Normal range of motion.     Cervical back: Normal range of motion and neck supple.  Lymphadenopathy:     Cervical: No cervical adenopathy.  Skin:    General: Skin is warm and dry.  Neurological:  Mental Status: She is alert.     Deep Tendon Reflexes: Reflexes are normal and symmetric.     Wt Readings from Last 3 Encounters:  10/16/19 194 lb (88 kg)  04/12/19 189 lb (85.7 kg)  03/07/19 192 lb (87.1 kg)    BP 120/80   Pulse 64   Ht 5\' 2"  (1.575 m)   Wt 194 lb (88 kg)   BMI 35.48 kg/m   Assessment and Plan: 1. 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  Controlled.  Stable.  We will continue Singulair 10 mg once a day. - montelukast (SINGULAIR) 10 MG tablet; Take 1 tablet (10 mg total) by mouth at bedtime.  Dispense: 90 tablet; Refill: 1  2. 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 - montelukast (SINGULAIR) 10 MG tablet; Take 1 tablet (10 mg total) by mouth at bedtime.  Dispense: 90 tablet; Refill: 1  3. New onset type 2 diabetes mellitus ([place]) New onset.  Stable.  Controlled.  Patient doing well with a combination of Victoza and extended release Metformin.  Will continue Glucophage Exar 750 mg once a day at bedtime and Victoza.  Will check A1c  and microalbuminuria. - HgB A1c - Microalbumin, urine - Comprehensive Metabolic Panel (CMET) - metFORMIN (GLUCOPHAGE-XR) 750 MG 24 hr tablet; Take 1 tablet (750 mg total) by mouth at bedtime. TAKE 1 TABLET BY MOUTH EVERY DAY WITH supper  Dispense: 90 tablet; Refill: 1  4. Type 2 diabetes mellitus without complication, without long-term current use of insulin ([place]) As noted above ambulatory is having continuous to note that she is on Victoza taking 0.1 mL or the equivalent of 0.6 mcg daily. - glucose blood (ONETOUCH ULTRA) test strip; Test one time daily  Dispense: 100 strip; Refill: 1 - liraglutide (VICTOZA) 18 MG/3ML SOPN; Inject 0.1 mLs (0.6 mg total) into the skin daily.  Dispense: 3 pen; Refill: 1  5. Adult hypothyroidism Chronic.  Controlled.  Stable.  Continue levothyroxine 50 mcg daily.  Will check thyroid panel with TSH to assess level of control. - levothyroxine (SYNTHROID) 50 MCG tablet; Take 1 tablet (50 mcg total) by mouth daily.  Dispense: 90 tablet; Refill: 1 - Thyroid Panel With TSH  6. Mixed hyperlipidemia Chronic.  Controlled.  Stable.  Continue atorvastatin 10 mg once a day.  Patient is tolerating well with no myalgias.  Will check lipid panel with ratio to determine stability of control. - Lipid Panel With LDL/HDL Ratio - atorvastatin (LIPITOR) 10 MG tablet; Take 1 tablet (10 mg total) by mouth daily.  Dispense: 90 tablet; Refill: 1  7. Essential hypertension Chronic.  Controlled.  Stable.  Will continue hydrochlorothiazide 25 mg once a day.  And will check comprehensive metabolic panel to assess GFR and electrolytes. - Comprehensive Metabolic Panel (CMET) - hydrochlorothiazide (HYDRODIURIL) 25 MG tablet; Take 1 tablet (25 mg total) by mouth daily.  Dispense: 90 tablet; Refill: 1

## 2019-10-17 LAB — THYROID PANEL WITH TSH
Free Thyroxine Index: 2.8 (ref 1.2–4.9)
T3 Uptake Ratio: 28 % (ref 24–39)
T4, Total: 10.1 ug/dL (ref 4.5–12.0)
TSH: 2.65 u[IU]/mL (ref 0.450–4.500)

## 2019-10-17 LAB — COMPREHENSIVE METABOLIC PANEL
ALT: 15 IU/L (ref 0–32)
AST: 19 IU/L (ref 0–40)
Albumin/Globulin Ratio: 2.1 (ref 1.2–2.2)
Albumin: 4.4 g/dL (ref 3.7–4.7)
Alkaline Phosphatase: 105 IU/L (ref 39–117)
BUN/Creatinine Ratio: 19 (ref 12–28)
BUN: 20 mg/dL (ref 8–27)
Bilirubin Total: 0.4 mg/dL (ref 0.0–1.2)
CO2: 23 mmol/L (ref 20–29)
Calcium: 9.5 mg/dL (ref 8.7–10.3)
Chloride: 105 mmol/L (ref 96–106)
Creatinine, Ser: 1.03 mg/dL — ABNORMAL HIGH (ref 0.57–1.00)
GFR calc Af Amer: 61 mL/min/{1.73_m2} (ref >59)
GFR calc non Af Amer: 53 mL/min/{1.73_m2} — ABNORMAL LOW (ref >59)
Globulin, Total: 2.1 g/dL (ref 1.5–4.5)
Glucose: 93 mg/dL (ref 65–99)
Potassium: 4.7 mmol/L (ref 3.5–5.2)
Sodium: 145 mmol/L — ABNORMAL HIGH (ref 134–144)
Total Protein: 6.5 g/dL (ref 6.0–8.5)

## 2019-10-17 LAB — LIPID PANEL WITH LDL/HDL RATIO
Cholesterol, Total: 152 mg/dL (ref 100–199)
HDL: 68 mg/dL (ref >39)
LDL Chol Calc (NIH): 64 mg/dL (ref 0–99)
LDL/HDL Ratio: 0.9 ratio (ref 0.0–3.2)
Triglycerides: 116 mg/dL (ref 0–149)
VLDL Cholesterol Cal: 20 mg/dL (ref 5–40)

## 2019-10-17 LAB — HEMOGLOBIN A1C
Est. average glucose Bld gHb Est-mCnc: 131 mg/dL
Hgb A1c MFr Bld: 6.2 % — ABNORMAL HIGH (ref 4.8–5.6)

## 2019-10-17 LAB — MICROALBUMIN, URINE: Microalbumin, Urine: 8.7 ug/mL

## 2019-10-18 ENCOUNTER — Encounter: Payer: Self-pay | Admitting: *Deleted

## 2019-10-18 NOTE — Progress Notes (Signed)
Patient sent letter regarding her health with her 6 month goals sheet. Phone call made to her as requested in her letter. Since she was seen last May for Diabetes education she has made many improvements. Lab results from PCP this week shows lower A1C (6.2 %) and cholesterol levels in normal range. Encouraged her to continue with good habits and call for any questions.

## 2019-12-01 ENCOUNTER — Other Ambulatory Visit: Payer: Self-pay | Admitting: Family Medicine

## 2019-12-01 DIAGNOSIS — K219 Gastro-esophageal reflux disease without esophagitis: Secondary | ICD-10-CM

## 2019-12-06 ENCOUNTER — Other Ambulatory Visit: Payer: Self-pay | Admitting: Family Medicine

## 2019-12-06 DIAGNOSIS — E119 Type 2 diabetes mellitus without complications: Secondary | ICD-10-CM

## 2019-12-08 ENCOUNTER — Other Ambulatory Visit: Payer: Self-pay | Admitting: Family Medicine

## 2019-12-08 DIAGNOSIS — E119 Type 2 diabetes mellitus without complications: Secondary | ICD-10-CM

## 2019-12-14 ENCOUNTER — Ambulatory Visit: Payer: Medicare PPO | Admitting: Family Medicine

## 2019-12-14 ENCOUNTER — Encounter: Payer: Self-pay | Admitting: Family Medicine

## 2019-12-14 ENCOUNTER — Other Ambulatory Visit: Payer: Self-pay

## 2019-12-14 VITALS — BP 120/80 | HR 80 | Ht 62.0 in | Wt 190.0 lb

## 2019-12-14 DIAGNOSIS — L570 Actinic keratosis: Secondary | ICD-10-CM | POA: Diagnosis not present

## 2019-12-14 DIAGNOSIS — M75121 Complete rotator cuff tear or rupture of right shoulder, not specified as traumatic: Secondary | ICD-10-CM | POA: Diagnosis not present

## 2019-12-14 DIAGNOSIS — R079 Chest pain, unspecified: Secondary | ICD-10-CM | POA: Diagnosis not present

## 2019-12-14 DIAGNOSIS — M940 Chondrocostal junction syndrome [Tietze]: Secondary | ICD-10-CM | POA: Diagnosis not present

## 2019-12-14 MED ORDER — TRIAMCINOLONE ACETONIDE 0.1 % EX CREA: 1.0000 "application " | TOPICAL_CREAM | Freq: Two times a day (BID) | CUTANEOUS | 0 refills | Status: DC

## 2019-12-14 MED ORDER — MELOXICAM 7.5 MG PO TABS: 7.5000 mg | ORAL_TABLET | Freq: Every day | ORAL | 0 refills | Status: DC

## 2019-12-14 NOTE — Progress Notes (Signed)
Date:  12/14/2019   Name:  Linda Vazquez   DOB:  04-Dec-1945   MRN:  [id number]   Chief Complaint: Chest Pain (feels like "I have pulled a muscle across the chest" )  Chest Pain  This is a new problem. The current episode started more than 1 month ago. The onset quality is gradual. The problem occurs constantly. The problem has been waxing and waning. The pain is present in the substernal region. The pain is at a severity of 7/10. The pain is moderate. The quality of the pain is described as sharp. The pain does not radiate. Associated symptoms include exertional chest pressure. Pertinent negatives include no abdominal pain, back pain, cough, dizziness, fever, headaches, hemoptysis, irregular heartbeat, nausea, near-syncope, numbness, palpitations, shortness of breath, vomiting or weakness.    Lab Results  Component Value Date   CREATININE 1.03 (H) 10/16/2019   BUN 20 10/16/2019   NA 145 (H) 10/16/2019   K 4.7 10/16/2019   CL 105 10/16/2019   CO2 23 10/16/2019   Lab Results  Component Value Date   CHOL 152 10/16/2019   HDL 68 10/16/2019   LDLCALC 64 10/16/2019   TRIG 116 10/16/2019   CHOLHDL 3.0 01/17/2019   Lab Results  Component Value Date   TSH 2.650 10/16/2019   Lab Results  Component Value Date   HGBA1C 6.2 (H) 10/16/2019   No results found for: WBC, HGB, HCT, MCV, PLT Lab Results  Component Value Date   ALT 15 10/16/2019   AST 19 10/16/2019   ALKPHOS 105 10/16/2019   BILITOT 0.4 10/16/2019     Review of Systems  Constitutional: Negative.  Negative for chills, fatigue, fever and unexpected weight change.  HENT: Negative for congestion, ear discharge, ear pain, rhinorrhea, sinus pressure, sneezing and sore throat.   Eyes: Negative for photophobia, pain, discharge, redness and itching.  Respiratory: Negative for cough, hemoptysis, shortness of breath, wheezing and stridor.   Cardiovascular: Positive for chest pain. Negative for palpitations and near-syncope.   Gastrointestinal: Negative for abdominal pain, blood in stool, constipation, diarrhea, nausea and vomiting.  Endocrine: Negative for cold intolerance, heat intolerance, polydipsia, polyphagia and polyuria.  Genitourinary: Negative for dysuria, flank pain, frequency, hematuria, menstrual problem, pelvic pain, urgency, vaginal bleeding and vaginal discharge.  Musculoskeletal: Negative for arthralgias, back pain and myalgias.  Skin: Negative for rash.  Allergic/Immunologic: Negative for environmental allergies and food allergies.  Neurological: Negative for dizziness, weakness, light-headedness, numbness and headaches.  Hematological: Negative for adenopathy. Does not bruise/bleed easily.  Psychiatric/Behavioral: Negative for dysphoric mood. The patient is not nervous/anxious.     Patient Active Problem List   Diagnosis Date Noted  . Venous stasis dermatitis of left lower extremity 03/07/2019  . Venous insufficiency of both lower extremities 03/07/2019  . Obesity (BMI 35.0-39.9 without comorbidity) 03/07/2019  . Type 2 diabetes mellitus without complication, without long-term current use of insulin ([place]) 01/17/2019  . Nontraumatic complete tear of right rotator cuff 12/13/2018  . Rotator cuff tendinitis, right 12/13/2018  . Asthma, cough variant 05/29/2015  . 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 Chronic cough 10/31/2014  . Esophageal dysfunction 09/23/2014  . 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 BP (high blood pressure) 06/04/2013  . Dysphonia 03/01/2013  . Adult hypothyroidism 03/01/2013  . Malignant neoplasm of breast ([place]) 02/09/2013  . Acquired lymphedema 02/09/2013  . 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 Ace Inhibitors Cough    Per pt bp med  caused cough possible ace inhibitor  . Sulfa Antibiotics Rash    Other Reaction: Not Assessed    Past Surgical History:  Procedure Laterality Date  . BIOPSY THYROID     benign  . BREAST LUMPECTOMY  Right   . COLONOSCOPY  2006   repeat in 10 yrs- [facility] Dr  . KNEE ARTHROSCOPY WITH MENISCAL REPAIR Bilateral   . MASTECTOMY Left   . MASTECTOMY Right    2 yrs later  . MEDIASTINAL MASS EXCISION    . 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 TONSILLECTOMY    . tubal cauterization      Social History   Tobacco Use  . Smoking status: Never Smoker  . Smokeless tobacco: Never Used  . Tobacco comment: Smoking cessation materials not required  Substance Use Topics  . Alcohol use: Yes    Alcohol/week: 0.0 standard drinks    Comment: socially 1-2 x month  . Drug use: No     Medication list has been reviewed and updated.  Current Meds  Medication Sig  . ACCU-CHEK GUIDE test strip TEST EVERY DAY  . amitriptyline (ELAVIL) 10 MG tablet Take 1 tablet by mouth at bedtime as needed. Dr Rodolph Bong  . ammonium lactate (LAC-HYDRIN) 12 % lotion Apply 1 application topically 2 (two) times daily. Dr Nehemiah Massed  . aspirin EC 81 MG tablet Take 1 tablet by mouth daily.  Marland Kitchen atorvastatin (LIPITOR) 10 MG tablet Take 1 tablet (10 mg total) by mouth daily.  Marland Kitchen azelastine (ASTELIN) 0.1 % nasal spray Place 1 spray into the nose 2 (two) times daily. [facility]  . BD PEN NEEDLE NANO 2ND GEN 32G X 4 MM MISC ONE INJECTION DAILY  . benzonatate (TESSALON) 100 MG capsule TAKE 1 CAPSULE (100 MG TOTAL) BY MOUTH 2 (TWO) TIMES DAILY AS NEEDED  . carbamide peroxide (DEBROX) 6.5 % OTIC solution Place 5 drops into both ears 2 (two) times daily. (Patient taking differently: Place 5 drops into both ears 2 (two) times daily as needed. )  . cetirizine-pseudoephedrine (ZYRTEC-D) 5-120 MG tablet Take 1 tablet by mouth daily. OTC  . clonazePAM (KLONOPIN) 0.5 MG tablet Take 0.5 mg by mouth daily. Otolaryn. DR Patrice Paradise  . DULERA 100-5 MCG/ACT AERO   . hydrochlorothiazide (HYDRODIURIL) 25 MG tablet Take 1 tablet (25 mg total) by mouth daily.  Marland Kitchen levothyroxine (SYNTHROID) 50 MCG tablet Take 1 tablet (50 mcg total) by  mouth daily.  Marland Kitchen liraglutide (VICTOZA) 18 MG/3ML SOPN Inject 0.1 mLs (0.6 mg total) into the skin daily.  . metFORMIN (GLUCOPHAGE-XR) 750 MG 24 hr tablet Take 1 tablet (750 mg total) by mouth at bedtime. TAKE 1 TABLET BY MOUTH EVERY DAY WITH supper  . montelukast (SINGULAIR) 10 MG tablet Take 1 tablet (10 mg total) by mouth at bedtime.  . Multiple Vitamin (MULTIVITAMIN WITH MINERALS) TABS tablet Take 1 tablet by mouth daily.  Marland Kitchen omeprazole (PRILOSEC) 40 MG capsule TAKE 1 CAPSULE BY MOUTH EVERY DAY  . OneTouch Delica Lancets 54Y MISC USE AS DIRECTED DAILY  . Probiotic Product (PROBIOTIC DAILY PO) Take 1 each by mouth daily. gummie  . Vitamin D, Cholecalciferol, 50 MCG (2000 UT) CAPS Take 1 capsule by mouth daily.    PHQ 2/9 Scores 10/16/2019 04/12/2019 03/07/2019 02/07/2019  PHQ - 2 Score 0 0 0 0  PHQ- 9 Score 0 0 0 -    BP Readings from Last 3 Encounters:  12/14/19 120/80  10/16/19 120/80  04/12/19 120/62    Physical Exam Vitals and nursing note reviewed.  Constitutional:      Appearance: She is well-developed.  HENT:     Head: Normocephalic.     Right Ear: External ear normal.     Left Ear: External ear normal.  Eyes:     General: Lids are everted, no foreign bodies appreciated. No scleral icterus.       Left eye: No foreign body or hordeolum.     Conjunctiva/sclera: Conjunctivae normal.     Right eye: Right conjunctiva is not injected.     Left eye: Left conjunctiva is not injected.     Pupils: Pupils are equal, round, and reactive to light.  Neck:     Thyroid: No thyromegaly.     Vascular: No JVD.     Trachea: No tracheal deviation.  Cardiovascular:     Rate and Rhythm: Normal rate and regular rhythm.     Heart sounds: Normal heart sounds. No murmur. No friction rub. No gallop.   Pulmonary:     Effort: Pulmonary effort is normal. No respiratory distress.     Breath sounds: Normal breath sounds. No wheezing or rales.  Chest:     Chest wall: Tenderness present. No mass or  lacerations.       Comments: Tender along the left costochondral margins 3 4 and fifth left subchondral joints. Abdominal:     General: Bowel sounds are normal.     Palpations: Abdomen is soft. There is no mass.     Tenderness: There is no abdominal tenderness. There is no guarding or rebound.  Musculoskeletal:     Right shoulder: Tenderness present. Decreased range of motion.     Cervical back: Normal range of motion and neck supple.  Lymphadenopathy:     Cervical: No cervical adenopathy.  Skin:    General: Skin is warm.     Findings: No rash.     Comments: Scaly area on the right posterior deltoid consistent with actinic keratoses.  Neurological:     Mental Status: She is alert and oriented to person, place, and time.     Cranial Nerves: No cranial nerve deficit.     Deep Tendon Reflexes: Reflexes normal.  Psychiatric:        Mood and Affect: Mood is not anxious or depressed.     Wt Readings from Last 3 Encounters:  12/14/19 190 lb (86.2 kg)  10/16/19 194 lb (88 kg)  04/12/19 189 lb (85.7 kg)    BP 120/80   Pulse 80   Ht '5\' 2"'  (1.575 m)   Wt 190 lb (86.2 kg)   BMI 34.75 kg/m   Assessment and Plan: 1. Chest pain at rest Patient with new onset chest pain substernal across the chest.  This is occurs at rest.  It is relatively stable.  EKG was obtained for rule out of ischemic heart concerns.I have reviewed EKG which shows EKG was obtained and patient was noted to be in sinus rhythm with a rate of 84.  Other than some left atrial enlargement and low voltage in the precordial leads patient has an unremarkable EKG.  Intervals are normal as well as there is no LVH criteria voltage met.  There were no ischemic changes such as Q waves, R wave progression concerns, nor ST-T wave changes.. Comparison to previous EKG dated none available for comparison. - EKG 12-Lead  2. Costochondritis Exam is consistent with costochondritis.  This is acute.  Persistent.  And tenderness  is noted  over the left second third fourth and fifth costochondral joints.  We will initiate meloxicam 7.5 mg twice a day for anti-inflammatory and pain relief. - meloxicam (MOBIC) 7.5 MG tablet; Take 1 tablet (7.5 mg total) by mouth daily. Take one tablet twice a day  Dispense: 30 tablet; Refill: 0  3. Nontraumatic complete tear of right rotator cuff Patient has been previously evaluated for rotator cuff tear and possible tendinitis by Dr. Caprice Beaver.  Patient continues to have right shoulder pain with limited range of motion.  We will reassume meloxicam 7.5 mg twice a day for pain and anti-inflammatory and refer to Dr. Marton Redwood for reevaluation. - meloxicam (MOBIC) 7.5 MG tablet; Take 1 tablet (7.5 mg total) by mouth daily. Take one tablet twice a day  Dispense: 30 tablet; Refill: 0  4. Actinic keratosis New onset.  Patient does have a history of melanoma and is concerned with an area on the deltoid of the right shoulder that is somewhat scaly but nonpigmented.  Previous melanoma had some pruritus and scaliness.  Patient is to recontact her dermatologist Dr. Nehemiah Massed for reevaluation.  In the meantime to aid with the symptoms of pruritus patient has been given some triamcinolone cream to apply twice a day.  L - triamcinolone cream (KENALOG) 0.1 %; Apply 1 application topically 2 (two) times daily.  Dispense: 30 g; Refill: 0

## 2019-12-14 NOTE — Patient Instructions (Signed)
Costochondritis  Costochondritis is swelling and irritation (inflammation) of the tissue (cartilage) that connects your ribs to your breastbone (sternum). This causes pain in the front of your chest. The pain usually starts gradually and involves more than one rib. What are the causes? The exact cause of this condition is not always known. It results from stress on the cartilage where your ribs attach to your sternum. The cause of this stress could be:  Chest injury (trauma).  Exercise or activity, such as lifting.  Severe coughing. What increases the risk? You may be at higher risk for this condition if you:  Are female.  Are 30?76 years old.  Recently started a new exercise or work activity.  Have low levels of vitamin D.  Have a condition that makes you cough frequently. What are the signs or symptoms? The main symptom of this condition is chest pain. The pain:  Usually starts gradually and can be sharp or dull.  Gets worse with deep breathing, coughing, or exercise.  Gets better with rest.  May be worse when you press on the sternum-rib connection (tenderness). How is this diagnosed? This condition is diagnosed based on your symptoms, medical history, and a physical exam. Your health care provider will check for tenderness when pressing on your sternum. This is the most important finding. You may also have tests to rule out other causes of chest pain. These may include:  A chest X-ray to check for lung problems.  An electrocardiogram (ECG) to see if you have a heart problem that could be causing the pain.  An imaging scan to rule out a chest or rib fracture. How is this treated? This condition usually goes away on its own over time. Your health care provider may prescribe an NSAID to reduce pain and inflammation. Your health care provider may also suggest that you:  Rest and avoid activities that make pain worse.  Apply heat or cold to the area to reduce pain and  inflammation.  Do exercises to stretch your chest muscles. If these treatments do not help, your health care provider may inject a numbing medicine at the sternum-rib connection to help relieve the pain. Follow these instructions at home:  Avoid activities that make pain worse. This includes any activities that use chest, abdominal, and side muscles.  If directed, put ice on the painful area: ? Put ice in a plastic bag. ? Place a towel between your skin and the bag. ? Leave the ice on for 20 minutes, 2-3 times a day.  If directed, apply heat to the affected area as often as told by your health care provider. Use the heat source that your health care provider recommends, such as a moist heat pack or a heating pad. ? Place a towel between your skin and the heat source. ? Leave the heat on for 20-30 minutes. ? Remove the heat if your skin turns bright red. This is especially important if you are unable to feel pain, heat, or cold. You may have a greater risk of getting burned.  Take over-the-counter and prescription medicines only as told by your health care provider.  Return to your normal activities as told by your health care provider. Ask your health care provider what activities are safe for you.  Keep all follow-up visits as told by your health care provider. This is important. Contact a health care provider if:  You have chills or a fever.  Your pain does not go away or it gets   worse.  You have a cough that does not go away (is persistent). Get help right away if:  You have shortness of breath. This information is not intended to replace advice given to you by your health care provider. Make sure you discuss any questions you have with your health care provider. Document Revised: 09/21/2017 Document Reviewed: 12/31/2015 Elsevier Patient Education  2020 [employer]  

## 2019-12-18 DIAGNOSIS — E669 Obesity, unspecified: Secondary | ICD-10-CM | POA: Diagnosis not present

## 2019-12-18 DIAGNOSIS — M5412 Radiculopathy, cervical region: Secondary | ICD-10-CM | POA: Diagnosis not present

## 2019-12-18 DIAGNOSIS — M7581 Other shoulder lesions, right shoulder: Secondary | ICD-10-CM | POA: Diagnosis not present

## 2019-12-18 DIAGNOSIS — M25511 Pain in right shoulder: Secondary | ICD-10-CM | POA: Diagnosis not present

## 2019-12-27 ENCOUNTER — Other Ambulatory Visit: Payer: Self-pay

## 2019-12-27 ENCOUNTER — Other Ambulatory Visit: Payer: Self-pay | Admitting: Family Medicine

## 2019-12-27 ENCOUNTER — Telehealth: Payer: Self-pay

## 2019-12-27 DIAGNOSIS — K219 Gastro-esophageal reflux disease without esophagitis: Secondary | ICD-10-CM

## 2019-12-27 DIAGNOSIS — M940 Chondrocostal junction syndrome [Tietze]: Secondary | ICD-10-CM

## 2019-12-27 DIAGNOSIS — M75121 Complete rotator cuff tear or rupture of right shoulder, not specified as traumatic: Secondary | ICD-10-CM

## 2019-12-27 MED ORDER — MELOXICAM 7.5 MG PO TABS: 7.5000 mg | ORAL_TABLET | Freq: Every day | ORAL | 0 refills | Status: DC

## 2019-12-27 NOTE — Telephone Encounter (Signed)
Pt called wanting refill on Meloxicam- sent into [facility]

## 2020-01-03 DIAGNOSIS — M542 Cervicalgia: Secondary | ICD-10-CM | POA: Diagnosis not present

## 2020-01-07 ENCOUNTER — Ambulatory Visit: Payer: Medicare PPO | Admitting: Family Medicine

## 2020-01-07 ENCOUNTER — Encounter: Payer: Self-pay | Admitting: Family Medicine

## 2020-01-07 ENCOUNTER — Other Ambulatory Visit: Payer: Self-pay

## 2020-01-07 VITALS — BP 128/80 | HR 80 | Ht 62.0 in | Wt 191.0 lb

## 2020-01-07 DIAGNOSIS — R079 Chest pain, unspecified: Secondary | ICD-10-CM

## 2020-01-07 DIAGNOSIS — M542 Cervicalgia: Secondary | ICD-10-CM | POA: Diagnosis not present

## 2020-01-07 DIAGNOSIS — E119 Type 2 diabetes mellitus without complications: Secondary | ICD-10-CM

## 2020-01-07 DIAGNOSIS — M940 Chondrocostal junction syndrome [Tietze]: Secondary | ICD-10-CM | POA: Diagnosis not present

## 2020-01-07 DIAGNOSIS — I10 Essential (primary) hypertension: Secondary | ICD-10-CM | POA: Diagnosis not present

## 2020-01-07 MED ORDER — TRAMADOL HCL 50 MG PO TABS: 50.0000 mg | ORAL_TABLET | Freq: Three times a day (TID) | ORAL | 0 refills | Status: AC | PRN

## 2020-01-07 MED ORDER — PREDNISONE 10 MG PO TABS: 10.0000 mg | ORAL_TABLET | Freq: Every day | ORAL | 0 refills | Status: DC

## 2020-01-07 NOTE — Progress Notes (Signed)
Date:  01/07/2020   Name:  Linda Vazquez   DOB:  16-Sep-1946   MRN:  [id number]   Chief Complaint: chest wall pain (hurts to turn over in bed and to lay back in bed.- "reminds me of when I cracked my sternum years ago")  Chest Pain  This is a recurrent problem. The current episode started 1 to 4 weeks ago. The onset quality is gradual. The problem occurs constantly. The problem has been waxing and waning. The pain is present in the substernal region. The pain is at a severity of 8/10. The pain is moderate. The quality of the pain is described as sharp. The pain does not radiate. Associated symptoms include a cough. Pertinent negatives include no abdominal pain, back pain, claudication, diaphoresis, dizziness, exertional chest pressure, fever, headaches, hemoptysis, irregular heartbeat, leg pain, lower extremity edema, malaise/fatigue, nausea, near-syncope, numbness, orthopnea, palpitations, PND, shortness of breath, sputum production, syncope, vomiting or weakness. Cough relieved by: meloxicam/advil. The pain is aggravated by coughing (turning over in bed/with seat belt). She has tried NSAIDs for the symptoms. The treatment provided moderate relief. Risk factors: diabetes/hypertension.  Her past medical history is significant for hypertension.  Hypertension This is a chronic problem. The current episode started more than 1 year ago. The problem has been waxing and waning since onset. The problem is controlled. Associated symptoms include chest pain. Pertinent negatives include no headaches, malaise/fatigue, orthopnea, palpitations, PND or shortness of breath. Past treatments include diuretics.  Diabetes She presents for her follow-up diabetic visit. She has type 2 diabetes mellitus. Her disease course has been stable. There are no hypoglycemic associated symptoms. Pertinent negatives for hypoglycemia include no dizziness, headaches or nervousness/anxiousness. Associated symptoms include chest pain.  Pertinent negatives for diabetes include no fatigue, no polydipsia, no polyphagia, no polyuria and no weakness. Current diabetic treatment includes oral agent (monotherapy). She is compliant with treatment some of the time.    Lab Results  Component Value Date   CREATININE 1.03 (H) 10/16/2019   BUN 20 10/16/2019   NA 145 (H) 10/16/2019   K 4.7 10/16/2019   CL 105 10/16/2019   CO2 23 10/16/2019   Lab Results  Component Value Date   CHOL 152 10/16/2019   HDL 68 10/16/2019   LDLCALC 64 10/16/2019   TRIG 116 10/16/2019   CHOLHDL 3.0 01/17/2019   Lab Results  Component Value Date   TSH 2.650 10/16/2019   Lab Results  Component Value Date   HGBA1C 6.2 (H) 10/16/2019   No results found for: WBC, HGB, HCT, MCV, PLT Lab Results  Component Value Date   ALT 15 10/16/2019   AST 19 10/16/2019   ALKPHOS 105 10/16/2019   BILITOT 0.4 10/16/2019     Review of Systems  Constitutional: Negative.  Negative for chills, diaphoresis, fatigue, fever, malaise/fatigue and unexpected weight change.  HENT: Negative for congestion, ear discharge, ear pain, rhinorrhea, sinus pressure, sneezing and sore throat.   Eyes: Negative for photophobia, pain, discharge, redness and itching.  Respiratory: Positive for cough. Negative for hemoptysis, sputum production, shortness of breath, wheezing and stridor.   Cardiovascular: Positive for chest pain. Negative for palpitations, orthopnea, claudication, syncope, PND and near-syncope.  Gastrointestinal: Negative for abdominal pain, blood in stool, constipation, diarrhea, nausea and vomiting.  Endocrine: Negative for cold intolerance, heat intolerance, polydipsia, polyphagia and polyuria.  Genitourinary: Negative for dysuria, flank pain, frequency, hematuria, menstrual problem, pelvic pain, urgency, vaginal bleeding and vaginal discharge.  Musculoskeletal: Negative for arthralgias, back  pain and myalgias.  Skin: Negative for rash.  Allergic/Immunologic:  Negative for environmental allergies and food allergies.  Neurological: Negative for dizziness, weakness, light-headedness, numbness and headaches.  Hematological: Negative for adenopathy. Does not bruise/bleed easily.  Psychiatric/Behavioral: Negative for dysphoric mood. The patient is not nervous/anxious.     Patient Active Problem List   Diagnosis Date Noted  . Venous stasis dermatitis of left lower extremity 03/07/2019  . Venous insufficiency of both lower extremities 03/07/2019  . Obesity (BMI 35.0-39.9 without comorbidity) 03/07/2019  . Type 2 diabetes mellitus without complication, without long-term current use of insulin ([place]) 01/17/2019  . Nontraumatic complete tear of right rotator cuff 12/13/2018  . Rotator cuff tendinitis, right 12/13/2018  . Asthma, cough variant 05/29/2015  . 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 Chronic cough 10/31/2014  . Esophageal dysfunction 09/23/2014  . 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 BP (high blood pressure) 06/04/2013  . Dysphonia 03/01/2013  . Adult hypothyroidism 03/01/2013  . Malignant neoplasm of breast ([place]) 02/09/2013  . Acquired lymphedema 02/09/2013  . 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 Ace Inhibitors Cough    Per pt bp med caused cough possible ace inhibitor  . Sulfa Antibiotics Rash    Other Reaction: Not Assessed    Past Surgical History:  Procedure Laterality Date  . BIOPSY THYROID     benign  . BREAST LUMPECTOMY Right   . COLONOSCOPY  2006   repeat in 10 yrs- [facility] Dr  . KNEE ARTHROSCOPY WITH MENISCAL REPAIR Bilateral   . MASTECTOMY Left   . MASTECTOMY Right    2 yrs later  . MEDIASTINAL MASS EXCISION    . 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 TONSILLECTOMY    . tubal cauterization      Social History   Tobacco Use  . Smoking status: Never Smoker  . Smokeless tobacco: Never Used  . Tobacco comment: Smoking cessation materials not required   Substance Use Topics  . Alcohol use: Yes    Alcohol/week: 0.0 standard drinks    Comment: socially 1-2 x month  . Drug use: No     Medication list has been reviewed and updated.  Current Meds  Medication Sig  . ACCU-CHEK GUIDE test strip TEST EVERY DAY  . amitriptyline (ELAVIL) 10 MG tablet Take 1 tablet by mouth at bedtime as needed. Dr Rodolph Bong  . ammonium lactate (LAC-HYDRIN) 12 % lotion Apply 1 application topically 2 (two) times daily. Dr Nehemiah Massed  . aspirin EC 81 MG tablet Take 1 tablet by mouth daily.  Marland Kitchen atorvastatin (LIPITOR) 10 MG tablet Take 1 tablet (10 mg total) by mouth daily.  Marland Kitchen azelastine (ASTELIN) 0.1 % nasal spray Place 1 spray into the nose 2 (two) times daily. [facility]  . BD PEN NEEDLE NANO 2ND GEN 32G X 4 MM MISC ONE INJECTION DAILY  . benzonatate (TESSALON) 100 MG capsule TAKE 1 CAPSULE (100 MG TOTAL) BY MOUTH 2 (TWO) TIMES DAILY AS NEEDED  . carbamide peroxide (DEBROX) 6.5 % OTIC solution Place 5 drops into both ears 2 (two) times daily. (Patient taking differently: Place 5 drops into both ears 2 (two) times daily as needed. )  . cetirizine-pseudoephedrine (ZYRTEC-D) 5-120 MG tablet Take 1 tablet by mouth daily. OTC  . clonazePAM (KLONOPIN) 0.5 MG tablet Take 0.5 mg by mouth daily. Otolaryn. DR Patrice Paradise  . DULERA 100-5 MCG/ACT AERO   . hydrochlorothiazide (HYDRODIURIL) 25  MG tablet Take 1 tablet (25 mg total) by mouth daily.  Marland Kitchen levothyroxine (SYNTHROID) 50 MCG tablet Take 1 tablet (50 mcg total) by mouth daily.  Marland Kitchen liraglutide (VICTOZA) 18 MG/3ML SOPN Inject 0.1 mLs (0.6 mg total) into the skin daily.  . meloxicam (MOBIC) 7.5 MG tablet Take 1 tablet (7.5 mg total) by mouth daily. Once daily  . metFORMIN (GLUCOPHAGE-XR) 750 MG 24 hr tablet Take 1 tablet (750 mg total) by mouth at bedtime. TAKE 1 TABLET BY MOUTH EVERY DAY WITH supper  . montelukast (SINGULAIR) 10 MG tablet Take 1 tablet (10 mg total) by mouth at bedtime.  . Multiple Vitamin (MULTIVITAMIN WITH  MINERALS) TABS tablet Take 1 tablet by mouth daily.  Marland Kitchen omeprazole (PRILOSEC) 40 MG capsule TAKE 1 CAPSULE BY MOUTH EVERY DAY  . OneTouch Delica Lancets 99991111 MISC USE AS DIRECTED DAILY  . Probiotic Product (PROBIOTIC DAILY PO) Take 1 each by mouth daily. gummie  . triamcinolone cream (KENALOG) 0.1 % Apply 1 application topically 2 (two) times daily.  . Vitamin D, Cholecalciferol, 50 MCG (2000 UT) CAPS Take 1 capsule by mouth daily.    PHQ 2/9 Scores 10/16/2019 04/12/2019 03/07/2019 02/07/2019  PHQ - 2 Score 0 0 0 0  PHQ- 9 Score 0 0 0 -    BP Readings from Last 3 Encounters:  01/07/20 128/80  12/14/19 120/80  10/16/19 120/80    Physical Exam Vitals and nursing note reviewed.  Constitutional:      General: She is not in acute distress.    Appearance: She is not diaphoretic.  HENT:     Head: Normocephalic and atraumatic.     Right Ear: External ear normal.     Left Ear: External ear normal.     Nose: Nose normal.  Eyes:     General:        Right eye: No discharge.        Left eye: No discharge.     Conjunctiva/sclera: Conjunctivae normal.     Pupils: Pupils are equal, round, and reactive to light.  Neck:     Thyroid: No thyromegaly.     Vascular: No JVD.  Cardiovascular:     Rate and Rhythm: Normal rate and regular rhythm.     Heart sounds: Normal heart sounds, S1 normal and S2 normal. No murmur. No systolic murmur. No diastolic murmur. No friction rub. No gallop. No S3 or S4 sounds.   Pulmonary:     Effort: Pulmonary effort is normal.     Breath sounds: Normal breath sounds. No decreased breath sounds, wheezing, rhonchi or rales.  Chest:     Chest wall: Tenderness present.    Abdominal:     General: Bowel sounds are normal.     Palpations: Abdomen is soft. There is no mass.     Tenderness: There is no abdominal tenderness. There is no guarding.  Musculoskeletal:        General: Normal range of motion.     Cervical back: Normal range of motion and neck supple.     Right  lower leg: No edema.     Left lower leg: No edema.  Lymphadenopathy:     Cervical: No cervical adenopathy.  Skin:    General: Skin is warm and dry.  Neurological:     Mental Status: She is alert.     Deep Tendon Reflexes: Reflexes are normal and symmetric.     Wt Readings from Last 3 Encounters:  01/07/20 191 lb (86.6 kg)  12/14/19 190 lb (86.2 kg)  10/16/19 194 lb (88 kg)    BP 128/80   Pulse 80   Ht 5\' 2"  (1.575 m)   Wt 191 lb (86.6 kg)   BMI 34.93 kg/m   Assessment and Plan: 1. Chest pain at rest Persistent.  Acute.  Waxes and wanes in intensity.  Patient has continued to have costal sternal discomfort particularly with turning over in bed and coughing and turning with seatbelt on and car.  Pain is at rest and exacerbated by activity and is not associated with exertion such as climbing stairs.  EKG was repeated: EKG interpretation as follows rate 80 intervals normal there is no evidence of LVH by criteria on EKG.  There is no ischemic changes such as Q waves, R wave progression, or ST-T wave changes.  Although patient has some risk factors there is no suggestion that this is anginal in nature and we will proceed with the plan of prednisone tapering 40 mg to 30-20 to 10/2 weeks.  Patient was also encouraged to use Aleve instead of meloxicam for antiinflammation.  Patient was also prescribed tramadol to take at night primarily so that she can rest at night and not be interrupted by pain.  Patient is to return in 2 weeks for recheck. - EKG 12-Lead  2. Costochondritis Costochondritis as noted above EKG was done for chest pain and costochondritis is treated with prednisone, Aleve, and tramadol, to be reevaluated in 2 weeks.  3. Type 2 diabetes mellitus without complication, without long-term current use of insulin (HCC) Chronic.  Controlled.  Stable.  Continue Metformin 750 mg XR once a day and Victoza 0.6 mg daily..  Review of control with and good limitations.  4. Essential  hypertension Chronic.  Controlled.  Stable.  EKG today is noted to be 128/80.  Patient will continue hydrochlorothiazide 25 mg once a day.

## 2020-01-10 DIAGNOSIS — M542 Cervicalgia: Secondary | ICD-10-CM | POA: Diagnosis not present

## 2020-01-14 DIAGNOSIS — M542 Cervicalgia: Secondary | ICD-10-CM | POA: Diagnosis not present

## 2020-01-17 DIAGNOSIS — M542 Cervicalgia: Secondary | ICD-10-CM | POA: Diagnosis not present

## 2020-01-18 ENCOUNTER — Other Ambulatory Visit: Payer: Self-pay | Admitting: Family Medicine

## 2020-01-18 DIAGNOSIS — K219 Gastro-esophageal reflux disease without esophagitis: Secondary | ICD-10-CM

## 2020-01-21 ENCOUNTER — Ambulatory Visit: Payer: Medicare PPO | Admitting: Family Medicine

## 2020-01-21 ENCOUNTER — Other Ambulatory Visit: Payer: Self-pay

## 2020-01-21 ENCOUNTER — Encounter: Payer: Self-pay | Admitting: Family Medicine

## 2020-01-21 VITALS — BP 110/64 | HR 76 | Ht 62.0 in | Wt 192.0 lb

## 2020-01-21 DIAGNOSIS — M7581 Other shoulder lesions, right shoulder: Secondary | ICD-10-CM | POA: Diagnosis not present

## 2020-01-21 DIAGNOSIS — R0789 Other chest pain: Secondary | ICD-10-CM

## 2020-01-21 DIAGNOSIS — M542 Cervicalgia: Secondary | ICD-10-CM | POA: Diagnosis not present

## 2020-01-21 MED ORDER — TRAMADOL HCL 50 MG PO TABS: 50.0000 mg | ORAL_TABLET | Freq: Three times a day (TID) | ORAL | 0 refills | Status: AC | PRN

## 2020-01-21 NOTE — Progress Notes (Signed)
Date:  01/21/2020   Name:  Linda Vazquez   DOB:  10/31/1945   MRN:  [id number]   Chief Complaint: Follow-up [place] sent to PT- feeling better)  Chest Pain  This is a recurrent (for chest wall pain) problem. The current episode started more than 1 year ago. The onset quality is undetermined. The problem occurs daily. The problem has been rapidly worsening. The pain is present in the substernal region. The quality of the pain is described as dull. The pain does not radiate. Associated symptoms include a cough. Pertinent negatives include no abdominal pain, back pain, claudication, dizziness, exertional chest pressure, fever, headaches, hemoptysis, irregular heartbeat, leg pain, nausea, near-syncope, numbness, orthopnea, palpitations, PND, shortness of breath, vomiting or weakness.    Lab Results  Component Value Date   CREATININE 1.03 (H) 10/16/2019   BUN 20 10/16/2019   NA 145 (H) 10/16/2019   K 4.7 10/16/2019   CL 105 10/16/2019   CO2 23 10/16/2019   Lab Results  Component Value Date   CHOL 152 10/16/2019   HDL 68 10/16/2019   LDLCALC 64 10/16/2019   TRIG 116 10/16/2019   CHOLHDL 3.0 01/17/2019   Lab Results  Component Value Date   TSH 2.650 10/16/2019   Lab Results  Component Value Date   HGBA1C 6.2 (H) 10/16/2019   No results found for: WBC, HGB, HCT, MCV, PLT Lab Results  Component Value Date   ALT 15 10/16/2019   AST 19 10/16/2019   ALKPHOS 105 10/16/2019   BILITOT 0.4 10/16/2019     Review of Systems  Constitutional: Negative.  Negative for chills, fatigue, fever and unexpected weight change.  HENT: Negative for congestion, ear discharge, ear pain, rhinorrhea, sinus pressure, sneezing and sore throat.   Eyes: Negative for photophobia, pain, discharge, redness and itching.  Respiratory: Positive for cough. Negative for hemoptysis, shortness of breath, wheezing and stridor.   Cardiovascular: Positive for chest pain. Negative for palpitations, orthopnea,  claudication, PND and near-syncope.  Gastrointestinal: Negative for abdominal pain, blood in stool, constipation, diarrhea, nausea and vomiting.  Endocrine: Negative for cold intolerance, heat intolerance, polydipsia, polyphagia and polyuria.  Genitourinary: Negative for dysuria, flank pain, frequency, hematuria, menstrual problem, pelvic pain, urgency, vaginal bleeding and vaginal discharge.  Musculoskeletal: Negative for arthralgias, back pain and myalgias.  Skin: Negative for rash.  Allergic/Immunologic: Negative for environmental allergies and food allergies.  Neurological: Negative for dizziness, weakness, light-headedness, numbness and headaches.  Hematological: Negative for adenopathy. Does not bruise/bleed easily.  Psychiatric/Behavioral: Negative for dysphoric mood. The patient is not nervous/anxious.     Patient Active Problem List   Diagnosis Date Noted  . Venous stasis dermatitis of left lower extremity 03/07/2019  . Venous insufficiency of both lower extremities 03/07/2019  . Obesity (BMI 35.0-39.9 without comorbidity) 03/07/2019  . Type 2 diabetes mellitus without complication, without long-term current use of insulin ([place]) 01/17/2019  . Nontraumatic complete tear of right rotator cuff 12/13/2018  . Rotator cuff tendinitis, right 12/13/2018  . Asthma, cough variant 05/29/2015  . 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 Chronic cough 10/31/2014  . Esophageal dysfunction 09/23/2014  . 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 BP (high blood pressure) 06/04/2013  . Dysphonia 03/01/2013  . Adult hypothyroidism 03/01/2013  . Malignant neoplasm of breast ([place]) 02/09/2013  . Acquired lymphedema 02/09/2013  . 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 Ace Inhibitors Cough    Per pt bp med caused cough possible ace  inhibitor  . Sulfa Antibiotics Rash    Other Reaction: Not Assessed    Past Surgical History:  Procedure Laterality Date  . BIOPSY THYROID      benign  . BREAST LUMPECTOMY Right   . COLONOSCOPY  2006   repeat in 10 yrs- [facility] Dr  . KNEE ARTHROSCOPY WITH MENISCAL REPAIR Bilateral   . MASTECTOMY Left   . MASTECTOMY Right    2 yrs later  . MEDIASTINAL MASS EXCISION    . 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 TONSILLECTOMY    . tubal cauterization      Social History   Tobacco Use  . Smoking status: Never Smoker  . Smokeless tobacco: Never Used  . Tobacco comment: Smoking cessation materials not required  Substance Use Topics  . Alcohol use: Yes    Alcohol/week: 0.0 standard drinks    Comment: socially 1-2 x month  . Drug use: No     Medication list has been reviewed and updated.  Current Meds  Medication Sig  . ACCU-CHEK GUIDE test strip TEST EVERY DAY  . ammonium lactate (LAC-HYDRIN) 12 % lotion Apply 1 application topically 2 (two) times daily. Dr Nehemiah Massed  . aspirin EC 81 MG tablet Take 1 tablet by mouth daily.  Marland Kitchen atorvastatin (LIPITOR) 10 MG tablet Take 1 tablet (10 mg total) by mouth daily.  Marland Kitchen azelastine (ASTELIN) 0.1 % nasal spray Place 1 spray into the nose 2 (two) times daily. [facility]  . BD PEN NEEDLE NANO 2ND GEN 32G X 4 MM MISC ONE INJECTION DAILY  . benzonatate (TESSALON) 100 MG capsule TAKE 1 CAPSULE (100 MG TOTAL) BY MOUTH 2 (TWO) TIMES DAILY AS NEEDED  . carbamide peroxide (DEBROX) 6.5 % OTIC solution Place 5 drops into both ears 2 (two) times daily. (Patient taking differently: Place 5 drops into both ears 2 (two) times daily as needed. )  . cetirizine-pseudoephedrine (ZYRTEC-D) 5-120 MG tablet Take 1 tablet by mouth daily. OTC  . clonazePAM (KLONOPIN) 0.5 MG tablet Take 0.5 mg by mouth daily. Otolaryn. DR Patrice Paradise  . DULERA 100-5 MCG/ACT AERO   . hydrochlorothiazide (HYDRODIURIL) 25 MG tablet Take 1 tablet (25 mg total) by mouth daily.  Marland Kitchen levothyroxine (SYNTHROID) 50 MCG tablet Take 1 tablet (50 mcg total) by mouth daily.  Marland Kitchen liraglutide (VICTOZA) 18 MG/3ML SOPN Inject 0.1  mLs (0.6 mg total) into the skin daily.  . metFORMIN (GLUCOPHAGE-XR) 750 MG 24 hr tablet Take 1 tablet (750 mg total) by mouth at bedtime. TAKE 1 TABLET BY MOUTH EVERY DAY WITH supper  . montelukast (SINGULAIR) 10 MG tablet Take 1 tablet (10 mg total) by mouth at bedtime.  . Multiple Vitamin (MULTIVITAMIN WITH MINERALS) TABS tablet Take 1 tablet by mouth daily.  Marland Kitchen omeprazole (PRILOSEC) 40 MG capsule TAKE 1 CAPSULE BY MOUTH EVERY DAY  . OneTouch Delica Lancets 99991111 MISC USE AS DIRECTED DAILY  . predniSONE (DELTASONE) 10 MG tablet Take 1 tablet (10 mg total) by mouth daily with breakfast. Taper 4,4,4,4,3,3,3,3,2,2,2,2,1,1,1,1.  . Probiotic Product (PROBIOTIC DAILY PO) Take 1 each by mouth daily. gummie  . triamcinolone cream (KENALOG) 0.1 % Apply 1 application topically 2 (two) times daily.  . Vitamin D, Cholecalciferol, 50 MCG (2000 UT) CAPS Take 1 capsule by mouth daily.    PHQ 2/9 Scores 01/21/2020 10/16/2019 04/12/2019 03/07/2019  PHQ - 2 Score 0 0 0 0  PHQ- 9 Score 0 0 0 0  BP Readings from Last 3 Encounters:  01/21/20 110/64  01/07/20 128/80  12/14/19 120/80    Physical Exam Vitals and nursing note reviewed. Exam conducted with a chaperone present.  Constitutional:      Appearance: She is well-developed.  HENT:     Head: Normocephalic.     Right Ear: External ear normal.     Left Ear: External ear normal.     Nose: Nose normal.     Mouth/Throat:     Mouth: Mucous membranes are moist.  Eyes:     General: Lids are everted, no foreign bodies appreciated. No scleral icterus.       Left eye: No foreign body or hordeolum.     Conjunctiva/sclera: Conjunctivae normal.     Right eye: Right conjunctiva is not injected.     Left eye: Left conjunctiva is not injected.     Pupils: Pupils are equal, round, and reactive to light.  Neck:     Thyroid: No thyromegaly.     Vascular: No JVD.     Trachea: No tracheal deviation.  Cardiovascular:     Rate and Rhythm: Normal rate and regular  rhythm.     Heart sounds: Normal heart sounds. No murmur. No friction rub. No gallop.   Pulmonary:     Effort: Pulmonary effort is normal. No respiratory distress.     Breath sounds: Normal breath sounds. No wheezing, rhonchi or rales.  Chest:     Chest wall: Tenderness present.    Abdominal:     General: Bowel sounds are normal.     Palpations: Abdomen is soft. There is no mass.     Tenderness: There is no abdominal tenderness. There is no guarding or rebound.  Musculoskeletal:        General: No tenderness. Normal range of motion.     Cervical back: Normal range of motion and neck supple.  Lymphadenopathy:     Cervical: No cervical adenopathy.  Skin:    General: Skin is warm.     Findings: No rash.  Neurological:     Mental Status: She is alert and oriented to person, place, and time.     Cranial Nerves: No cranial nerve deficit.     Deep Tendon Reflexes: Reflexes normal.  Psychiatric:        Mood and Affect: Mood is not anxious or depressed.     Wt Readings from Last 3 Encounters:  01/21/20 192 lb (87.1 kg)  01/07/20 191 lb (86.6 kg)  12/14/19 190 lb (86.2 kg)    BP 110/64   Pulse 76   Ht 5\' 2"  (1.575 m)   Wt 192 lb (87.1 kg)   BMI 35.12 kg/m   Assessment and Plan: 1. Rotator cuff tendinitis, right Persistent.  New onset.  Followed by orthopedics.  Patient is still needing some relief at night and we will use tramadol on a as needed basis primarily no more than 8 hours as needed for the next 5 days. - traMADol (ULTRAM) 50 MG tablet; Take 1 tablet (50 mg total) by mouth every 8 (eight) hours as needed for up to 5 days.  Dispense: 15 tablet; Refill: 0  2. Chest wall tenderness Patient still has some chest wall tenderness which is associated with intercostal versus costochondritis circumstances.  Patient will continue physical therapy on an as-needed basis.

## 2020-01-22 DIAGNOSIS — E669 Obesity, unspecified: Secondary | ICD-10-CM | POA: Diagnosis not present

## 2020-01-22 DIAGNOSIS — M5412 Radiculopathy, cervical region: Secondary | ICD-10-CM | POA: Diagnosis not present

## 2020-01-24 ENCOUNTER — Other Ambulatory Visit: Payer: Self-pay | Admitting: Family Medicine

## 2020-01-24 DIAGNOSIS — E782 Mixed hyperlipidemia: Secondary | ICD-10-CM

## 2020-01-25 DIAGNOSIS — M542 Cervicalgia: Secondary | ICD-10-CM | POA: Diagnosis not present

## 2020-01-29 ENCOUNTER — Ambulatory Visit: Payer: Medicare PPO | Admitting: Dermatology

## 2020-01-29 ENCOUNTER — Other Ambulatory Visit: Payer: Self-pay

## 2020-01-29 DIAGNOSIS — D692 Other nonthrombocytopenic purpura: Secondary | ICD-10-CM

## 2020-01-29 DIAGNOSIS — L299 Pruritus, unspecified: Secondary | ICD-10-CM | POA: Diagnosis not present

## 2020-01-29 NOTE — Progress Notes (Signed)
   Follow-Up Visit   Subjective  Linda Vazquez is a 74 y.o. female who presents for the following: Rash (Itching of right shoulder. She never had a rash only itching that started around the same time that she was diagnosed with costochondritis and a pinch nerve.Dr Ronnald Ramp gave her [place] 0.1% cream which has helped with the itch.).    The following portions of the chart were reviewed this encounter and updated as appropriate:  Tobacco  Allergies  Meds  Problems  Med Hx  Surg Hx  Fam Hx      Review of Systems:  No other skin or systemic complaints except as noted in HPI or Assessment and Plan.  Objective  Well appearing patient in no apparent distress; mood and affect are within normal limits.  A focused examination was performed including arms. Relevant physical exam findings are noted in the Assessment and Plan.  Objective  Left Upper Arm - Posterior: Purpura   Assessment & Plan  Pruritus Right Shoulder - Anterior  Pruritus most likely secondary to pinched nerve.  Continue TMC 0.1% cream bid up to 5 days per week as needed.   Topical steroids (such as triamcinolone, fluocinolone, fluocinonide, mometasone, clobetasol, halobetasol, betamethasone, hydrocortisone) can cause thinning and lightening of the skin if they are used for too long in the same area. Your physician has selected the right strength medicine for your problem and area affected on the body. Please use your medication only as directed by your physician to prevent side effects.    Recommend Voltaren gel qd-bid (OTC).  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   I, Ashok Cordia, CMA, am acting as scribe for Sarina Ser, MD .  Documentation: I have reviewed the above documentation for accuracy and completeness, and I agree with the above.  Sarina Ser, MD

## 2020-02-11 ENCOUNTER — Encounter: Payer: Self-pay | Admitting: Dermatology

## 2020-02-11 ENCOUNTER — Ambulatory Visit: Payer: Medicare PPO

## 2020-02-11 ENCOUNTER — Other Ambulatory Visit: Payer: Self-pay | Admitting: Family Medicine

## 2020-02-11 DIAGNOSIS — K219 Gastro-esophageal reflux disease without esophagitis: Secondary | ICD-10-CM

## 2020-02-13 ENCOUNTER — Ambulatory Visit (INDEPENDENT_AMBULATORY_CARE_PROVIDER_SITE_OTHER): Payer: Medicare PPO

## 2020-02-13 DIAGNOSIS — Z0000 Encounter for general adult medical examination without abnormal findings: Secondary | ICD-10-CM | POA: Diagnosis not present

## 2020-02-13 NOTE — Progress Notes (Signed)
Subjective:   Linda Vazquez is a 74 y.o. female who presents for Medicare Annual (Subsequent) preventive examination.  Virtual Visit via Telephone Note  I connected with  Sharmon Revere on 02/13/20 at  8:00 AM EDT by telephone and verified that I am speaking with the correct person using two identifiers.  Medicare Annual Wellness visit completed telephonically due to Covid-19 pandemic.   Location: Patient: home Provider: office   I discussed the limitations, risks, security and privacy concerns of performing an evaluation and management service by telephone and the availability of in person appointments. The patient expressed understanding and agreed to proceed.  Unable to perform video visit due to patient does not have video capability.   Some vital signs may be absent or patient reported.   Clemetine Marker, LPN    Review of Systems:   Cardiac Risk Factors include: advanced age (>38mn, >>59women);diabetes mellitus;dyslipidemia;hypertension     Objective:     Vitals: There were no vitals taken for this visit.  There is no height or weight on file to calculate BMI.  Advanced Directives 02/13/2020 02/07/2019 12/18/2018 02/06/2018 11/06/2015  Does Patient Have a Medical Advance Directive? _0   Type of [place]amed[place]of [place] Living will;Healthcare Power of Attorney Living will;Healthcare Power of [place]  Does patient want to make changes to medical advance directive? - - No - Patient declined - -  Copy of HVirgilinain Chart? Yes - validated most recent copy scanned in chart (See row information) No - copy requested - No - copy requested No - copy requested    Tobacco Social History   Tobacco Use  Smoking Status Never Smoker  Smokeless Tobacco Never Used  Tobacco Comment   Smoking cessation materials not required       Counseling given: Not Answered Comment: Smoking cessation materials not required   Clinical Intake:  Pre-visit preparation completed: Yes  Pain : No/denies pain     Nutritional Risks: None Diabetes: Yes CBG done?: No Did pt. bring in CBG monitor from home?: No   Nutrition Risk Assessment:  Has the patient had any N/V/D within the last 2 months?  No  Does the patient have any non-healing wounds?  No  Has the patient had any unintentional weight loss or weight gain?  No   Diabetes:  Is the patient diabetic?  Yes  If diabetic, was a CBG obtained today?  No  Did the patient bring in their glucometer from home?  No  How often do you monitor your CBG's? Pt does not routinely check blood sugar.   Financial Strains and Diabetes Management:  Are you having any financial strains with the device, your supplies or your medication? No .  Does the patient want to be seen by Chronic Care Management for management of their diabetes?  No  Would the patient like to be referred to a Nutritionist or for Diabetic Management?  No   Diabetic Exams:  Diabetic Eye Exam: Completed 02/28/19.   Diabetic Foot Exam: Completed 01/17/19. Pt has been advised about the importance in completing this exam. Pt is scheduled for diabetic foot exam on 04/16/19.      How often do you need to have someone help you when you read instructions, pamphlets, or other written materials from your doctor or pharmacy?: 1 - Never  Interpreter Needed?: No  Information entered by ::  Clemetine Marker LPN  Past Medical History:  Diagnosis Date  . Allergy   . Asthma   . Breast cancer ([place])   . Cancer ([place])   . Diabetes mellitus without complication ([place])   . GERD (gastroesophageal reflux disease)   . Hypertension   . Melanoma ([place])   . 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 Thyroid disease   . Varix of lower extremity    Past Surgical History:  Procedure Laterality Date  . BIOPSY  THYROID     benign  . BREAST LUMPECTOMY Right   . COLONOSCOPY  2006   repeat in 10 yrs- [facility] Dr  . KNEE ARTHROSCOPY WITH MENISCAL REPAIR Bilateral   . MASTECTOMY Left   . MASTECTOMY Right    2 yrs later  . MEDIASTINAL MASS EXCISION    . 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 TONSILLECTOMY    . tubal cauterization     Family History  Problem Relation Age of Onset  . Hypertension Mother   . Heart disease Mother   . Heart disease Father   . Diabetes Brother   . Diabetes Paternal Uncle   . Diabetes Brother    Social History   Socioeconomic History  . Marital status: Married    Spouse name: Not on file  . Number of children: 0  . Years of education: Not on file  . Highest education level: Master's degree (e.g., MA, MS, MEng, MEd, MSW, MBA)  Occupational History  . Occupation: Retired  Tobacco Use  . Smoking status: Never Smoker  . Smokeless tobacco: Never Used  . Tobacco comment: Smoking cessation materials not required  Substance and Sexual Activity  . Alcohol use: Yes    Alcohol/week: 0.0 standard drinks    Comment: socially 1-2 x month  . Drug use: No  . Sexual activity: Not Currently  Other Topics Concern  . Not on file  Social History Narrative  . Not on file   Social Determinants of Health   Financial Resource Strain: Low Risk   . Difficulty of Paying Living Expenses: Not hard at all  Food Insecurity: No Food Insecurity  . Worried About Charity fundraiser in the Last Year: Never true  . Ran Out of Food in the Last Year: Never true  Transportation Needs: No Transportation Needs  . Lack of Transportation (Medical): No  . Lack of Transportation (Non-Medical): No  Physical Activity: Insufficiently Active  . Days of Exercise per Week: 4 days  . Minutes of Exercise per Session: 20 min  Stress: No Stress Concern Present  . Feeling of Stress : Not at all  Social Connections: Not Isolated  . Frequency of Communication with Friends and  Family: More than three times a week  . Frequency of Social Gatherings with Friends and Family: Three times a week  . Attends Religious Services: More than 4 times per year  . Active Member of Clubs or Organizations: Yes  . Attends Archivist Meetings: More than 4 times per year  . Marital Status: Married    Outpatient Encounter Medications as of 02/13/2020  Medication Sig  . ACCU-CHEK GUIDE test strip TEST EVERY DAY  . ammonium lactate (LAC-HYDRIN) 12 % lotion Apply 1 application topically 2 (two) times daily. Dr Nehemiah Massed  . aspirin EC 81 MG tablet Take 1 tablet by mouth daily.  Marland Kitchen atorvastatin (LIPITOR) 10 MG tablet TAKE 1 TABLET  BY MOUTH EVERY DAY  . azelastine (ASTELIN) 0.1 % nasal spray Place 1 spray into the nose 2 (two) times daily. [facility]  . BD PEN NEEDLE NANO 2ND GEN 32G X 4 MM MISC ONE INJECTION DAILY  . benzonatate (TESSALON) 100 MG capsule TAKE 1 CAPSULE (100 MG TOTAL) BY MOUTH 2 (TWO) TIMES DAILY AS NEEDED  . Blood Glucose Monitoring Suppl (ACCU-CHEK GUIDE ME) w/Device KIT   . carbamide peroxide (DEBROX) 6.5 % OTIC solution Place 5 drops into both ears 2 (two) times daily. (Patient taking differently: Place 5 drops into both ears 2 (two) times daily as needed. )  . cetirizine-pseudoephedrine (ZYRTEC-D) 5-120 MG tablet Take 1 tablet by mouth daily. OTC  . clonazePAM (KLONOPIN) 0.5 MG tablet Take 0.5 mg by mouth daily. [place]ol[place]. DR Patrice Paradise  . DULERA 100-5 MCG/ACT AERO   . hydrochlorothiazide (HYDRODIURIL) 25 MG tablet Take 1 tablet (25 mg total) by mouth daily.  Marland Kitchen levothyroxine (SYNTHROID) 50 MCG tablet Take 1 tablet (50 mcg total) by mouth daily.  Marland Kitchen liraglutide (VICTOZA) 18 MG/3ML SOPN Inject 0.1 mLs (0.6 mg total) into the skin daily.  . metFORMIN (GLUCOPHAGE-XR) 750 MG 24 hr tablet Take 1 tablet (750 mg total) by mouth at bedtime. TAKE 1 TABLET BY MOUTH EVERY DAY WITH supper  . montelukast (SINGULAIR) 10 MG tablet Take 1 tablet (10 mg total) by mouth at bedtime.  .  Multiple Vitamin (MULTIVITAMIN WITH MINERALS) TABS tablet Take 1 tablet by mouth daily.  Marland Kitchen omeprazole (PRILOSEC) 40 MG capsule TAKE 1 CAPSULE BY MOUTH EVERY DAY  . OneTouch Delica Lancets 56C MISC USE AS DIRECTED DAILY  . Probiotic Product (PROBIOTIC DAILY PO) Take 1 each by mouth daily. gummie  . traMADol (ULTRAM) 50 MG tablet Take by mouth.  . Vitamin D, Cholecalciferol, 50 MCG (2000 UT) CAPS Take 1 capsule by mouth daily.  Marland Kitchen amitriptyline (ELAVIL) 10 MG tablet Take 1 tablet by mouth at bedtime as needed. Dr Rodolph Bong  . [DISCONTINUED] predniSONE (DELTASONE) 10 MG tablet Take 1 tablet (10 mg total) by mouth daily with breakfast. Taper 4,4,4,4,3,3,3,3,2,2,2,2,1,1,1,1.  . [DISCONTINUED] triamcinolone cream (KENALOG) 0.1 % Apply 1 application topically 2 (two) times daily.   No facility-administered encounter medications on file as of 02/13/2020.    Activities of Daily Living In your present state of health, do you have any difficulty performing the following activities: 02/13/2020  Hearing? N  Comment declines hearing aids  Vision? N  Difficulty concentrating or making decisions? N  Walking or climbing stairs? N  Dressing or bathing? N  Doing errands, shopping? N  Preparing Food and eating ? N  Using the Toilet? N  In the past six months, have you accidently leaked urine? N  Do you have problems with loss of bowel control? N  Managing your Medications? N  Managing your Finances? N  Housekeeping or managing your Housekeeping? N  Some recent data might be hidden    Patient Care Team: Juline Patch, MD as PCP - General (Family Medicine) Ihor Dow, MD as Consulting Physician (Pulmonary Disease) Everlean Patterson, MD as Consulting Physician (Otolaryngology) Patrice Paradise Carlynn Spry, MD as Consulting Physician (Otolaryngology)    Assessment:   This is a routine wellness examination for [place].  Exercise Activities and Dietary recommendations Current Exercise Habits: Home  exercise routine, Type of exercise: walking, Time (Minutes): 20, Frequency (Times/Week): 4, Weekly Exercise (Minutes/Week): 80, Intensity: Mild, Exercise limited by: None identified  Goals    . DIET - INCREASE  WATER INTAKE     Recommend to drink at least 6-8 8oz glasses of water per day.    . Patient Stated     Pt would like to maintain A1c with recent diagnosis of Type 2 DM       Fall Risk Fall Risk  02/13/2020 01/21/2020 02/07/2019 02/01/2019 01/25/2019  Falls in the past year? 0 0 0 (No Data) 0  Comment - - - no falls since previous visit -  Number falls in past yr: 0 - 0 - -  Injury with Fall? 0 - 0 - -  Risk for fall due to : No Fall Risks - - - -  Risk for fall due to: Comment - - - - -  Follow up Falls prevention discussed Falls evaluation completed Falls prevention discussed - -   FALL RISK PREVENTION PERTAINING TO THE HOME:  Any stairs in or around the home? Yes  If so, are there any without handrails? Yes   Home free of loose throw rugs in walkways, pet beds, electrical cords, etc? Yes  Adequate lighting in your home to reduce risk of falls? Yes   ASSISTIVE DEVICES UTILIZED TO PREVENT FALLS:  Life alert? No  Use of a cane, walker or w/c? No  Grab bars in the bathroom? Yes  Shower chair or bench in shower? No  Elevated toilet seat or a handicapped toilet? No   DME ORDERS:  DME order needed?  No   TIMED UP AND GO:  Was the test performed? No . Telephonic visit  Education: Fall risk prevention has been discussed.  Intervention(s) required? No   DME/home health order needed?  No    Depression Screen PHQ 2/9 Scores 02/13/2020 01/21/2020 10/16/2019 04/12/2019  PHQ - 2 Score 0 0 0 0  PHQ- 9 Score - 0 0 0     Cognitive Function 6CIT deferred for 2021 AWV; pt has no memory issues     6CIT Screen 02/07/2019 02/06/2018  What Year? 0 points 0 points  What month? 0 points 0 points  What time? 0 points 0 points  Count back from 20 0 points 0 points  Months in reverse 0  points 0 points  Repeat phrase 0 points 0 points  Total Score 0 0    Immunization History  Administered Date(s) Administered  . Influenza, High Dose Seasonal PF 07/06/2017, 06/28/2018, 05/29/2019  . Influenza-Unspecified 05/21/2014, 06/24/2014, 06/03/2015, 05/25/2016, 07/06/2017, 05/22/2019  . Moderna SARS-COVID-2 Vaccination 10/05/2019, 11/02/2019  . Pneumococcal Conjugate-13 11/10/2016  . Pneumococcal Polysaccharide-23 01/16/2014  . Tdap 11/06/2015  . Zoster 03/09/2012    Qualifies for Shingles Vaccine? Yes  Zostavax completed 2013. Due for Shingrix. Education has been provided regarding the importance of this vaccine. Pt has been advised to call insurance company to determine out of pocket expense. Advised may also receive vaccine at local pharmacy or Health Dept. Verbalized acceptance and understanding.  Tdap: Up to date  Flu Vaccine: Up to date  Pneumococcal Vaccine: Up to date  Covid-19 Vaccine: Up to date  Screening Tests Health Maintenance  Topic Date Due  . FOOT EXAM  01/17/2020  . OPHTHALMOLOGY EXAM  02/28/2020  . HEMOGLOBIN A1C  04/14/2020  . INFLUENZA VACCINE  04/20/2020  . URINE MICROALBUMIN  10/15/2020  . TETANUS/TDAP  11/05/2025  . DEXA SCAN  Completed  . COVID-19 Vaccine  Completed  . Hepatitis C Screening  Completed  . PNA vac Low Risk Adult  Completed  . MAMMOGRAM  Discontinued    Cancer  Screenings:  Colorectal Screening: Completed 04/22/16. No longer required.   Mammogram:  No longer required. Double mastectomy.   Bone Density: Completed 11/11/15. Results reflect NORMAL. Repeat every 2 years. Pt declines repeat screening at this time.   Lung Cancer Screening: (Low Dose CT Chest recommended if Age 14-80 years, 30 pack-year currently smoking OR have quit w/in 15years.) does not qualify.    Additional Screening:  Hepatitis C Screening: does qualify; Completed 10/17/17  Vision Screening: Recommended annual ophthalmology exams for early detection of  glaucoma and other disorders of the eye. Is the patient up to date with their annual eye exam?  Yes  Who is the provider or what is the name of the office in which the pt attends annual eye exams? [facility]  Dental Screening: Recommended annual dental exams for proper oral hygiene  Community Resource Referral:  CRR required this visit?  No      Plan:     I have personally reviewed and addressed the Medicare Annual Wellness questionnaire and have noted the following in the patient's chart:  A. Medical and social history B. Use of alcohol, tobacco or illicit drugs  C. Current medications and supplements D. Functional ability and status E.  Nutritional status F.  Physical activity G. Advance directives H. List of other physicians I.  Hospitalizations, surgeries, and ER visits in previous 12 months J.  [place] such as hearing and vision if needed, cognitive and depression L. Referrals and appointments   In addition, I have reviewed and discussed with patient certain preventive protocols, quality metrics, and best practice recommendations. A written personalized care plan for preventive services as well as general preventive health recommendations were provided to patient.   Signed,  Clemetine Marker, LPN Nurse Health Advisor   Nurse Notes: none

## 2020-02-13 NOTE — Patient Instructions (Signed)
[name] , Thank you for taking time to come for your Medicare Wellness Visit. I appreciate your ongoing commitment to your health goals. Please review the following plan we discussed and let me know if I can assist you in the future.   Screening recommendations/referrals: Colonoscopy: done 04/22/16 Bone Density: done 11/11/15 Recommended yearly ophthalmology/optometry visit for glaucoma screening and checkup Recommended yearly dental visit for hygiene and checkup  Vaccinations: Influenza vaccine: done 05/22/19 Pneumococcal vaccine: done 11/10/16 Tdap vaccine: done 11/06/15 Shingles vaccine: Shingrix discussed. Please contact your pharmacy for coverage information.  Covid-19: done 10/05/19 7 11/02/19  Advanced directives: Please bring a copy of your health care power of attorney and living will to the office at your convenience.  Conditions/risks identified: Keep up the great work!  Next appointment: Please follow up in one year for your Medicare Annual Wellness visit.     Preventive Care 76 Years and Older, Female Preventive care refers to lifestyle choices and visits with your health care provider that can promote health and wellness. What does preventive care include?  A yearly physical exam. This is also called an annual well check.  Dental exams once or twice a year.  Routine eye exams. Ask your health care provider how often you should have your eyes checked.  Personal lifestyle choices, including:  Daily care of your teeth and gums.  Regular physical activity.  Eating a healthy diet.  Avoiding tobacco and drug use.  Limiting alcohol use.  Practicing safe sex.  Taking low-dose aspirin every day.  Taking vitamin and mineral supplements as recommended by your health care provider. What happens during an annual well check? The services and screenings done by your health care provider during your annual well check will depend on your age, overall health, lifestyle risk  factors, and family history of disease. Counseling  Your health care provider may ask you questions about your:  Alcohol use.  Tobacco use.  Drug use.  Emotional well-being.  Home and relationship well-being.  Sexual activity.  Eating habits.  History of falls.  Memory and ability to understand (cognition).  Work and work Statistician.  Reproductive health. Screening  You may have the following tests or measurements:  Height, weight, and BMI.  Blood pressure.  Lipid and cholesterol levels. These may be checked every 5 years, or more frequently if you are over 72 years old.  Skin check.  Lung cancer screening. You may have this screening every year starting at age 76 if you have a 30-pack-year history of smoking and currently smoke or have quit within the past 15 years.  Fecal occult blood test (FOBT) of the stool. You may have this test every year starting at age 76.  Flexible sigmoidoscopy or colonoscopy. You may have a sigmoidoscopy every 5 years or a colonoscopy every 10 years starting at age 76.  Hepatitis C blood test.  Hepatitis B blood test.  Sexually transmitted disease (STD) testing.  Diabetes screening. This is done by checking your blood sugar (glucose) after you have not eaten for a while (fasting). You may have this done every 1-3 years.  Bone density scan. This is done to screen for osteoporosis. You may have this done starting at age 76.  Mammogram. This may be done every 1-2 years. Talk to your health care provider about how often you should have regular mammograms. Talk with your health care provider about your test results, treatment options, and if necessary, the need for more tests. Vaccines  Your health  care provider may recommend certain vaccines, such as:  Influenza vaccine. This is recommended every year.  Tetanus, diphtheria, and acellular pertussis (Tdap, Td) vaccine. You may need a Td booster every 10 years.  Zoster vaccine. You  may need this after age 76.  Pneumococcal 13-valent conjugate (PCV13) vaccine. One dose is recommended after age 10.  Pneumococcal polysaccharide (PPSV23) vaccine. One dose is recommended after age 76. Talk to your health care provider about which screenings and vaccines you need and how often you need them. This information is not intended to replace advice given to you by your health care provider. Make sure you discuss any questions you have with your health care provider. Document Released: 10/03/2015 Document Revised: 05/26/2016 Document Reviewed: 07/08/2015 Elsevier Interactive Patient Education  2017 [place] Prevention in the Home Falls can cause injuries. They can happen to people of all ages. There are many things you can do to make your home safe and to help prevent falls. What can I do on the outside of my home?  Regularly fix the edges of walkways and driveways and fix any cracks.  Remove anything that might make you trip as you walk through a door, such as a raised step or threshold.  Trim any bushes or trees on the path to your home.  Use bright outdoor lighting.  Clear any walking paths of anything that might make someone trip, such as rocks or tools.  Regularly check to see if handrails are loose or broken. Make sure that both sides of any steps have handrails.  Any raised decks and porches should have guardrails on the edges.  Have any leaves, snow, or ice cleared regularly.  Use sand or salt on walking paths during winter.  Clean up any spills in your garage right away. This includes oil or grease spills. What can I do in the bathroom?  Use night lights.  Install grab bars by the toilet and in the tub and shower. Do not use towel bars as grab bars.  Use non-skid mats or decals in the tub or shower.  If you need to sit down in the shower, use a plastic, non-slip stool.  Keep the floor dry. Clean up any water that spills on the floor as soon as  it happens.  Remove soap buildup in the tub or shower regularly.  Attach bath mats securely with double-sided non-slip rug tape.  Do not have throw rugs and other things on the floor that can make you trip. What can I do in the bedroom?  Use night lights.  Make sure that you have a light by your bed that is easy to reach.  Do not use any sheets or blankets that are too big for your bed. They should not hang down onto the floor.  Have a firm chair that has side arms. You can use this for support while you get dressed.  Do not have throw rugs and other things on the floor that can make you trip. What can I do in the kitchen?  Clean up any spills right away.  Avoid walking on wet floors.  Keep items that you use a lot in easy-to-reach places.  If you need to reach something above you, use a strong step stool that has a grab bar.  Keep electrical cords out of the way.  Do not use floor polish or wax that makes floors slippery. If you must use wax, use non-skid floor wax.  Do not have  throw rugs and other things on the floor that can make you trip. What can I do with my stairs?  Do not leave any items on the stairs.  Make sure that there are handrails on both sides of the stairs and use them. Fix handrails that are broken or loose. Make sure that handrails are as long as the stairways.  Check any carpeting to make sure that it is firmly attached to the stairs. Fix any carpet that is loose or worn.  Avoid having throw rugs at the top or bottom of the stairs. If you do have throw rugs, attach them to the floor with carpet tape.  Make sure that you have a light switch at the top of the stairs and the bottom of the stairs. If you do not have them, ask someone to add them for you. What else can I do to help prevent falls?  Wear shoes that:  Do not have high heels.  Have rubber bottoms.  Are comfortable and fit you well.  Are closed at the toe. Do not wear sandals.  If  you use a stepladder:  Make sure that it is fully opened. Do not climb a closed stepladder.  Make sure that both sides of the stepladder are locked into place.  Ask someone to hold it for you, if possible.  Clearly mark and make sure that you can see:  Any grab bars or handrails.  First and last steps.  Where the edge of each step is.  Use tools that help you move around (mobility aids) if they are needed. These include:  Canes.  Walkers.  Scooters.  Crutches.  Turn on the lights when you go into a dark area. Replace any light bulbs as soon as they burn out.  Set up your furniture so you have a clear path. Avoid moving your furniture around.  If any of your floors are uneven, fix them.  If there are any pets around you, be aware of where they are.  Review your medicines with your doctor. Some medicines can make you feel dizzy. This can increase your chance of falling. Ask your doctor what other things that you can do to help prevent falls. This information is not intended to replace advice given to you by your health care provider. Make sure you discuss any questions you have with your health care provider. Document Released: 07/03/2009 Document Revised: 02/12/2016 Document Reviewed: 10/11/2014 Elsevier Interactive Patient Education  2017 [employer].

## 2020-02-14 DIAGNOSIS — L501 Idiopathic urticaria: Secondary | ICD-10-CM | POA: Diagnosis not present

## 2020-02-14 DIAGNOSIS — L309 Dermatitis, unspecified: Secondary | ICD-10-CM | POA: Diagnosis not present

## 2020-02-20 DIAGNOSIS — J385 Laryngeal spasm: Secondary | ICD-10-CM | POA: Diagnosis not present

## 2020-03-07 ENCOUNTER — Other Ambulatory Visit: Payer: Self-pay | Admitting: Family Medicine

## 2020-03-07 DIAGNOSIS — K219 Gastro-esophageal reflux disease without esophagitis: Secondary | ICD-10-CM

## 2020-04-08 ENCOUNTER — Other Ambulatory Visit: Payer: Self-pay | Admitting: Family Medicine

## 2020-04-10 DIAGNOSIS — J45991 Cough variant asthma: Secondary | ICD-10-CM | POA: Diagnosis not present

## 2020-04-10 DIAGNOSIS — K219 Gastro-esophageal reflux disease without esophagitis: Secondary | ICD-10-CM | POA: Diagnosis not present

## 2020-04-13 ENCOUNTER — Other Ambulatory Visit: Payer: Self-pay | Admitting: Family Medicine

## 2020-04-13 DIAGNOSIS — E119 Type 2 diabetes mellitus without complications: Secondary | ICD-10-CM

## 2020-04-13 NOTE — Telephone Encounter (Signed)
Requested Prescriptions  Pending Prescriptions Disp Refills  . metFORMIN (GLUCOPHAGE-XR) 750 MG 24 hr tablet [Pharmacy Med Name: METFORMIN HCL ER 750 MG TABLET] 90 tablet 0    Sig: TAKE 1 TABLET (750 MG TOTAL) BY MOUTH AT BEDTIME. TAKE 1 TABLET BY MOUTH EVERY DAY WITH SUPPER     Endocrinology:  Diabetes - Biguanides Failed - 04/13/2020  9:21 AM      Failed - Cr in normal range and within 360 days    Creatinine, Ser  Date Value Ref Range Status  10/16/2019 1.03 (H) 0.57 - 1.00 mg/dL Final         Passed - HBA1C is between 0 and 7.9 and within 180 days    Hgb A1c MFr Bld  Date Value Ref Range Status  10/16/2019 6.2 (H) 4.8 - 5.6 % Final    Comment:             Prediabetes: 5.7 - 6.4          Diabetes: >6.4          Glycemic control for adults with diabetes: <7.0          Passed - eGFR in normal range and within 360 days    GFR calc Af Amer  Date Value Ref Range Status  10/16/2019 61 >59 mL/min/1.73 Final   GFR calc non Af Amer  Date Value Ref Range Status  10/16/2019 53 (L) >59 mL/min/1.73 Final         Passed - Valid encounter within last 6 months    Recent Outpatient Visits          2 months ago Rotator cuff tendinitis, right   [facility] Juline Patch, MD   3 months ago Chest pain at rest   [facility] Juline Patch, MD   4 months ago Chest pain at rest   [place], [place]   6 months ago Type 2 diabetes mellitus without complication, without long-term current use of insulin ([place])   [facility] Juline Patch, MD   1 year ago New onset type 2 diabetes mellitus [facility])   [place], Deanna C, MD      Future Appointments            In 2 days Juline Patch, MD [facility], [facility]

## 2020-04-15 ENCOUNTER — Encounter: Payer: Self-pay | Admitting: Family Medicine

## 2020-04-15 ENCOUNTER — Other Ambulatory Visit: Payer: Self-pay

## 2020-04-15 ENCOUNTER — Ambulatory Visit: Payer: Medicare PPO | Admitting: Family Medicine

## 2020-04-15 VITALS — BP 138/80 | HR 80 | Ht 62.0 in | Wt 187.0 lb

## 2020-04-15 DIAGNOSIS — E039 Hypothyroidism, unspecified: Secondary | ICD-10-CM

## 2020-04-15 DIAGNOSIS — I10 Essential (primary) hypertension: Secondary | ICD-10-CM

## 2020-04-15 DIAGNOSIS — J45991 Cough variant asthma: Secondary | ICD-10-CM

## 2020-04-15 DIAGNOSIS — J301 Allergic rhinitis due to pollen: Secondary | ICD-10-CM | POA: Diagnosis not present

## 2020-04-15 DIAGNOSIS — K219 Gastro-esophageal reflux disease without esophagitis: Secondary | ICD-10-CM

## 2020-04-15 DIAGNOSIS — E119 Type 2 diabetes mellitus without complications: Secondary | ICD-10-CM

## 2020-04-15 DIAGNOSIS — E782 Mixed hyperlipidemia: Secondary | ICD-10-CM | POA: Diagnosis not present

## 2020-04-15 MED ORDER — OMEPRAZOLE 40 MG PO CPDR: DELAYED_RELEASE_CAPSULE | ORAL | 1 refills | Status: DC

## 2020-04-15 MED ORDER — ACCU-CHEK SOFTCLIX LANCETS MISC: 100.0000 | Freq: Every day | 1 refills | Status: DC

## 2020-04-15 MED ORDER — METFORMIN HCL ER 750 MG PO TB24: 750.0000 mg | ORAL_TABLET | Freq: Every day | ORAL | 1 refills | Status: DC

## 2020-04-15 MED ORDER — ACCU-CHEK GUIDE VI STRP: ORAL_STRIP | 1 refills | Status: DC

## 2020-04-15 MED ORDER — HYDROCHLOROTHIAZIDE 25 MG PO TABS: 25.0000 mg | ORAL_TABLET | Freq: Every day | ORAL | 1 refills | Status: DC

## 2020-04-15 MED ORDER — MONTELUKAST SODIUM 10 MG PO TABS: 10.0000 mg | ORAL_TABLET | Freq: Every day | ORAL | 1 refills | Status: DC

## 2020-04-15 MED ORDER — LEVOTHYROXINE SODIUM 50 MCG PO TABS: 50.0000 ug | ORAL_TABLET | Freq: Every day | ORAL | 1 refills | Status: DC

## 2020-04-15 MED ORDER — VICTOZA 18 MG/3ML ~~LOC~~ SOPN: 0.6000 mg | PEN_INJECTOR | Freq: Every day | SUBCUTANEOUS | 1 refills | Status: DC

## 2020-04-15 NOTE — Progress Notes (Signed)
Date:  04/15/2020   Name:  Linda Vazquez   DOB:  [birth year]   MRN:  [id number]   Chief Complaint: Diabetes, Hypothyroidism, Asthma, Gastroesophageal Reflux, Hypertension, and Hyperlipidemia  Diabetes She presents for her follow-up diabetic visit. She has type 2 diabetes mellitus. Her disease course has been stable. There are no hypoglycemic associated symptoms. Pertinent negatives for hypoglycemia include no dizziness, headaches, nervousness/anxiousness, sweats or tremors. Pertinent negatives for diabetes include no blurred vision, no chest pain, no fatigue, no polydipsia, no polyphagia, no polyuria, no visual change, no weakness and no weight loss. There are no hypoglycemic complications. Symptoms are stable. There are no diabetic complications. Risk factors for coronary artery disease include diabetes mellitus, dyslipidemia and hypertension. Current diabetic treatment includes oral agent (monotherapy) and diet (victoza/metformen). She is compliant with treatment all of the time. She is following a generally healthy diet. Meal planning includes avoidance of concentrated sweets. She participates in exercise intermittently. Her home blood glucose trend is decreasing steadily. Her breakfast blood glucose is taken between 8-9 am. Her breakfast blood glucose range is generally 110-130 mg/dl. An ACE inhibitor/angiotensin II receptor blocker is being taken. She does not see a podiatrist.Eye exam is not current.  Asthma There is no chest tightness, cough, difficulty breathing, frequent throat clearing, hemoptysis, hoarse voice, shortness of breath, sputum production or wheezing. This is a chronic problem. The current episode started more than 1 year ago. The problem has been gradually improving. Pertinent negatives include no appetite change, chest pain, dyspnea on exertion, ear congestion, ear pain, fever, headaches, heartburn, malaise/fatigue, myalgias, nasal congestion, orthopnea, PND, postnasal drip,  rhinorrhea, sneezing, sore throat, sweats, trouble swallowing or weight loss. Her symptoms are alleviated by beta-agonist, leukotriene antagonist and steroid inhaler. She reports moderate improvement on treatment. Her past medical history is significant for asthma. There is no history of bronchiectasis, bronchitis, COPD, emphysema or pneumonia.  Gastroesophageal Reflux She reports no abdominal pain, no belching, no chest pain, no choking, no coughing, no dysphagia, no early satiety, no globus sensation, no heartburn, no hoarse voice, no nausea, no sore throat, no stridor, no tooth decay, no water brash or no wheezing. This is a recurrent problem. The current episode started more than 1 year ago. The problem has been gradually improving. The symptoms are aggravated by certain foods. Pertinent negatives include no fatigue, orthopnea or weight loss. She has tried a PPI for the symptoms. The treatment provided moderate relief.  Hypertension This is a chronic problem. The current episode started more than 1 year ago. The problem has been gradually improving since onset. The problem is controlled. Pertinent negatives include no anxiety, blurred vision, chest pain, headaches, malaise/fatigue, neck pain, orthopnea, palpitations, peripheral edema, PND, shortness of breath or sweats. There are no associated agents to hypertension. There are no known risk factors for coronary artery disease. Past treatments include diuretics. The current treatment provides moderate improvement. Identifiable causes of hypertension include a thyroid problem. There is no history of chronic renal disease, a hypertension causing med or renovascular disease.  Hyperlipidemia This is a chronic problem. The current episode started more than 1 year ago. The problem is controlled. Recent lipid tests were reviewed and are normal. Exacerbating diseases include diabetes, hypothyroidism and obesity. She has no history of chronic renal disease, liver  disease or nephrotic syndrome. Pertinent negatives include no chest pain, myalgias or shortness of breath. Current antihyperlipidemic treatment includes statins. The current treatment provides no improvement of lipids. There are no compliance problems.  Thyroid Problem Presents for follow-up visit. Symptoms include dry skin. Patient reports no anxiety, cold intolerance, constipation, depressed mood, diaphoresis, diarrhea, fatigue, hair loss, heat intolerance, hoarse voice, leg swelling, menstrual problem, nail problem, palpitations, tremors, visual change, weight gain or weight loss. The symptoms have been stable. Her past medical history is significant for diabetes and hyperlipidemia.    Lab Results  Component Value Date   CREATININE 1.03 (H) 10/16/2019   BUN 20 10/16/2019   NA 145 (H) 10/16/2019   K 4.7 10/16/2019   CL 105 10/16/2019   CO2 23 10/16/2019   Lab Results  Component Value Date   CHOL 152 10/16/2019   HDL 68 10/16/2019   LDLCALC 64 10/16/2019   TRIG 116 10/16/2019   CHOLHDL 3.0 01/17/2019   Lab Results  Component Value Date   TSH 2.650 10/16/2019   Lab Results  Component Value Date   HGBA1C 6.2 (H) 10/16/2019   No results found for: WBC, HGB, HCT, MCV, PLT Lab Results  Component Value Date   ALT 15 10/16/2019   AST 19 10/16/2019   ALKPHOS 105 10/16/2019   BILITOT 0.4 10/16/2019     Review of Systems  Constitutional: Negative.  Negative for appetite change, chills, diaphoresis, fatigue, fever, malaise/fatigue, unexpected weight change, weight gain and weight loss.  HENT: Negative for congestion, ear discharge, ear pain, hoarse voice, postnasal drip, rhinorrhea, sinus pressure, sneezing, sore throat and trouble swallowing.   Eyes: Negative for blurred vision, photophobia, pain, discharge, redness and itching.  Respiratory: Negative for cough, hemoptysis, sputum production, choking, shortness of breath, wheezing and stridor.   Cardiovascular: Negative for chest  pain, dyspnea on exertion, palpitations, orthopnea and PND.  Gastrointestinal: Negative for abdominal pain, blood in stool, constipation, diarrhea, dysphagia, heartburn, nausea and vomiting.  Endocrine: Negative for cold intolerance, heat intolerance, polydipsia, polyphagia and polyuria.  Genitourinary: Negative for dysuria, flank pain, frequency, hematuria, menstrual problem, pelvic pain, urgency, vaginal bleeding and vaginal discharge.  Musculoskeletal: Negative for arthralgias, back pain, myalgias and neck pain.  Skin: Negative for rash.  Allergic/Immunologic: Negative for environmental allergies and food allergies.  Neurological: Negative for dizziness, tremors, weakness, light-headedness, numbness and headaches.  Hematological: Negative for adenopathy. Does not bruise/bleed easily.  Psychiatric/Behavioral: Negative for dysphoric mood. The patient is not nervous/anxious.     Patient Active Problem List   Diagnosis Date Noted  . Venous stasis dermatitis of left lower extremity 03/07/2019  . Venous insufficiency of both lower extremities 03/07/2019  . Obesity (BMI 35.0-39.9 without comorbidity) 03/07/2019  . Type 2 diabetes mellitus without complication, without long-term current use of insulin ([place]) 01/17/2019  . Nontraumatic complete tear of right rotator cuff 12/13/2018  . Rotator cuff tendinitis, right 12/13/2018  . Genetic testing 08/29/2017  . Asthma, cough variant 05/29/2015  . 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 Chronic cough 10/31/2014  . Esophageal dysfunction 09/23/2014  . 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 BP (high blood pressure) 06/04/2013  . Dysphonia 03/01/2013  . Adult hypothyroidism 03/01/2013  . Post-tussive emesis 03/01/2013  . Malignant neoplasm of breast ([place]) 02/09/2013  . Acquired lymphedema 02/09/2013  . 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 Ace Inhibitors Cough    Per pt bp med caused cough possible ace inhibitor  .  Sulfa Antibiotics Rash    Other Reaction: Not Assessed    Past Surgical History:  Procedure Laterality Date  . BIOPSY THYROID     benign  . BREAST LUMPECTOMY Right   .  COLONOSCOPY  2006   repeat in 10 yrs- [facility] Dr  . KNEE ARTHROSCOPY WITH MENISCAL REPAIR Bilateral   . MASTECTOMY Left   . MASTECTOMY Right    2 yrs later  . MEDIASTINAL MASS EXCISION    . 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 TONSILLECTOMY    . tubal cauterization      Social History   Tobacco Use  . Smoking status: Never Smoker  . Smokeless tobacco: Never Used  . Tobacco comment: Smoking cessation materials not required  Vaping Use  . Vaping Use: Never used  Substance Use Topics  . Alcohol use: Yes    Alcohol/week: 0.0 standard drinks    Comment: socially 1-2 x month  . Drug use: No     Medication list has been reviewed and updated.  Current Meds  Medication Sig  . ACCU-CHEK GUIDE test strip TEST EVERY DAY (Patient taking differently: SoftClix/ Dx E11.9)  . Accu-Chek Softclix Lancets lancets 100 each by Other route daily. Use as instructed/ Dx E11.9  . amitriptyline (ELAVIL) 10 MG tablet Take 1 tablet by mouth at bedtime as needed. Dr Rodolph Bong  . ammonium lactate (LAC-HYDRIN) 12 % lotion Apply 1 application topically 2 (two) times daily. Dr Nehemiah Massed  . aspirin EC 81 MG tablet Take 1 tablet by mouth daily.  Marland Kitchen atorvastatin (LIPITOR) 10 MG tablet TAKE 1 TABLET BY MOUTH EVERY DAY  . azelastine (ASTELIN) 0.1 % nasal spray Place 1 spray into the nose 2 (two) times daily. [facility]  . BD PEN NEEDLE NANO 2ND GEN 32G X 4 MM MISC ONE INJECTION DAILY  . benzonatate (TESSALON) 100 MG capsule TAKE 1 CAPSULE (100 MG TOTAL) BY MOUTH 2 (TWO) TIMES DAILY AS NEEDED  . carbamide peroxide (DEBROX) 6.5 % OTIC solution Place 5 drops into both ears 2 (two) times daily. (Patient taking differently: Place 5 drops into both ears 2 (two) times daily as needed. )  . cetirizine-pseudoephedrine  (ZYRTEC-D) 5-120 MG tablet Take 1 tablet by mouth daily. OTC  . clonazePAM (KLONOPIN) 0.5 MG tablet Take 0.5 mg by mouth daily. Otolaryn. DR Patrice Paradise  . hydrochlorothiazide (HYDRODIURIL) 25 MG tablet Take 1 tablet (25 mg total) by mouth daily.  Marland Kitchen levothyroxine (SYNTHROID) 50 MCG tablet Take 1 tablet (50 mcg total) by mouth daily.  Marland Kitchen liraglutide (VICTOZA) 18 MG/3ML SOPN Inject 0.1 mLs (0.6 mg total) into the skin daily.  . metFORMIN (GLUCOPHAGE-XR) 750 MG 24 hr tablet TAKE 1 TABLET (750 MG TOTAL) BY MOUTH AT BEDTIME. TAKE 1 TABLET BY MOUTH EVERY DAY WITH SUPPER  . montelukast (SINGULAIR) 10 MG tablet Take 1 tablet (10 mg total) by mouth at bedtime.  . Multiple Vitamin (MULTIVITAMIN WITH MINERALS) TABS tablet Take 1 tablet by mouth daily.  Marland Kitchen omeprazole (PRILOSEC) 40 MG capsule TAKE 1 CAPSULE BY MOUTH EVERY DAY  . Probiotic Product (PROBIOTIC DAILY PO) Take 1 each by mouth daily. gummie  . SYMBICORT 160-4.5 MCG/ACT inhaler pulmonology  . Vitamin D, Cholecalciferol, 50 MCG (2000 UT) CAPS Take 1 capsule by mouth daily.  . [DISCONTINUED] Accu-Chek FastClix Lancets MISC TEST DAILY    PHQ 2/9 Scores 04/15/2020 02/13/2020 01/21/2020 10/16/2019  PHQ - 2 Score 0 0 0 0  PHQ- 9 Score 0 - 0 0    GAD 7 : Generalized Anxiety Score 04/15/2020 01/21/2020 10/16/2019  Nervous, Anxious, on Edge 0 0 0  Control/stop worrying 0 0 0  Worry too much - different  things 0 0 0  Trouble relaxing 0 0 0  Restless 0 0 0  Easily annoyed or irritable 0 0 0  Afraid - awful might happen 0 0 0  Total GAD 7 Score 0 0 0    BP Readings from Last 3 Encounters:  04/15/20 (!) 138/80  01/21/20 110/64  01/07/20 128/80    Physical Exam Vitals and nursing note reviewed.  Constitutional:      General: She is not in acute distress.    Appearance: She is not diaphoretic.  HENT:     Head: Normocephalic and atraumatic.     Right Ear: External ear normal.     Left Ear: External ear normal.     Nose: Nose normal. No congestion or  rhinorrhea.     Mouth/Throat:     Mouth: Mucous membranes are moist.  Eyes:     General:        Right eye: No discharge.        Left eye: No discharge.     Conjunctiva/sclera: Conjunctivae normal.     Pupils: Pupils are equal, round, and reactive to light.  Neck:     Thyroid: No thyromegaly.     Vascular: No JVD.  Cardiovascular:     Rate and Rhythm: Normal rate and regular rhythm.     Pulses:          Dorsalis pedis pulses are 2+ on the right side and 2+ on the left side.       Posterior tibial pulses are 2+ on the right side and 2+ on the left side.     Heart sounds: Normal heart sounds. No murmur heard.  No friction rub. No gallop.   Pulmonary:     Effort: Pulmonary effort is normal.     Breath sounds: Normal breath sounds. No wheezing or rales.  Abdominal:     General: Bowel sounds are normal.     Palpations: Abdomen is soft. There is no mass.     Tenderness: There is no abdominal tenderness. There is no guarding.  Musculoskeletal:        General: Normal range of motion.     Cervical back: Normal range of motion and neck supple.     Right foot: Normal range of motion. No deformity or bunion.     Left foot: Normal range of motion. No deformity or bunion.  Feet:     Right foot:     Protective Sensation: 10 sites tested. 10 sites sensed.     Skin integrity: Dry skin present. No ulcer, blister, skin breakdown, erythema, warmth, callus or fissure.     Toenail Condition: Right toenails are normal.     Left foot:     Protective Sensation: 10 sites tested. 10 sites sensed.     Skin integrity: Dry skin present. No ulcer, blister, skin breakdown, erythema, warmth, callus or fissure.     Toenail Condition: Left toenails are normal.  Lymphadenopathy:     Head:     Right side of head: No tonsillar adenopathy.     Left side of head: No tonsillar adenopathy.     Cervical: No cervical adenopathy.     Right cervical: No superficial, deep or posterior cervical adenopathy.    Left  cervical: No superficial or deep cervical adenopathy.     Lower Body: No right inguinal adenopathy. No left inguinal adenopathy.  Skin:    General: Skin is warm and dry.     Capillary Refill: Capillary refill  takes less than 2 seconds.     Findings: No bruising or erythema.  Neurological:     General: No focal deficit present.     Mental Status: She is alert.     Cranial Nerves: No cranial nerve deficit.     Sensory: No sensory deficit.     Deep Tendon Reflexes: Reflexes are normal and symmetric.  Psychiatric:        Mood and Affect: Mood normal.     Wt Readings from Last 3 Encounters:  04/15/20 187 lb (84.8 kg)  01/21/20 192 lb (87.1 kg)  01/07/20 191 lb (86.6 kg)    BP (!) 138/80   Pulse 80   Ht 5\' 2"  (1.575 m)   Wt 187 lb (84.8 kg)   BMI 34.20 kg/m   Assessment and Plan:  1. Type 2 diabetes mellitus without complication, without long-term current use of insulin (HCC) Chronic.  Controlled.  Stable.  Continue Victoza 0.6 mg daily as well as Metformin 750 mg nightly.  Will check A1c. - glucose blood (ACCU-CHEK GUIDE) test strip; SoftClix/ Dx E11.9  Dispense: 100 strip; Refill: 1 - liraglutide (VICTOZA) 18 MG/3ML SOPN; Inject 0.1 mLs (0.6 mg total) into the skin daily.  Dispense: 3 pen; Refill: 1 - metFORMIN (GLUCOPHAGE-XR) 750 MG 24 hr tablet; Take 1 tablet (750 mg total) by mouth at bedtime. TAKE 1 TABLET BY MOUTH EVERY DAY WITH supper  Dispense: 90 tablet; Refill: 1 - HgB A1c - Renal Function Panel  2. Essential hypertension Chronic.  Controlled.  Stable.  Uncomplicated.  Continue hydrochlorothiazide 25 mg once a day.  Will check renal function panel. - hydrochlorothiazide (HYDRODIURIL) 25 MG tablet; Take 1 tablet (25 mg total) by mouth daily.  Dispense: 90 tablet; Refill: 1 - Renal Function Panel  3. Adult hypothyroidism Chronic.  Controlled.  Stable.  Will check thyroid panel with TSH and likely will be continue levothyroxine 50 mcg daily. - levothyroxine  (SYNTHROID) 50 MCG tablet; Take 1 tablet (50 mcg total) by mouth daily.  Dispense: 90 tablet; Refill: 1 - Thyroid Panel With TSH  4. 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  Controlled.  Stable.  Continue Singulair 10 mg once a day. - montelukast (SINGULAIR) 10 MG tablet; Take 1 tablet (10 mg total) by mouth at bedtime.  Dispense: 90 tablet; Refill: 1  5. Asthma, cough variant Chronic.  Controlled.  Stable.  Continue Singulair 10 mg once a day. - montelukast (SINGULAIR) 10 MG tablet; Take 1 tablet (10 mg total) by mouth at bedtime.  Dispense: 90 tablet; Refill: 1  6. Gastroesophageal reflux disease Chronic.  Controlled.  Stable.  Uncomplicated with no dysphagia.  We will continue omeprazole 40 mg once a day. - omeprazole (PRILOSEC) 40 MG capsule; TAKE 1 CAPSULE BY MOUTH EVERY DAY  Dispense: 90 capsule; Refill: 1  7. Mixed hyperlipidemia Chronic.  Controlled.  Stable.  Patient controlling with dietary approach.  We will continue with low-cholesterol surveillance.  Will check lipid panel for current level of control. - Lipid Panel With LDL/HDL Ratio

## 2020-04-16 LAB — RENAL FUNCTION PANEL
Albumin: 4.3 g/dL (ref 3.7–4.7)
BUN/Creatinine Ratio: 26 (ref 12–28)
BUN: 30 mg/dL — ABNORMAL HIGH (ref 8–27)
CO2: 25 mmol/L (ref 20–29)
Calcium: 9.4 mg/dL (ref 8.7–10.3)
Chloride: 100 mmol/L (ref 96–106)
Creatinine, Ser: 1.17 mg/dL — ABNORMAL HIGH (ref 0.57–1.00)
GFR calc Af Amer: 52 mL/min/{1.73_m2} — ABNORMAL LOW (ref >59)
GFR calc non Af Amer: 45 mL/min/{1.73_m2} — ABNORMAL LOW (ref >59)
Glucose: 106 mg/dL — ABNORMAL HIGH (ref 65–99)
Phosphorus: 3.6 mg/dL (ref 3.0–4.3)
Potassium: 3.8 mmol/L (ref 3.5–5.2)
Sodium: 144 mmol/L (ref 134–144)

## 2020-04-16 LAB — LIPID PANEL WITH LDL/HDL RATIO
Cholesterol, Total: 159 mg/dL (ref 100–199)
HDL: 65 mg/dL (ref >39)
LDL Chol Calc (NIH): 75 mg/dL (ref 0–99)
LDL/HDL Ratio: 1.2 ratio (ref 0.0–3.2)
Triglycerides: 103 mg/dL (ref 0–149)
VLDL Cholesterol Cal: 19 mg/dL (ref 5–40)

## 2020-04-16 LAB — THYROID PANEL WITH TSH
Free Thyroxine Index: 2.9 (ref 1.2–4.9)
T3 Uptake Ratio: 29 % (ref 24–39)
T4, Total: 10.1 ug/dL (ref 4.5–12.0)
TSH: 2.17 u[IU]/mL (ref 0.450–4.500)

## 2020-04-16 LAB — HEMOGLOBIN A1C
Est. average glucose Bld gHb Est-mCnc: 154 mg/dL
Hgb A1c MFr Bld: 7 % — ABNORMAL HIGH (ref 4.8–5.6)

## 2020-04-30 DIAGNOSIS — E119 Type 2 diabetes mellitus without complications: Secondary | ICD-10-CM | POA: Diagnosis not present

## 2020-04-30 LAB — HM DIABETES EYE EXAM

## 2020-05-01 DIAGNOSIS — C50111 Malignant neoplasm of central portion of right female breast: Secondary | ICD-10-CM | POA: Diagnosis not present

## 2020-05-01 DIAGNOSIS — Z9013 Acquired absence of bilateral breasts and nipples: Secondary | ICD-10-CM | POA: Diagnosis not present

## 2020-05-01 DIAGNOSIS — C50112 Malignant neoplasm of central portion of left female breast: Secondary | ICD-10-CM | POA: Diagnosis not present

## 2020-05-12 ENCOUNTER — Other Ambulatory Visit: Payer: Self-pay

## 2020-05-12 ENCOUNTER — Encounter: Payer: Self-pay | Admitting: Dermatology

## 2020-05-12 ENCOUNTER — Ambulatory Visit: Payer: Medicare PPO | Admitting: Dermatology

## 2020-05-12 DIAGNOSIS — L720 Epidermal cyst: Secondary | ICD-10-CM

## 2020-05-12 DIAGNOSIS — Z853 Personal history of malignant neoplasm of breast: Secondary | ICD-10-CM

## 2020-05-12 DIAGNOSIS — I8312 Varicose veins of left lower extremity with inflammation: Secondary | ICD-10-CM

## 2020-05-12 DIAGNOSIS — D1800 Hemangioma unspecified site: Secondary | ICD-10-CM

## 2020-05-12 DIAGNOSIS — I872 Venous insufficiency (chronic) (peripheral): Secondary | ICD-10-CM | POA: Diagnosis not present

## 2020-05-12 DIAGNOSIS — Z85820 Personal history of malignant melanoma of skin: Secondary | ICD-10-CM | POA: Diagnosis not present

## 2020-05-12 DIAGNOSIS — D229 Melanocytic nevi, unspecified: Secondary | ICD-10-CM

## 2020-05-12 DIAGNOSIS — L821 Other seborrheic keratosis: Secondary | ICD-10-CM

## 2020-05-12 DIAGNOSIS — Z1283 Encounter for screening for malignant neoplasm of skin: Secondary | ICD-10-CM | POA: Diagnosis not present

## 2020-05-12 DIAGNOSIS — L817 Pigmented purpuric dermatosis: Secondary | ICD-10-CM | POA: Diagnosis not present

## 2020-05-12 DIAGNOSIS — L814 Other melanin hyperpigmentation: Secondary | ICD-10-CM

## 2020-05-12 DIAGNOSIS — I8311 Varicose veins of right lower extremity with inflammation: Secondary | ICD-10-CM | POA: Diagnosis not present

## 2020-05-12 DIAGNOSIS — Z85828 Personal history of other malignant neoplasm of skin: Secondary | ICD-10-CM

## 2020-05-12 DIAGNOSIS — L578 Other skin changes due to chronic exposure to nonionizing radiation: Secondary | ICD-10-CM

## 2020-05-12 MED ORDER — TRETINOIN 0.025 % EX CREA: TOPICAL_CREAM | CUTANEOUS | 0 refills | Status: DC

## 2020-05-12 NOTE — Progress Notes (Signed)
Follow-Up Visit   Subjective  Linda Vazquez is a 74 y.o. female who presents for the following: Annual Exam (Hx SCC - L hand dorsum, and MM - L shoulder). Patient has noticed no new or changing moles, lesions, or spots. The patient presents for Total-Body Skin Exam (TBSE) for skin cancer screening and mole check. The following portions of the chart were reviewed this encounter and updated as appropriate:  Tobacco  Allergies  Meds  Problems  Med Hx  Surg Hx  Fam Hx     Review of Systems:  No other skin or systemic complaints except as noted in HPI or Assessment and Plan.  Objective  Well appearing patient in no apparent distress; mood and affect are within normal limits.  A full examination was performed including scalp, head, eyes, ears, nose, lips, neck, chest, axillae, abdomen, back, buttocks, bilateral upper extremities, bilateral lower extremities, hands, feet, fingers, toes, fingernails, and toenails. All findings within normal limits unless otherwise noted below.  Objective  Face: Smooth white papule(s) with comedones.  Objective  B/L leg: Erythematous, scaly patches involving the ankle and distal lower leg with associated lower leg edema.   Objective  Breasts: Clear to visual exam and palpation. No LAD  Assessment & Plan  [place] Face Start Differin 0.1% gel QHS and if not improving after a few months start Retin A 0.025% QHS. Topical retinoid medications like tretinoin/Retin-A, adapalene/Differin, tazarotene/Fabior, and Epiduo/Epiduo Forte can cause dryness and irritation when first started. Only apply a pea-sized amount to the entire affected area. Avoid applying it around the eyes, edges of mouth and creases at the nose. If you experience irritation, use a good moisturizer first and/or apply the medicine less often. If you are doing well with the medicine, you can increase how often you use it until you are applying every night. Be careful with sun protection while  using this medication as it can make you sensitive to the sun. This medicine should not be used by pregnant women.   tretinoin (RETIN-A) 0.025 % cream - Face  Schamberg's purpura B/L leg Benign, observe.    Stasis dermatitis of both legs B/L leg Recommend graduated compression Benign, observe.    Lipodermatosclerosis of both lower extremities B/L leg Asymptomatic. Graduated compression. Benign, observe.    History of breast cancer Breasts Clear. Observe for recurrence. Call clinic for new or changing lesions.  Recommend regular skin exams, daily broad-spectrum spf 30+ sunscreen use, and photoprotection.    Lentigines - Scattered tan macules - Discussed due to sun exposure - Benign, observe - Call for any changes  Seborrheic Keratoses - Stuck-on, waxy, tan-brown papules and plaques  - Discussed benign etiology and prognosis. - Observe - Call for any changes  Melanocytic Nevi - Tan-brown and/or pink-flesh-colored symmetric macules and papules - Benign appearing on exam today - Observation - Call clinic for new or changing moles - Recommend daily use of broad spectrum spf 30+ sunscreen to sun-exposed areas.   Hemangiomas - Red papules - Discussed benign nature - Observe - Call for any changes  Actinic Damage - diffuse scaly erythematous macules with underlying dyspigmentation - Recommend daily broad spectrum sunscreen SPF 30+ to sun-exposed areas, reapply every 2 hours as needed.  - Call for new or changing lesions.  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  - Call if any new or changing lesions are noted between office visits  History of Melanoma - L shoulder 1976 - WLE with lypmh node biopsy (negative per patient)  - No evidence  of recurrence today - No lymphadenopathy - Recommend regular full body skin exams - Recommend daily broad spectrum sunscreen SPF 30+ to sun-exposed areas, reapply every 2 hours as needed.  - Call if any new or changing lesions are noted between office visits  Skin cancer screening performed today.  Return in about 1 year (around 05/12/2021) for TBSE.  Luther Redo, CMA, am acting as scribe for Sarina Ser, MD .  Documentation: I have reviewed the above documentation for accuracy and completeness, and I agree with the above.  Sarina Ser, MD

## 2020-05-12 NOTE — Patient Instructions (Signed)
Topical retinoid medications like tretinoin/Retin-A, adapalene/Differin, tazarotene/Fabior, and Epiduo/Epiduo Forte can cause dryness and irritation when first started. Only apply a pea-sized amount to the entire affected area. Avoid applying it around the eyes, edges of mouth and creases at the nose. If you experience irritation, use a good moisturizer first and/or apply the medicine less often. If you are doing well with the medicine, you can increase how often you use it until you are applying every night. Be careful with sun protection while using this medication as it can make you sensitive to the sun. This medicine should not be used by pregnant women.   Recommend Differin 0.1% gel apply a pea sized amount to the entire face every night for white bumps.

## 2020-05-20 ENCOUNTER — Encounter: Payer: Self-pay | Admitting: Dermatology

## 2020-06-02 DIAGNOSIS — C50911 Malignant neoplasm of unspecified site of right female breast: Secondary | ICD-10-CM | POA: Diagnosis not present

## 2020-06-02 DIAGNOSIS — Z1379 Encounter for other screening for genetic and chromosomal anomalies: Secondary | ICD-10-CM | POA: Diagnosis not present

## 2020-06-02 DIAGNOSIS — C50912 Malignant neoplasm of unspecified site of left female breast: Secondary | ICD-10-CM | POA: Diagnosis not present

## 2020-06-02 DIAGNOSIS — I890 Lymphedema, not elsewhere classified: Secondary | ICD-10-CM | POA: Diagnosis not present

## 2020-06-05 DIAGNOSIS — M25512 Pain in left shoulder: Secondary | ICD-10-CM | POA: Diagnosis not present

## 2020-06-05 DIAGNOSIS — M5412 Radiculopathy, cervical region: Secondary | ICD-10-CM | POA: Diagnosis not present

## 2020-06-05 DIAGNOSIS — G8929 Other chronic pain: Secondary | ICD-10-CM | POA: Insufficient documentation

## 2020-06-26 DIAGNOSIS — M25512 Pain in left shoulder: Secondary | ICD-10-CM | POA: Diagnosis not present

## 2020-06-26 DIAGNOSIS — M50223 Other cervical disc displacement at C6-C7 level: Secondary | ICD-10-CM | POA: Diagnosis not present

## 2020-06-26 DIAGNOSIS — M50222 Other cervical disc displacement at C5-C6 level: Secondary | ICD-10-CM | POA: Diagnosis not present

## 2020-06-26 DIAGNOSIS — M5412 Radiculopathy, cervical region: Secondary | ICD-10-CM | POA: Diagnosis not present

## 2020-06-26 DIAGNOSIS — M25511 Pain in right shoulder: Secondary | ICD-10-CM | POA: Diagnosis not present

## 2020-07-01 DIAGNOSIS — M5412 Radiculopathy, cervical region: Secondary | ICD-10-CM | POA: Diagnosis not present

## 2020-07-01 DIAGNOSIS — Z6833 Body mass index (BMI) 33.0-33.9, adult: Secondary | ICD-10-CM | POA: Diagnosis not present

## 2020-07-01 DIAGNOSIS — I10 Essential (primary) hypertension: Secondary | ICD-10-CM | POA: Diagnosis not present

## 2020-08-05 ENCOUNTER — Encounter: Payer: Self-pay | Admitting: Family Medicine

## 2020-08-05 ENCOUNTER — Ambulatory Visit: Payer: Medicare PPO | Admitting: Family Medicine

## 2020-08-05 ENCOUNTER — Other Ambulatory Visit: Payer: Self-pay

## 2020-08-05 VITALS — BP 120/72 | HR 72 | Ht 62.0 in | Wt 186.0 lb

## 2020-08-05 DIAGNOSIS — E119 Type 2 diabetes mellitus without complications: Secondary | ICD-10-CM

## 2020-08-05 NOTE — Progress Notes (Signed)
Date:  08/05/2020   Name:  Linda Vazquez   DOB:  01-Feb-1946   MRN:  [id number]   Chief Complaint: Diabetes  Diabetes She presents for her follow-up diabetic visit. She has type 2 diabetes mellitus. Her disease course has been stable. There are no hypoglycemic associated symptoms. Pertinent negatives for hypoglycemia include no dizziness, headaches or nervousness/anxiousness. There are no diabetic associated symptoms. Pertinent negatives for diabetes include no blurred vision, no chest pain, no fatigue, no foot paresthesias, no foot ulcerations, no polydipsia, no polyphagia, no polyuria, no visual change, no weakness and no weight loss. There are no hypoglycemic complications. Symptoms are stable. There are no diabetic complications. Risk factors for coronary artery disease include diabetes mellitus, dyslipidemia and obesity. Current diabetic treatment includes oral agent (monotherapy) (victoza). She is compliant with treatment all of the time. Her weight is stable. She is following a generally healthy diet. Meal planning includes avoidance of concentrated sweets and carbohydrate counting. Her home blood glucose trend is fluctuating minimally. Her breakfast blood glucose is taken between 8-9 am. Her breakfast blood glucose range is generally 110-130 mg/dl. An ACE inhibitor/angiotensin II receptor blocker is being taken. Eye exam is current.    Lab Results  Component Value Date   CREATININE 1.17 (H) 04/15/2020   BUN 30 (H) 04/15/2020   NA 144 04/15/2020   K 3.8 04/15/2020   CL 100 04/15/2020   CO2 25 04/15/2020   Lab Results  Component Value Date   CHOL 159 04/15/2020   HDL 65 04/15/2020   LDLCALC 75 04/15/2020   TRIG 103 04/15/2020   CHOLHDL 3.0 01/17/2019   Lab Results  Component Value Date   TSH 2.170 04/15/2020   Lab Results  Component Value Date   HGBA1C 7.0 (H) 04/15/2020   No results found for: WBC, HGB, HCT, MCV, PLT Lab Results  Component Value Date   ALT 15  10/16/2019   AST 19 10/16/2019   ALKPHOS 105 10/16/2019   BILITOT 0.4 10/16/2019     Review of Systems  Constitutional: Negative.  Negative for chills, fatigue, fever, unexpected weight change and weight loss.  HENT: Negative for congestion, ear discharge, ear pain, rhinorrhea, sinus pressure, sneezing and sore throat.   Eyes: Negative for blurred vision, photophobia, pain, discharge, redness and itching.  Respiratory: Negative for cough, shortness of breath, wheezing and stridor.   Cardiovascular: Negative for chest pain.  Gastrointestinal: Negative for abdominal pain, blood in stool, constipation, diarrhea, nausea and vomiting.  Endocrine: Negative for cold intolerance, heat intolerance, polydipsia, polyphagia and polyuria.  Genitourinary: Negative for dysuria, flank pain, frequency, hematuria, menstrual problem, pelvic pain, urgency, vaginal bleeding and vaginal discharge.  Musculoskeletal: Negative for arthralgias, back pain and myalgias.  Skin: Negative for rash.  Allergic/Immunologic: Negative for environmental allergies and food allergies.  Neurological: Negative for dizziness, weakness, light-headedness, numbness and headaches.  Hematological: Negative for adenopathy. Does not bruise/bleed easily.  Psychiatric/Behavioral: Negative for dysphoric mood. The patient is not nervous/anxious.     Patient Active Problem List   Diagnosis Date Noted  . Venous stasis dermatitis of left lower extremity 03/07/2019  . Venous insufficiency of both lower extremities 03/07/2019  . Obesity (BMI 35.0-39.9 without comorbidity) 03/07/2019  . Type 2 diabetes mellitus without complication, without long-term current use of insulin ([place]) 01/17/2019  . Nontraumatic complete tear of right rotator cuff 12/13/2018  . Rotator cuff tendinitis, right 12/13/2018  . Genetic testing 08/29/2017  . Asthma, cough variant 05/29/2015  . Disorder of  mediastinum 12/30/2014  . Chronic cough 10/31/2014  . Esophageal  dysfunction 09/23/2014  . 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 BP (high blood pressure) 06/04/2013  . Dysphonia 03/01/2013  . Adult hypothyroidism 03/01/2013  . Post-tussive emesis 03/01/2013  . Malignant neoplasm of breast ([place]) 02/09/2013  . Acquired lymphedema 02/09/2013  . 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 Ace Inhibitors Cough    Per pt bp med caused cough possible ace inhibitor  . Sulfa Antibiotics Rash    Other Reaction: Not Assessed    Past Surgical History:  Procedure Laterality Date  . BIOPSY THYROID     benign  . BREAST LUMPECTOMY Right   . COLONOSCOPY  2006   repeat in 10 yrs- [facility] Dr  . KNEE ARTHROSCOPY WITH MENISCAL REPAIR Bilateral   . MASTECTOMY Left   . MASTECTOMY Right    2 yrs later  . MEDIASTINAL MASS EXCISION    . 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 TONSILLECTOMY    . tubal cauterization      Social History   Tobacco Use  . Smoking status: Never Smoker  . Smokeless tobacco: Never Used  . Tobacco comment: Smoking cessation materials not required  Vaping Use  . Vaping Use: Never used  Substance Use Topics  . Alcohol use: Yes    Alcohol/week: 0.0 standard drinks    Comment: socially 1-2 x month  . Drug use: No     Medication list has been reviewed and updated.  Current Meds  Medication Sig  . Accu-Chek Softclix Lancets lancets 100 each by Other route daily. Use as instructed/ Dx E11.9  . amitriptyline (ELAVIL) 10 MG tablet Take 1 tablet by mouth at bedtime as needed. Dr Rodolph Bong  . ammonium lactate (LAC-HYDRIN) 12 % lotion Apply 1 application topically 2 (two) times daily. Dr Nehemiah Massed  . aspirin EC 81 MG tablet Take 1 tablet by mouth daily.  Marland Kitchen atorvastatin (LIPITOR) 10 MG tablet TAKE 1 TABLET BY MOUTH EVERY DAY  . azelastine (ASTELIN) 0.1 % nasal spray Place 1 spray into the nose 2 (two) times daily. [facility]  . BD PEN NEEDLE NANO 2ND GEN 32G X 4 MM MISC ONE INJECTION DAILY   . benzonatate (TESSALON) 100 MG capsule TAKE 1 CAPSULE (100 MG TOTAL) BY MOUTH 2 (TWO) TIMES DAILY AS NEEDED  . Blood Glucose Monitoring Suppl (ACCU-CHEK GUIDE ME) w/Device KIT   . carbamide peroxide (DEBROX) 6.5 % OTIC solution Place 5 drops into both ears 2 (two) times daily. (Patient taking differently: Place 5 drops into both ears 2 (two) times daily as needed. )  . cetirizine-pseudoephedrine (ZYRTEC-D) 5-120 MG tablet Take 1 tablet by mouth daily. OTC  . clonazePAM (KLONOPIN) 0.5 MG tablet Take 0.5 mg by mouth daily. Otolaryn. DR Patrice Paradise  . glucose blood (ACCU-CHEK GUIDE) test strip SoftClix/ Dx E11.9  . hydrochlorothiazide (HYDRODIURIL) 25 MG tablet Take 1 tablet (25 mg total) by mouth daily.  Marland Kitchen levothyroxine (SYNTHROID) 50 MCG tablet Take 1 tablet (50 mcg total) by mouth daily.  Marland Kitchen liraglutide (VICTOZA) 18 MG/3ML SOPN Inject 0.1 mLs (0.6 mg total) into the skin daily.  . metFORMIN (GLUCOPHAGE-XR) 750 MG 24 hr tablet Take 1 tablet (750 mg total) by mouth at bedtime. TAKE 1 TABLET BY MOUTH EVERY DAY WITH supper  . Multiple Vitamin (MULTIVITAMIN WITH MINERALS) TABS tablet Take 1 tablet by mouth daily.  Marland Kitchen omeprazole (PRILOSEC) 40 MG capsule  TAKE 1 CAPSULE BY MOUTH EVERY DAY  . Probiotic Product (PROBIOTIC DAILY PO) Take 1 each by mouth daily. gummie  . SYMBICORT 160-4.5 MCG/ACT inhaler pulmonology  . tretinoin (RETIN-A) 0.025 % cream Apply a pea sized amount to the entire face QHS.  Marland Kitchen Vitamin D, Cholecalciferol, 50 MCG (2000 UT) CAPS Take 1 capsule by mouth daily.    PHQ 2/9 Scores 08/05/2020 04/15/2020 02/13/2020 01/21/2020  PHQ - 2 Score 0 0 0 0  PHQ- 9 Score 0 0 - 0    GAD 7 : Generalized Anxiety Score 08/05/2020 04/15/2020 01/21/2020 10/16/2019  Nervous, Anxious, on Edge 0 0 0 0  Control/stop worrying 0 0 0 0  Worry too much - different things 0 0 0 0  Trouble relaxing 0 0 0 0  Restless 0 0 0 0  Easily annoyed or irritable 0 0 0 0  Afraid - awful might happen 0 0 0 0  Total GAD 7 Score 0  0 0 0    BP Readings from Last 3 Encounters:  08/05/20 120/72  04/15/20 (!) 138/80  01/21/20 110/64    Physical Exam Vitals and nursing note reviewed.  Constitutional:      Appearance: She is well-developed.  HENT:     Head: Normocephalic.     Right Ear: Tympanic membrane, ear canal and external ear normal.     Left Ear: Tympanic membrane, ear canal and external ear normal.     Nose: Nose normal. No congestion or rhinorrhea.     Mouth/Throat:     Mouth: Mucous membranes are moist.  Eyes:     General: Lids are everted, no foreign bodies appreciated. No scleral icterus.       Left eye: No foreign body or hordeolum.     Conjunctiva/sclera: Conjunctivae normal.     Right eye: Right conjunctiva is not injected.     Left eye: Left conjunctiva is not injected.     Pupils: Pupils are equal, round, and reactive to light.  Neck:     Thyroid: No thyromegaly.     Vascular: No JVD.     Trachea: No tracheal deviation.  Cardiovascular:     Rate and Rhythm: Normal rate and regular rhythm.     Heart sounds: Normal heart sounds, S1 normal and S2 normal. No murmur heard.  No systolic murmur is present.  No diastolic murmur is present.  No friction rub. No gallop. No S3 or S4 sounds.   Pulmonary:     Effort: Pulmonary effort is normal. No respiratory distress.     Breath sounds: Normal breath sounds. No wheezing, rhonchi or rales.  Abdominal:     General: Bowel sounds are normal.     Palpations: Abdomen is soft. There is no mass.     Tenderness: There is no abdominal tenderness. There is no guarding or rebound.  Musculoskeletal:        General: No tenderness. Normal range of motion.     Cervical back: Normal range of motion and neck supple.  Lymphadenopathy:     Cervical: No cervical adenopathy.  Skin:    General: Skin is warm.     Findings: No erythema or rash.  Neurological:     Mental Status: She is alert and oriented to person, place, and time.     Cranial Nerves: No cranial  nerve deficit.     Deep Tendon Reflexes: Reflexes normal.  Psychiatric:        Mood and Affect: Mood is not anxious  or depressed.     Wt Readings from Last 3 Encounters:  08/05/20 186 lb (84.4 kg)  04/15/20 187 lb (84.8 kg)  01/21/20 192 lb (87.1 kg)    BP 120/72   Pulse 72   Ht _0  (1.575 m)   Wt 186 lb (84.4 kg)   BMI 34.02 kg/m   Assessment and Plan:                                                    Patient's chart was reviewed for previous encounters. 1. Type 2 diabetes mellitus without complication, without long-term current use of insulin ([facility]) Chronic.  Controlled.  Stable.  Will continue Victoza 0.6 mg daily and Glucophage Exar 750 mg once a day.  Will check A1c. - HgB A1c

## 2020-08-06 LAB — HEMOGLOBIN A1C
Est. average glucose Bld gHb Est-mCnc: 146 mg/dL
Hgb A1c MFr Bld: 6.7 % — ABNORMAL HIGH (ref 4.8–5.6)

## 2020-08-11 DIAGNOSIS — M5412 Radiculopathy, cervical region: Secondary | ICD-10-CM | POA: Diagnosis not present

## 2020-09-03 DIAGNOSIS — Z6833 Body mass index (BMI) 33.0-33.9, adult: Secondary | ICD-10-CM | POA: Diagnosis not present

## 2020-09-03 DIAGNOSIS — I10 Essential (primary) hypertension: Secondary | ICD-10-CM | POA: Diagnosis not present

## 2020-09-03 DIAGNOSIS — M5412 Radiculopathy, cervical region: Secondary | ICD-10-CM | POA: Diagnosis not present

## 2020-09-08 ENCOUNTER — Other Ambulatory Visit: Payer: Self-pay | Admitting: Internal Medicine

## 2020-09-08 DIAGNOSIS — E119 Type 2 diabetes mellitus without complications: Secondary | ICD-10-CM

## 2020-09-24 ENCOUNTER — Other Ambulatory Visit: Payer: Self-pay

## 2020-09-24 ENCOUNTER — Encounter: Payer: Self-pay | Admitting: Dermatology

## 2020-09-24 ENCOUNTER — Ambulatory Visit: Payer: Medicare PPO | Admitting: Dermatology

## 2020-09-24 DIAGNOSIS — L820 Inflamed seborrheic keratosis: Secondary | ICD-10-CM | POA: Diagnosis not present

## 2020-09-24 DIAGNOSIS — L720 Epidermal cyst: Secondary | ICD-10-CM

## 2020-09-24 DIAGNOSIS — L578 Other skin changes due to chronic exposure to nonionizing radiation: Secondary | ICD-10-CM | POA: Diagnosis not present

## 2020-09-24 DIAGNOSIS — L821 Other seborrheic keratosis: Secondary | ICD-10-CM | POA: Diagnosis not present

## 2020-09-24 DIAGNOSIS — D0461 Carcinoma in situ of skin of right upper limb, including shoulder: Secondary | ICD-10-CM

## 2020-09-24 DIAGNOSIS — D492 Neoplasm of unspecified behavior of bone, soft tissue, and skin: Secondary | ICD-10-CM

## 2020-09-24 MED ORDER — TRETINOIN 0.025 % EX CREA: TOPICAL_CREAM | CUTANEOUS | 0 refills | Status: DC

## 2020-09-24 NOTE — Patient Instructions (Signed)

## 2020-09-24 NOTE — Progress Notes (Unsigned)
Follow-Up Visit   Subjective  Linda Vazquez is a 75 y.o. female who presents for the following: check spots (R hand, hx of itching) and milia (Face, Retin A 0.025% qhs).  The following portions of the chart were reviewed this encounter and updated as appropriate:   Tobacco  Allergies  Meds  Problems  Med Hx  Surg Hx  Fam Hx     Review of Systems:  No other skin or systemic complaints except as noted in HPI or Assessment and Plan.  Objective  Well appearing patient in no apparent distress; mood and affect are within normal limits.  A focused examination was performed including R hand. Relevant physical exam findings are noted in the Assessment and Plan.  Objective  face: White paps  lower lip  Objective  R dorsum wrist x 1: Erythematous keratotic or waxy stuck-on papule or plaque.   Objective  R dorsum hand proximal to index finger mcp: 1.0cm hyperkeratotic pap   Assessment & Plan  Milia face  Cont Tretinoin 0.025% cr qhs  Discussed extraction  Reordered Medications tretinoin (RETIN-A) 0.025 % cream  Inflamed seborrheic keratosis R dorsum wrist x 1  Destruction of lesion - R dorsum wrist x 1 Complexity: simple   Destruction method: cryotherapy   Informed consent: discussed and consent obtained   Timeout:  patient name, date of birth, surgical site, and procedure verified Lesion destroyed using liquid nitrogen: Yes   Region frozen until ice ball extended beyond lesion: Yes   Outcome: patient tolerated procedure well with no complications   Post-procedure details: wound care instructions given    Neoplasm of skin R dorsum hand proximal to index finger mcp  Epidermal / dermal shaving  Lesion diameter (cm):  1 Informed consent: discussed and consent obtained   Timeout: patient name, date of birth, surgical site, and procedure verified   Procedure prep:  Patient was prepped and draped in usual sterile fashion Prep type:  Isopropyl  alcohol Anesthesia: the lesion was anesthetized in a standard fashion   Anesthetic:  1% lidocaine w/ epinephrine 1-100,000 buffered w/ 8.4% NaHCO3 Instrument used: flexible razor blade   Hemostasis achieved with: pressure, aluminum chloride and electrodesiccation   Outcome: patient tolerated procedure well   Post-procedure details: sterile dressing applied and wound care instructions given   Dressing type: bandage and petrolatum    Destruction of lesion Complexity: extensive   Destruction method: electrodesiccation and curettage   Informed consent: discussed and consent obtained   Timeout:  patient name, date of birth, surgical site, and procedure verified Procedure prep:  Patient was prepped and draped in usual sterile fashion Prep type:  Isopropyl alcohol Anesthesia: the lesion was anesthetized in a standard fashion   Anesthetic:  1% lidocaine w/ epinephrine 1-100,000 buffered w/ 8.4% NaHCO3 Curettage performed in three different directions: Yes   Electrodesiccation performed over the curetted area: Yes   Lesion length (cm):  1 Lesion width (cm):  1 Margin per side (cm):  0.2 Final wound size (cm):  1.4 Hemostasis achieved with:  pressure, aluminum chloride and electrodesiccation Outcome: patient tolerated procedure well with no complications   Post-procedure details: sterile dressing applied and wound care instructions given   Dressing type: bandage and bacitracin    Specimen 1 - Surgical pathology Differential Diagnosis: D48.5 R/O SCC Check Margins: No 1.0cm hyperkeratotic pap EDC today  Actinic Damage - chronic, secondary to cumulative UV radiation exposure/sun exposure over time - diffuse scaly erythematous macules with underlying dyspigmentation - Recommend daily  broad spectrum sunscreen SPF 30+ to sun-exposed areas, reapply every 2 hours as needed.  - Call for new or changing lesions.  Seborrheic Keratoses - Stuck-on, waxy, tan-brown papules and plaques  - Discussed  benign etiology and prognosis. - Observe - Call for any changes  Return as scheduled.   I, Othelia Pulling, RMA, am acting as scribe for Sarina Ser, MD .  Documentation: I have reviewed the above documentation for accuracy and completeness, and I agree with the above.  Sarina Ser, MD

## 2020-09-29 ENCOUNTER — Telehealth: Payer: Self-pay

## 2020-09-29 NOTE — Telephone Encounter (Signed)
Patient informed of pathology results 

## 2020-09-29 NOTE — Telephone Encounter (Signed)
-----   Message from Ralene Bathe, MD sent at 09/25/2020  6:16 PM EST ----- Diagnosis Skin , right dorsum hand proximal to index finger MCP SQUAMOUS CELL CARCINOMA IN SITU, HYPERTROPHIC  Cancer - SCC in situ Already treated Recheck next visit

## 2020-09-30 ENCOUNTER — Encounter: Payer: Self-pay | Admitting: Family Medicine

## 2020-09-30 ENCOUNTER — Ambulatory Visit: Payer: Self-pay

## 2020-09-30 ENCOUNTER — Telehealth (INDEPENDENT_AMBULATORY_CARE_PROVIDER_SITE_OTHER): Payer: Medicare PPO | Admitting: Family Medicine

## 2020-09-30 VITALS — Temp 98.0°F

## 2020-09-30 DIAGNOSIS — J45991 Cough variant asthma: Secondary | ICD-10-CM

## 2020-09-30 DIAGNOSIS — E119 Type 2 diabetes mellitus without complications: Secondary | ICD-10-CM

## 2020-09-30 DIAGNOSIS — U071 COVID-19: Secondary | ICD-10-CM

## 2020-09-30 MED ORDER — METFORMIN HCL ER 500 MG PO TB24: 500.0000 mg | ORAL_TABLET | Freq: Every day | ORAL | 1 refills | Status: DC

## 2020-09-30 MED ORDER — GUAIFENESIN-CODEINE 100-10 MG/5ML PO SYRP: 5.0000 mL | ORAL_SOLUTION | Freq: Four times a day (QID) | ORAL | 0 refills | Status: DC | PRN

## 2020-09-30 NOTE — Telephone Encounter (Signed)
Patient called stating that she received a positive COVID -19 test  Result today.  She states that she has no symptoms.  She states that they were notified by their [place] [facility] that several staff and residents had tested positive for COVID-19  She tested positive and her husband negative. She states she has not had fever She has had some cold like symptoms runny nose.  She states her worst symptoms is pain to the front of her chest from shoulder to shoulder down to her abdomin.  She states that she had this pain last year and was DX with costal chondritis. She rates this pain at 0 now and states it hurts when trying to lay down in bed at night. She denies other symptoms. Per protocol Virtual appointment was arranged with help of office for today. Patient verified understanding.  Reason for Disposition . HIGH RISK for severe COVID complications (e.g., age > 78 years, obesity with BMI > 25, pregnant, chronic lung disease or other chronic medical condition)  (Exception: Already seen by PCP and no new or worsening symptoms.)  Answer Assessment - Initial Assessment Questions 1. COVID-19 DIAGNOSIS: "Who made your COVID-19 diagnosis?" "Was it confirmed by a positive lab test?" If not diagnosed by a HCP, ask "Are there lots of cases (community spread) where you live?" Note: See public health department website, if unsure.     Dx today 2. COVID-19 EXPOSURE: "Was there any known exposure to COVID before the symptoms began?" CDC Definition of close contact: within 6 feet (2 meters) for a total of 15 minutes or more over a 24-hour period.      Not sure 3. ONSET: "When did the COVID-19 symptoms start?"      No symptoms but tested because so many in her community 4. WORST SYMPTOM: "What is your worst symptom?" (e.g., cough, fever, shortness of breath, muscle aches)     Chest shoulder to shoulder, most left, runny nose 5. COUGH: "Do you have a cough?" If Yes, ask: "How bad is the cough?"       no 6. FEVER:  "Do you have a fever?" If Yes, ask: "What is your temperature, how was it measured, and when did it start?"     No unsure about today 7. RESPIRATORY STATUS: "Describe your breathing?" (e.g., shortness of breath, wheezing, unable to speak)     no 8. BETTER-SAME-WORSE: "Are you getting better, staying the same or getting worse compared to yesterday?"  If getting worse, ask, "In what way?"     same 9. HIGH RISK DISEASE: "Do you have any chronic medical problems?" (e.g., asthma, heart or lung disease, weak immune system, obesity, etc.)    costochondritis 10. VACCINE: "Have you gotten the COVID-19 vaccine?" If Yes ask: "Which one, how many shots, when did you get it?"       All vaccine and booster 11. PREGNANCY: "Is there any chance you are pregnant?" "When was your last menstrual period?"       N/A 12. OTHER SYMPTOMS: "Do you have any other symptoms?"  (e.g., chills, fatigue, headache, loss of smell or taste, muscle pain, sore throat; new loss of smell or taste especially support the diagnosis of COVID-19)       none  Protocols used: CORONAVIRUS (COVID-19) DIAGNOSED OR SUSPECTED-A-AH

## 2020-09-30 NOTE — Progress Notes (Signed)
Date:  09/30/2020   Name:  Linda Vazquez   DOB:  [birth year]   MRN:  [id number]   Chief Complaint: Covid Positive (Was tested this am at [facility]- positive. Chest pressure, dry cough)  I connected withthis patient, Linda Vazquez, by telephoneat the patient's home.  I verified that I am speaking with the correct person using two identifiers. This visit was conducted via telephone due to the Covid-19 outbreak from my office at [facility] in [place], [place]. I discussed the limitations, risks, security and privacy concerns of performing an evaluation and management service by telephone. I also discussed with the patient that there may be a patient responsible charge related to this service. The patient expressed understanding and agreed to proceed.  URI  This is a new problem. The current episode started in the past 7 days. The problem has been waxing and waning. There has been no fever. Associated symptoms include coughing, a plugged ear sensation, rhinorrhea and sneezing. Pertinent negatives include no sinus pain, sore throat or wheezing. She has tried inhaler use for the symptoms.    Lab Results  Component Value Date   CREATININE 1.17 (H) 04/15/2020   BUN 30 (H) 04/15/2020   NA 144 04/15/2020   K 3.8 04/15/2020   CL 100 04/15/2020   CO2 25 04/15/2020   Lab Results  Component Value Date   CHOL 159 04/15/2020   HDL 65 04/15/2020   LDLCALC 75 04/15/2020   TRIG 103 04/15/2020   CHOLHDL 3.0 01/17/2019   Lab Results  Component Value Date   TSH 2.170 04/15/2020   Lab Results  Component Value Date   HGBA1C 6.7 (H) 08/05/2020   No results found for: WBC, HGB, HCT, MCV, PLT Lab Results  Component Value Date   ALT 15 10/16/2019   AST 19 10/16/2019   ALKPHOS 105 10/16/2019   BILITOT 0.4 10/16/2019     Review of Systems  HENT: Positive for rhinorrhea, sinus pressure and sneezing. Negative for nosebleeds, postnasal drip, sinus pain and sore throat.   Respiratory: Positive  for cough. Negative for chest tightness, shortness of breath and wheezing.     Patient Active Problem List   Diagnosis Date Noted  . Venous stasis dermatitis of left lower extremity 03/07/2019  . Venous insufficiency of both lower extremities 03/07/2019  . Obesity (BMI 35.0-39.9 without comorbidity) 03/07/2019  . Type 2 diabetes mellitus without complication, without long-term current use of insulin ([place]) 01/17/2019  . Nontraumatic complete tear of right rotator cuff 12/13/2018  . Rotator cuff tendinitis, right 12/13/2018  . Genetic testing 08/29/2017  . Asthma, cough variant 05/29/2015  . 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 Chronic cough 10/31/2014  . Esophageal dysfunction 09/23/2014  . 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 BP (high blood pressure) 06/04/2013  . Dysphonia 03/01/2013  . Adult hypothyroidism 03/01/2013  . Post-tussive emesis 03/01/2013  . Malignant neoplasm of breast ([place]) 02/09/2013  . Acquired lymphedema 02/09/2013  . 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 Ace Inhibitors Cough    Per pt bp med caused cough possible ace inhibitor  . Sulfa Antibiotics Rash    Other Reaction: Not Assessed    Past Surgical History:  Procedure Laterality Date  . BIOPSY THYROID     benign  . BREAST LUMPECTOMY Right   . COLONOSCOPY  2006   repeat in 10 yrs- [facility] Dr  . KNEE ARTHROSCOPY WITH MENISCAL REPAIR Bilateral   . MASTECTOMY Left   .  MASTECTOMY Right    2 yrs later  . MEDIASTINAL MASS EXCISION    . 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 TONSILLECTOMY    . tubal cauterization      Social History   Tobacco Use  . Smoking status: Never Smoker  . Smokeless tobacco: Never Used  . Tobacco comment: Smoking cessation materials not required  Vaping Use  . Vaping Use: Never used  Substance Use Topics  . Alcohol use: Yes    Alcohol/week: 0.0 standard drinks    Comment: socially 1-2 x month  . Drug use: No      Medication list has been reviewed and updated.  Current Meds  Medication Sig  . Accu-Chek Softclix Lancets lancets 100 each by Other route daily. Use as instructed/ Dx E11.9  . ammonium lactate (LAC-HYDRIN) 12 % lotion Apply 1 application topically 2 (two) times daily. Dr Nehemiah Massed  . aspirin EC 81 MG tablet Take 1 tablet by mouth daily.  Marland Kitchen atorvastatin (LIPITOR) 10 MG tablet TAKE 1 TABLET BY MOUTH EVERY DAY  . azelastine (ASTELIN) 0.1 % nasal spray Place 1 spray into the nose 2 (two) times daily. [facility]  . BD PEN NEEDLE NANO 2ND GEN 32G X 4 MM MISC ONE INJECTION DAILY  . benzonatate (TESSALON) 100 MG capsule TAKE 1 CAPSULE (100 MG TOTAL) BY MOUTH 2 (TWO) TIMES DAILY AS NEEDED  . Blood Glucose Monitoring Suppl (ACCU-CHEK GUIDE ME) w/Device KIT   . carbamide peroxide (DEBROX) 6.5 % OTIC solution Place 5 drops into both ears 2 (two) times daily. (Patient taking differently: Place 5 drops into both ears 2 (two) times daily as needed.)  . cetirizine-pseudoephedrine (ZYRTEC-D) 5-120 MG tablet Take 1 tablet by mouth daily. OTC  . clonazePAM (KLONOPIN) 0.5 MG tablet Take 0.5 mg by mouth daily. Otolaryn. DR Patrice Paradise  . glucose blood (ACCU-CHEK GUIDE) test strip SoftClix/ Dx E11.9  . hydrochlorothiazide (HYDRODIURIL) 25 MG tablet Take 1 tablet (25 mg total) by mouth daily.  Marland Kitchen levothyroxine (SYNTHROID) 50 MCG tablet Take 1 tablet (50 mcg total) by mouth daily.  Marland Kitchen liraglutide (VICTOZA) 18 MG/3ML SOPN Inject 0.1 mLs (0.6 mg total) into the skin daily.  . montelukast (SINGULAIR) 10 MG tablet Take 1 tablet (10 mg total) by mouth at bedtime.  . Multiple Vitamin (MULTIVITAMIN WITH MINERALS) TABS tablet Take 1 tablet by mouth daily.  Marland Kitchen omeprazole (PRILOSEC) 40 MG capsule TAKE 1 CAPSULE BY MOUTH EVERY DAY  . Probiotic Product (PROBIOTIC DAILY PO) Take 1 each by mouth daily. gummie  . SYMBICORT 160-4.5 MCG/ACT inhaler pulmonology  . tretinoin (RETIN-A) 0.025 % cream Apply a pea sized amount to the entire  face QHS.  Marland Kitchen Vitamin D, Cholecalciferol, 50 MCG (2000 UT) CAPS Take 1 capsule by mouth daily.    PHQ 2/9 Scores 08/05/2020 04/15/2020 02/13/2020 01/21/2020  PHQ - 2 Score 0 0 0 0  PHQ- 9 Score 0 0 - 0    GAD 7 : Generalized Anxiety Score 08/05/2020 04/15/2020 01/21/2020 10/16/2019  Nervous, Anxious, on Edge 0 0 0 0  Control/stop worrying 0 0 0 0  Worry too much - different things 0 0 0 0  Trouble relaxing 0 0 0 0  Restless 0 0 0 0  Easily annoyed or irritable 0 0 0 0  Afraid - awful might happen 0 0 0 0  Total GAD 7 Score 0 0 0 0    BP Readings from Last 3 Encounters:  08/05/20 120/72  04/15/20 (!) 138/80  01/21/20 110/64    Physical Exam Nursing note reviewed.     Wt Readings from Last 3 Encounters:  08/05/20 186 lb (84.4 kg)  04/15/20 187 lb (84.8 kg)  01/21/20 192 lb (87.1 kg)    Temp 98 F (36.7 C)   Assessment and Plan: 1. COVID-19 virus infection New onset.  Persistent.  Stable.  Patient began feeling the onset earlier in the week and went to a local testing area and was noted to be positive for COVID.  Patient has Ladona Ridgel but wanted to talk about further concerns and therapy.  At this point in time there is no dyspnea fever or instability patient has been instructed to hydrate liberally, Tylenol for fever control, and maintain vigilance with her breathing given her history of asthma.  2. Asthma, cough variant As noted above patient has COVID variant but at this point in time she does have baseline asthma with cough.  We will prescribe Robitussin-AC to be used primarily at night so she can rest.  Patient's been instructed to continue her Symbicort and rescue inhaler of albuterol.  We would prefer not to use an antibiotic given that this is a viral concern but if symptoms do continue and should worsen patient has been instructed to go to a respiratory/hospital setting for evaluation and possible chest x-ray and pulse ox evaluation.  3. Type 2 diabetes mellitus  without complication, without long-term current use of insulin ([place]) Patient has a history of diabetes for which she has not taken her metformin on a regular basis due to diarrhea.  We will decrease her metformin to 500 XR to be used once a day rather than the 750 XR.  We will recheck patient in 6 weeks as to control of her diabetes at this regimen and dosing.   I spent 13 minutes with this patient, More than 50% of that time was spent in face to face education, counseling and care coordination.

## 2020-10-15 DIAGNOSIS — M5412 Radiculopathy, cervical region: Secondary | ICD-10-CM | POA: Diagnosis not present

## 2020-10-15 DIAGNOSIS — I10 Essential (primary) hypertension: Secondary | ICD-10-CM | POA: Diagnosis not present

## 2020-10-15 DIAGNOSIS — Z6833 Body mass index (BMI) 33.0-33.9, adult: Secondary | ICD-10-CM | POA: Diagnosis not present

## 2020-10-19 ENCOUNTER — Other Ambulatory Visit: Payer: Self-pay | Admitting: Family Medicine

## 2020-10-19 DIAGNOSIS — R053 Chronic cough: Secondary | ICD-10-CM

## 2020-10-19 DIAGNOSIS — R490 Dysphonia: Secondary | ICD-10-CM

## 2020-10-22 ENCOUNTER — Other Ambulatory Visit: Payer: Self-pay | Admitting: Family Medicine

## 2020-11-05 ENCOUNTER — Other Ambulatory Visit: Payer: Self-pay | Admitting: Family Medicine

## 2020-11-05 ENCOUNTER — Other Ambulatory Visit: Payer: Self-pay

## 2020-11-05 DIAGNOSIS — K219 Gastro-esophageal reflux disease without esophagitis: Secondary | ICD-10-CM

## 2020-11-05 MED ORDER — OMEPRAZOLE 40 MG PO CPDR: 40.0000 mg | DELAYED_RELEASE_CAPSULE | Freq: Every day | ORAL | 0 refills | Status: DC

## 2020-11-20 ENCOUNTER — Ambulatory Visit: Payer: Medicare PPO | Admitting: Family Medicine

## 2020-11-27 ENCOUNTER — Other Ambulatory Visit: Payer: Self-pay | Admitting: Family Medicine

## 2020-11-28 ENCOUNTER — Other Ambulatory Visit: Payer: Self-pay | Admitting: Family Medicine

## 2020-11-28 DIAGNOSIS — E782 Mixed hyperlipidemia: Secondary | ICD-10-CM

## 2020-12-03 ENCOUNTER — Other Ambulatory Visit: Payer: Self-pay

## 2020-12-03 ENCOUNTER — Encounter: Payer: Self-pay | Admitting: Family Medicine

## 2020-12-03 ENCOUNTER — Ambulatory Visit
Admission: RE | Admit: 2020-12-03 | Discharge: 2020-12-03 | Disposition: A | Discharge status: Home / Self Care | Payer: Medicare PPO | Source: Ambulatory Visit | Attending: Family Medicine | Admitting: Family Medicine

## 2020-12-03 ENCOUNTER — Ambulatory Visit: Payer: Medicare PPO | Admitting: Family Medicine

## 2020-12-03 ENCOUNTER — Ambulatory Visit
Admission: RE | Admit: 2020-12-03 | Discharge: 2020-12-03 | Disposition: A | Discharge status: Home / Self Care | Payer: Medicare PPO | Attending: Family Medicine | Admitting: Family Medicine

## 2020-12-03 VITALS — BP 120/70 | HR 68 | Ht 62.0 in | Wt 190.0 lb

## 2020-12-03 DIAGNOSIS — S29011A Strain of muscle and tendon of front wall of thorax, initial encounter: Secondary | ICD-10-CM | POA: Diagnosis not present

## 2020-12-03 DIAGNOSIS — R0782 Intercostal pain: Secondary | ICD-10-CM

## 2020-12-03 DIAGNOSIS — R0781 Pleurodynia: Secondary | ICD-10-CM | POA: Diagnosis not present

## 2020-12-03 MED ORDER — NAPROXEN 500 MG PO TABS: 500.0000 mg | ORAL_TABLET | Freq: Two times a day (BID) | ORAL | 0 refills | Status: DC

## 2020-12-03 NOTE — Progress Notes (Signed)
Date:  12/03/2020   Name:  Linda Vazquez   DOB:  1946/03/29   MRN:  [id number]   Chief Complaint: Chest Pain  Chest Pain  This is a recurrent problem. The current episode started in the past 7 days. The onset quality is gradual. The problem occurs constantly. The problem has been waxing and waning. The pain is present in the lateral region. The pain is at a severity of 5/10. The pain is moderate. The quality of the pain is described as heavy. The pain radiates to the left arm. Associated symptoms include exertional chest pressure. Pertinent negatives include no diaphoresis, dizziness, irregular heartbeat, nausea, near-syncope, palpitations or shortness of breath. Associated symptoms comments: Turning over in bed. She has tried NSAIDs for the symptoms. The treatment provided moderate relief.  Her past medical history is significant for cancer.    Lab Results  Component Value Date   CREATININE 1.17 (H) 04/15/2020   BUN 30 (H) 04/15/2020   NA 144 04/15/2020   K 3.8 04/15/2020   CL 100 04/15/2020   CO2 25 04/15/2020   Lab Results  Component Value Date   CHOL 159 04/15/2020   HDL 65 04/15/2020   LDLCALC 75 04/15/2020   TRIG 103 04/15/2020   CHOLHDL 3.0 01/17/2019   Lab Results  Component Value Date   TSH 2.170 04/15/2020   Lab Results  Component Value Date   HGBA1C 6.7 (H) 08/05/2020   No results found for: WBC, HGB, HCT, MCV, PLT Lab Results  Component Value Date   ALT 15 10/16/2019   AST 19 10/16/2019   ALKPHOS 105 10/16/2019   BILITOT 0.4 10/16/2019     Review of Systems  Constitutional: Negative for diaphoresis.  Respiratory: Negative for shortness of breath.   Cardiovascular: Positive for chest pain. Negative for palpitations and near-syncope.  Gastrointestinal: Negative for nausea.  Neurological: Negative for dizziness.    Patient Active Problem List   Diagnosis Date Noted  . Venous stasis dermatitis of left lower extremity 03/07/2019  . Venous  insufficiency of both lower extremities 03/07/2019  . Obesity (BMI 35.0-39.9 without comorbidity) 03/07/2019  . Type 2 diabetes mellitus without complication, without long-term current use of insulin ([place]) 01/17/2019  . Nontraumatic complete tear of right rotator cuff 12/13/2018  . Rotator cuff tendinitis, right 12/13/2018  . Genetic testing 08/29/2017  . Asthma, cough variant 05/29/2015  . 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 Chronic cough 10/31/2014  . Esophageal dysfunction 09/23/2014  . 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 BP (high blood pressure) 06/04/2013  . Dysphonia 03/01/2013  . Adult hypothyroidism 03/01/2013  . Post-tussive emesis 03/01/2013  . Malignant neoplasm of breast ([place]) 02/09/2013  . Acquired lymphedema 02/09/2013  . 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 Ace Inhibitors Cough    Per pt bp med caused cough possible ace inhibitor  . Sulfa Antibiotics Rash    Other Reaction: Not Assessed    Past Surgical History:  Procedure Laterality Date  . BIOPSY THYROID     benign  . BREAST LUMPECTOMY Right   . COLONOSCOPY  2006   repeat in 10 yrs- [facility] Dr  . KNEE ARTHROSCOPY WITH MENISCAL REPAIR Bilateral   . MASTECTOMY Left   . MASTECTOMY Right    2 yrs later  . MEDIASTINAL MASS EXCISION    . 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 TONSILLECTOMY    . tubal cauterization  Social History   Tobacco Use  . Smoking status: Never Smoker  . Smokeless tobacco: Never Used  . Tobacco comment: Smoking cessation materials not required  Vaping Use  . Vaping Use: Never used  Substance Use Topics  . Alcohol use: Yes    Alcohol/week: 0.0 standard drinks    Comment: socially 1-2 x month  . Drug use: No     Medication list has been reviewed and updated.  Current Meds  Medication Sig  . Accu-Chek Softclix Lancets lancets 100 each by Other route daily. Use as instructed/ Dx E11.9  . amitriptyline (ELAVIL)  10 MG tablet Take 1 tablet by mouth at bedtime as needed. Dr Rodolph Bong  . ammonium lactate (LAC-HYDRIN) 12 % lotion Apply 1 application topically 2 (two) times daily. Dr Nehemiah Massed  . aspirin EC 81 MG tablet Take 1 tablet by mouth daily.  Marland Kitchen atorvastatin (LIPITOR) 10 MG tablet TAKE 1 TABLET BY MOUTH EVERY DAY  . azelastine (ASTELIN) 0.1 % nasal spray Place 1 spray into the nose 2 (two) times daily. [facility]  . BD PEN NEEDLE NANO 2ND GEN 32G X 4 MM MISC ONE INJECTION DAILY  . benzonatate (TESSALON) 100 MG capsule TAKE 1 CAPSULE (100 MG TOTAL) BY MOUTH 2 (TWO) TIMES DAILY AS NEEDED  . Blood Glucose Monitoring Suppl (ACCU-CHEK GUIDE ME) w/Device KIT   . carbamide peroxide (DEBROX) 6.5 % OTIC solution Place 5 drops into both ears 2 (two) times daily. (Patient taking differently: Place 5 drops into both ears 2 (two) times daily as needed.)  . cetirizine-pseudoephedrine (ZYRTEC-D) 5-120 MG tablet Take 1 tablet by mouth daily. OTC  . clonazePAM (KLONOPIN) 0.5 MG tablet Take 0.5 mg by mouth daily. Otolaryn. DR Patrice Paradise  . glucose blood (ACCU-CHEK GUIDE) test strip SoftClix/ Dx E11.9  . hydrochlorothiazide (HYDRODIURIL) 25 MG tablet Take 1 tablet (25 mg total) by mouth daily.  Marland Kitchen levothyroxine (SYNTHROID) 50 MCG tablet Take 1 tablet (50 mcg total) by mouth daily.  Marland Kitchen liraglutide (VICTOZA) 18 MG/3ML SOPN Inject 0.1 mLs (0.6 mg total) into the skin daily.  . metFORMIN (GLUCOPHAGE-XR) 500 MG 24 hr tablet TAKE 1 TABLET BY MOUTH EVERY DAY WITH BREAKFAST  . montelukast (SINGULAIR) 10 MG tablet Take 1 tablet (10 mg total) by mouth at bedtime.  . Multiple Vitamin (MULTIVITAMIN WITH MINERALS) TABS tablet Take 1 tablet by mouth daily.  Marland Kitchen omeprazole (PRILOSEC) 40 MG capsule Take 1 capsule (40 mg total) by mouth daily.  . Probiotic Product (PROBIOTIC DAILY PO) Take 1 each by mouth daily. gummie  . SYMBICORT 160-4.5 MCG/ACT inhaler pulmonology  . tretinoin (RETIN-A) 0.025 % cream Apply a pea sized amount to the entire face  QHS.  Marland Kitchen Vitamin D, Cholecalciferol, 50 MCG (2000 UT) CAPS Take 1 capsule by mouth daily.    PHQ 2/9 Scores 12/03/2020 08/05/2020 04/15/2020 02/13/2020  PHQ - 2 Score 0 0 0 0  PHQ- 9 Score 0 0 0 -    GAD 7 : Generalized Anxiety Score 12/03/2020 08/05/2020 04/15/2020 01/21/2020  Nervous, Anxious, on Edge 0 0 0 0  Control/stop worrying 0 0 0 0  Worry too much - different things 0 0 0 0  Trouble relaxing 0 0 0 0  Restless 0 0 0 0  Easily annoyed or irritable 0 0 0 0  Afraid - awful might happen 0 0 0 0  Total GAD 7 Score 0 0 0 0    BP Readings from Last 3 Encounters:  12/03/20 120/70  08/05/20  120/72  04/15/20 (!) 138/80    Physical Exam  Wt Readings from Last 3 Encounters:  12/03/20 190 lb (86.2 kg)  08/05/20 186 lb (84.4 kg)  04/15/20 187 lb (84.8 kg)    BP 120/70   Pulse 68   Ht _0  (1.575 m)   Wt 190 lb (86.2 kg)   BMI 34.75 kg/m   Assessment and Plan: 1. Intercostal pain Patient presents today with chest pain of the left lateral chest wall.  Patient has had a history of breast cancer which was treated with mastectomy of the left breast.  Pain is along the incisional line and is felt up into the left shoulder area.  History is unremarkable for cardiac concerns however we did EKG to evaluate for cardiac concerns:I have reviewed EKG which shows normal sinus rhythm with a rate of 76.  Intervals are normal there is no LVH  met by voltage criteria.  There is no ischemic changes such as Q waves delayed R wave progression or ST segment concerns.  There is inverted T wave in V1 which is noted on EKG previous in 1921.Marland Kitchen Comparison to previous EKG dated 01/07/2020 no change in previous EKG. - EKG 12-Lead - DG Ribs Unilateral Left; Future - naproxen (NAPROSYN) 500 MG tablet; Take 1 tablet (500 mg total) by mouth 2 (two) times daily with a meal.  Dispense: 30 tablet; Refill: 0  2. Intercostal muscle strain, initial encounter There is tenderness in the intercostal space and rib noted  either side of the rib.  There is no palpable mass.  Auscultation of the area is unremarkable.  We will obtain a chest x-ray given that this is the area of incision to make sure that there is no osteolytic concern of the underlying rib.

## 2020-12-08 ENCOUNTER — Other Ambulatory Visit: Payer: Self-pay | Admitting: Family Medicine

## 2020-12-08 ENCOUNTER — Other Ambulatory Visit: Payer: Self-pay

## 2020-12-08 DIAGNOSIS — E119 Type 2 diabetes mellitus without complications: Secondary | ICD-10-CM

## 2020-12-08 MED ORDER — VICTOZA 18 MG/3ML ~~LOC~~ SOPN: PEN_INJECTOR | SUBCUTANEOUS | 0 refills | Status: DC

## 2020-12-17 ENCOUNTER — Other Ambulatory Visit: Payer: Self-pay

## 2020-12-17 ENCOUNTER — Encounter: Payer: Self-pay | Admitting: Dermatology

## 2020-12-17 ENCOUNTER — Ambulatory Visit: Payer: Medicare PPO | Admitting: Dermatology

## 2020-12-17 DIAGNOSIS — L820 Inflamed seborrheic keratosis: Secondary | ICD-10-CM

## 2020-12-17 NOTE — Progress Notes (Signed)
   Follow-Up Visit   Subjective  Linda Vazquez is a 75 y.o. female who presents for the following: check spot (Scalp, 4-5 months, itched in the beginning, has used mupirocin oint on it).  The following portions of the chart were reviewed this encounter and updated as appropriate:   Tobacco  Allergies  Meds  Problems  Med Hx  Surg Hx  Fam Hx     Review of Systems:  No other skin or systemic complaints except as noted in HPI or Assessment and Plan.  Objective  Well appearing patient in no apparent distress; mood and affect are within normal limits.  A focused examination was performed including scalp. Relevant physical exam findings are noted in the Assessment and Plan.  Objective  Mid Scalp x 1: Erythematous keratotic or waxy stuck-on papule or plaque.    Assessment & Plan  Inflamed seborrheic keratosis Mid Scalp x 1  Destruction of lesion - Mid Scalp x 1 Complexity: simple   Destruction method: cryotherapy   Informed consent: discussed and consent obtained   Timeout:  patient name, date of birth, surgical site, and procedure verified Lesion destroyed using liquid nitrogen: Yes   Region frozen until ice ball extended beyond lesion: Yes   Outcome: patient tolerated procedure well with no complications   Post-procedure details: wound care instructions given    Return for as scheduled for TBSE, rechec ISK.  I, Othelia Pulling, RMA, am acting as scribe for Sarina Ser, MD .  Documentation: I have reviewed the above documentation for accuracy and completeness, and I agree with the above.  Sarina Ser, MD

## 2020-12-17 NOTE — Patient Instructions (Addendum)
If you have any questions or concerns for your doctor, please call our main line at [phone] and press option 4 to reach your doctor's medical assistant. If no one answers, please leave a voicemail as directed and we will return your call as soon as possible. Messages left after 4 pm will be answered the following business day.   You may also send [place] a message via [place]. We typically respond to MyChart messages within 1-2 business days.  For prescription refills, please ask your pharmacy to contact our office. Our fax number is [phone].  If you have an urgent issue when the clinic is closed that cannot wait until the next business day, you can page your doctor at the number below.    Please note that while we do our best to be available for urgent issues outside of office hours, we are not available 24/7.   If you have an urgent issue and are unable to reach [place], you may choose to seek medical care at your doctor's office, retail clinic, urgent care center, or emergency room.  If you have a medical emergency, please immediately call 911 or go to the emergency department.  Pager Numbers  - Dr. Nehemiah Massed: [phone]  - Dr. Laurence Ferrari: [phone]  - Dr. Nicole Kindred: [phone]  In the event of inclement weather, please call our main line at [phone] for an update on the status of any delays or closures.  Dermatology Medication Tips: Please keep the boxes that topical medications come in in order to help keep track of the instructions about where and how to use these. Pharmacies typically print the medication instructions only on the boxes and not directly on the medication tubes.   If your medication is too expensive, please contact our office at [phone] option 4 or send [place] a message through [place].   We are unable to tell what your co-pay for medications will be in advance as this is different depending on your insurance coverage. However, we may be able to find a substitute  medication at lower cost or fill out paperwork to get insurance to cover a needed medication.   If a prior authorization is required to get your medication covered by your insurance company, please allow [place] 1-2 business days to complete this process.  Drug prices often vary depending on where the prescription is filled and some pharmacies may offer cheaper prices.  The website www.goodrx.com contains coupons for medications through different pharmacies. The prices here do not account for what the cost may be with help from insurance (it may be cheaper with your insurance), but the website can give you the price if you did not use any insurance.  - You can print the associated coupon and take it with your prescription to the pharmacy.  - You may also stop by our office during regular business hours and pick up a GoodRx coupon card.  - If you need your prescription sent electronically to a different pharmacy, notify our office through [facility] or by phone at [phone] option 4.  Seborrheic Keratosis  What causes seborrheic keratoses? Seborrheic keratoses are harmless, common skin growths that first appear during adult life.  As time goes by, more growths appear.  Some people may develop a large number of them.  Seborrheic keratoses appear on both covered and uncovered body parts.  They are not caused by sunlight.  The tendency to develop seborrheic keratoses can be inherited.  They vary in color from skin-colored to gray,  brown, or even black.  They can be either smooth or have a rough, warty surface.   Seborrheic keratoses are superficial and look as if they were stuck on the skin.  Under the microscope this type of keratosis looks like layers upon layers of skin.  That is why at times the top layer may seem to fall off, but the rest of the growth remains and re-grows.    Treatment Seborrheic keratoses do not need to be treated, but can easily be removed in the office.  Seborrheic  keratoses often cause symptoms when they rub on clothing or jewelry.  Lesions can be in the way of shaving.  If they become inflamed, they can cause itching, soreness, or burning.  Removal of a seborrheic keratosis can be accomplished by freezing, burning, or surgery. If any spot bleeds, scabs, or grows rapidly, please return to have it checked, as these can be an indication of a skin cancer.  Cryotherapy Aftercare  . Wash gently with soap and water everyday.   Marland Kitchen Apply Vaseline and Band-Aid daily until healed.

## 2020-12-23 ENCOUNTER — Other Ambulatory Visit: Payer: Self-pay

## 2020-12-23 ENCOUNTER — Ambulatory Visit: Payer: Medicare PPO | Admitting: Family Medicine

## 2020-12-23 ENCOUNTER — Encounter: Payer: Self-pay | Admitting: Family Medicine

## 2020-12-23 VITALS — BP 130/80 | HR 80 | Ht 62.0 in | Wt 190.0 lb

## 2020-12-23 DIAGNOSIS — E039 Hypothyroidism, unspecified: Secondary | ICD-10-CM

## 2020-12-23 DIAGNOSIS — E119 Type 2 diabetes mellitus without complications: Secondary | ICD-10-CM | POA: Diagnosis not present

## 2020-12-23 DIAGNOSIS — K219 Gastro-esophageal reflux disease without esophagitis: Secondary | ICD-10-CM

## 2020-12-23 DIAGNOSIS — I10 Essential (primary) hypertension: Secondary | ICD-10-CM

## 2020-12-23 DIAGNOSIS — E782 Mixed hyperlipidemia: Secondary | ICD-10-CM

## 2020-12-23 DIAGNOSIS — G629 Polyneuropathy, unspecified: Secondary | ICD-10-CM

## 2020-12-23 MED ORDER — ATORVASTATIN CALCIUM 10 MG PO TABS: 1.0000 | ORAL_TABLET | Freq: Every day | ORAL | 1 refills | Status: DC

## 2020-12-23 MED ORDER — METFORMIN HCL ER 500 MG PO TB24: ORAL_TABLET | ORAL | 1 refills | Status: DC

## 2020-12-23 MED ORDER — VICTOZA 18 MG/3ML ~~LOC~~ SOPN: PEN_INJECTOR | SUBCUTANEOUS | 1 refills | Status: DC

## 2020-12-23 MED ORDER — OMEPRAZOLE 40 MG PO CPDR: 40.0000 mg | DELAYED_RELEASE_CAPSULE | Freq: Every day | ORAL | 1 refills | Status: DC

## 2020-12-23 MED ORDER — HYDROCHLOROTHIAZIDE 25 MG PO TABS: 25.0000 mg | ORAL_TABLET | Freq: Every day | ORAL | 1 refills | Status: DC

## 2020-12-23 MED ORDER — LEVOTHYROXINE SODIUM 50 MCG PO TABS: 50.0000 ug | ORAL_TABLET | Freq: Every day | ORAL | 1 refills | Status: DC

## 2020-12-23 NOTE — Progress Notes (Signed)
Date:  12/23/2020   Name:  Linda Vazquez   DOB:  01-10-1946   MRN:  [id number]   Chief Complaint: Diabetes, Hyperlipidemia, Hypertension, Hypothyroidism, and Gastroesophageal Reflux  Diabetes She presents for her follow-up diabetic visit. She has type 2 diabetes mellitus. Her disease course has been stable. Pertinent negatives for hypoglycemia include no confusion, dizziness, headaches, nervousness/anxiousness or sweats. There are no diabetic associated symptoms. Pertinent negatives for diabetes include no blurred vision, no chest pain, no fatigue, no polydipsia, no polyuria, no visual change, no weakness and no weight loss. There are no hypoglycemic complications. Symptoms are stable. There are no diabetic complications. Pertinent negatives for diabetic complications include no CVA, PVD or retinopathy. Her weight is stable. She participates in exercise daily. Her breakfast blood glucose is taken between 8-9 am. Her breakfast blood glucose range is generally 110-130 mg/dl. An ACE inhibitor/angiotensin II receptor blocker is being taken.  Hyperlipidemia This is a chronic problem. The current episode started more than 1 year ago. The problem is controlled. Recent lipid tests were reviewed and are normal. She has no history of chronic renal disease, diabetes, hypothyroidism, liver disease, obesity or nephrotic syndrome. Pertinent negatives include no chest pain, focal sensory loss, focal weakness, myalgias or shortness of breath. Current antihyperlipidemic treatment includes statins. There are no compliance problems.   Hypertension This is a chronic problem. The problem has been waxing and waning since onset. The problem is controlled. Pertinent negatives include no anxiety, blurred vision, chest pain, headaches, malaise/fatigue, neck pain, orthopnea, palpitations, peripheral edema, PND, shortness of breath or sweats. There are no associated agents to hypertension. There are no known risk factors for  coronary artery disease. Past treatments include ACE inhibitors and lifestyle changes. The current treatment provides moderate improvement. There are no compliance problems.  There is no history of angina, kidney disease, CAD/MI, CVA, heart failure, left ventricular hypertrophy, PVD or retinopathy. Identifiable causes of hypertension include a thyroid problem. There is no history of chronic renal disease, a hypertension causing med or renovascular disease.  Gastroesophageal Reflux She reports no chest pain, no coughing, no dysphagia, no early satiety, no globus sensation, no heartburn, no sore throat or no stridor. This is a chronic problem. The problem occurs frequently. The problem has been gradually improving. The symptoms are aggravated by certain foods. Pertinent negatives include no anemia, fatigue, melena, muscle weakness, orthopnea or weight loss. She has tried a PPI for the symptoms. The treatment provided moderate relief. Past invasive treatments do not include gastroplasty.  Thyroid Problem Presents for follow-up visit. Patient reports no anxiety, cold intolerance, fatigue, palpitations, visual change or weight loss. The symptoms have been stable. Her past medical history is significant for hyperlipidemia. There is no history of diabetes or heart failure.  Neurologic Problem The patient's pertinent negatives include no altered mental status, clumsiness, focal sensory loss, focal weakness, loss of balance, memory loss, near-syncope, slurred speech, syncope, visual change or weakness. This is a chronic problem. The current episode started more than 1 year ago. The problem has been waxing and waning since onset. There was lower extremity focality noted. Pertinent negatives include no chest pain, confusion, dizziness, fatigue, headaches, neck pain, palpitations or shortness of breath. There is no history of liver disease.    Lab Results  Component Value Date   CREATININE 1.17 (H) 04/15/2020   BUN  30 (H) 04/15/2020   NA 144 04/15/2020   K 3.8 04/15/2020   CL 100 04/15/2020   CO2 25 04/15/2020  Lab Results  Component Value Date   CHOL 159 04/15/2020   HDL 65 04/15/2020   LDLCALC 75 04/15/2020   TRIG 103 04/15/2020   CHOLHDL 3.0 01/17/2019   Lab Results  Component Value Date   TSH 2.170 04/15/2020   Lab Results  Component Value Date   HGBA1C 6.7 (H) 08/05/2020   No results found for: WBC, HGB, HCT, MCV, PLT Lab Results  Component Value Date   ALT 15 10/16/2019   AST 19 10/16/2019   ALKPHOS 105 10/16/2019   BILITOT 0.4 10/16/2019     Review of Systems  Constitutional: Negative for fatigue, malaise/fatigue and weight loss.  HENT: Negative for sore throat.   Eyes: Negative for blurred vision.  Respiratory: Negative for cough and shortness of breath.   Cardiovascular: Negative for chest pain, palpitations, orthopnea, PND and near-syncope.  Gastrointestinal: Negative for dysphagia, heartburn and melena.  Endocrine: Negative for cold intolerance, polydipsia and polyuria.  Musculoskeletal: Negative for myalgias, muscle weakness and neck pain.  Neurological: Negative for dizziness, focal weakness, syncope, weakness, headaches and loss of balance.  Psychiatric/Behavioral: Negative for confusion and memory loss. The patient is not nervous/anxious.     Patient Active Problem List   Diagnosis Date Noted  . Venous stasis dermatitis of left lower extremity 03/07/2019  . Venous insufficiency of both lower extremities 03/07/2019  . Obesity (BMI 35.0-39.9 without comorbidity) 03/07/2019  . Type 2 diabetes mellitus without complication, without long-term current use of insulin ([place]) 01/17/2019  . Nontraumatic complete tear of right rotator cuff 12/13/2018  . Rotator cuff tendinitis, right 12/13/2018  . Genetic testing 08/29/2017  . Asthma, cough variant 05/29/2015  . 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 Chronic cough 10/31/2014  . Esophageal dysfunction 09/23/2014  .  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 BP (high blood pressure) 06/04/2013  . Dysphonia 03/01/2013  . Adult hypothyroidism 03/01/2013  . Post-tussive emesis 03/01/2013  . Malignant neoplasm of breast ([place]) 02/09/2013  . Acquired lymphedema 02/09/2013  . 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 Ace Inhibitors Cough    Per pt bp med caused cough possible ace inhibitor  . Sulfa Antibiotics Rash    Other Reaction: Not Assessed    Past Surgical History:  Procedure Laterality Date  . BIOPSY THYROID     benign  . BREAST LUMPECTOMY Right   . COLONOSCOPY  2006   repeat in 10 yrs- [facility] Dr  . KNEE ARTHROSCOPY WITH MENISCAL REPAIR Bilateral   . MASTECTOMY Left   . MASTECTOMY Right    2 yrs later  . MEDIASTINAL MASS EXCISION    . 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 TONSILLECTOMY    . tubal cauterization      Social History   Tobacco Use  . Smoking status: Never Smoker  . Smokeless tobacco: Never Used  . Tobacco comment: Smoking cessation materials not required  Vaping Use  . Vaping Use: Never used  Substance Use Topics  . Alcohol use: Yes    Alcohol/week: 0.0 standard drinks    Comment: socially 1-2 x month  . Drug use: No     Medication list has been reviewed and updated.  Current Meds  Medication Sig  . Accu-Chek Softclix Lancets lancets 100 each by Other route daily. Use as instructed/ Dx E11.9  . ammonium lactate (LAC-HYDRIN) 12 % lotion Apply 1 application topically 2 (two) times daily. Dr Nehemiah Massed  . ascorbic acid (  VITAMIN C) 250 MG CHEW Chew 500 mg by mouth daily.  Marland Kitchen aspirin EC 81 MG tablet Take 1 tablet by mouth daily.  Marland Kitchen atorvastatin (LIPITOR) 10 MG tablet TAKE 1 TABLET BY MOUTH EVERY DAY  . azelastine (ASTELIN) 0.1 % nasal spray Place 1 spray into the nose 2 (two) times daily. [facility]  . BD PEN NEEDLE NANO 2ND GEN 32G X 4 MM MISC ONE INJECTION DAILY  . benzonatate (TESSALON) 100 MG capsule TAKE 1  CAPSULE (100 MG TOTAL) BY MOUTH 2 (TWO) TIMES DAILY AS NEEDED  . Blood Glucose Monitoring Suppl (ACCU-CHEK GUIDE ME) w/Device KIT   . carbamide peroxide (DEBROX) 6.5 % OTIC solution Place 5 drops into both ears 2 (two) times daily. (Patient taking differently: Place 5 drops into both ears 2 (two) times daily as needed.)  . cetirizine-pseudoephedrine (ZYRTEC-D) 5-120 MG tablet Take 1 tablet by mouth daily. OTC  . clonazePAM (KLONOPIN) 0.5 MG tablet Take 0.5 mg by mouth daily. Otolaryn. DR Patrice Paradise  . glucose blood (ACCU-CHEK GUIDE) test strip SoftClix/ Dx E11.9  . hydrochlorothiazide (HYDRODIURIL) 25 MG tablet Take 1 tablet (25 mg total) by mouth daily.  Marland Kitchen levothyroxine (SYNTHROID) 50 MCG tablet Take 1 tablet (50 mcg total) by mouth daily.  Marland Kitchen liraglutide (VICTOZA) 18 MG/3ML SOPN INJECT 0.6 MG UNDER THE SKIN ONCE DAILY  . metFORMIN (GLUCOPHAGE-XR) 500 MG 24 hr tablet TAKE 1 TABLET BY MOUTH EVERY DAY WITH BREAKFAST  . Multiple Vitamin (MULTIVITAMIN WITH MINERALS) TABS tablet Take 1 tablet by mouth daily.  Marland Kitchen omeprazole (PRILOSEC) 40 MG capsule Take 1 capsule (40 mg total) by mouth daily.  . Probiotic Product (PROBIOTIC DAILY PO) Take 1 each by mouth daily. gummie  . SYMBICORT 160-4.5 MCG/ACT inhaler pulmonology  . tretinoin (RETIN-A) 0.025 % cream Apply a pea sized amount to the entire face QHS.  Marland Kitchen Vitamin D, Cholecalciferol, 50 MCG (2000 UT) CAPS Take 1 capsule by mouth daily.    PHQ 2/9 Scores 12/23/2020 12/03/2020 08/05/2020 04/15/2020  PHQ - 2 Score 0 0 0 0  PHQ- 9 Score 0 0 0 0    GAD 7 : Generalized Anxiety Score 12/23/2020 12/03/2020 08/05/2020 04/15/2020  Nervous, Anxious, on Edge 0 0 0 0  Control/stop worrying 0 0 0 0  Worry too much - different things 0 0 0 0  Trouble relaxing 0 0 0 0  Restless 0 0 0 0  Easily annoyed or irritable 0 0 0 0  Afraid - awful might happen 0 0 0 0  Total GAD 7 Score 0 0 0 0    BP Readings from Last 3 Encounters:  12/23/20 130/80  12/03/20 120/70  08/05/20  120/72    Physical Exam  Wt Readings from Last 3 Encounters:  12/23/20 190 lb (86.2 kg)  12/03/20 190 lb (86.2 kg)  08/05/20 186 lb (84.4 kg)    BP 130/80   Pulse 80   Ht _0  (1.575 m)   Wt 190 lb (86.2 kg)   BMI 34.75 kg/m   Assessment and Plan:  1. Type 2 diabetes mellitus without complication, without long-term current use of insulin ([facility]) Chronic.  Controlled.  Stable.  Patient's blood sugars in the morning fasting have been in acceptable range.  We will check an A1c microalbuminuria as well as CMP today.  We will likely continue Victoza 0.6 mg daily. - HgB A1c - Microalbumin, urine - Comprehensive Metabolic Panel (CMET) - liraglutide (VICTOZA) 18 MG/3ML SOPN; INJECT 0.6 MG UNDER THE SKIN  ONCE DAILY  Dispense: 9 mL; Refill: 1 - B12 and Folate Panel  2. Mixed hyperlipidemia Chronic.  Controlled.  Stable.  Continue atorvastatin 10 mg once a day.  Will check lipid panel. - Lipid Panel With LDL/HDL Ratio - atorvastatin (LIPITOR) 10 MG tablet; Take 1 tablet (10 mg total) by mouth daily.  Dispense: 90 tablet; Refill: 1  3. Essential hypertension Chronic.  Controlled.  Stable.  Blood pressure today is 130/80.  Continue hydrochlorothiazide 25 mg once a day. - Comprehensive Metabolic Panel (CMET) - hydrochlorothiazide (HYDRODIURIL) 25 MG tablet; Take 1 tablet (25 mg total) by mouth daily.  Dispense: 90 tablet; Refill: 1  4. Adult hypothyroidism Chronic.  Controlled.  Stable.  Continue levothyroxine at current dosing of 50 mcg daily.  Will check TSH for current status of control. - Thyroid Panel With TSH - levothyroxine (SYNTHROID) 50 MCG tablet; Take 1 tablet (50 mcg total) by mouth daily.  Dispense: 90 tablet; Refill: 1  5. Gastroesophageal reflux disease Chronic.  Controlled.  Stable.  Continue Prilosec 40 mg once a day. - omeprazole (PRILOSEC) 40 MG capsule; Take 1 capsule (40 mg total) by mouth daily.  Dispense: 90 capsule; Refill: 1  6. Neuropathy Relatively new  onset.  Patient has noted paresthesias in both ankles.  Even though most likely of a diabetic nature we will check a K32 and folic acid in the event there may be deficiency possibilities of X61 and/or folic acid. - Y70 and Folate Panel

## 2020-12-24 LAB — COMPREHENSIVE METABOLIC PANEL
ALT: 20 IU/L (ref 0–32)
AST: 27 IU/L (ref 0–40)
Albumin/Globulin Ratio: 1.6 (ref 1.2–2.2)
Albumin: 4.3 g/dL (ref 3.7–4.7)
Alkaline Phosphatase: 105 IU/L (ref 44–121)
BUN/Creatinine Ratio: 21 (ref 12–28)
BUN: 23 mg/dL (ref 8–27)
Bilirubin Total: 0.5 mg/dL (ref 0.0–1.2)
CO2: 23 mmol/L (ref 20–29)
Calcium: 10.3 mg/dL (ref 8.7–10.3)
Chloride: 101 mmol/L (ref 96–106)
Creatinine, Ser: 1.08 mg/dL — ABNORMAL HIGH (ref 0.57–1.00)
Globulin, Total: 2.7 g/dL (ref 1.5–4.5)
Glucose: 101 mg/dL — ABNORMAL HIGH (ref 65–99)
Potassium: 4.4 mmol/L (ref 3.5–5.2)
Sodium: 141 mmol/L (ref 134–144)
Total Protein: 7 g/dL (ref 6.0–8.5)
eGFR: 53 mL/min/{1.73_m2} — ABNORMAL LOW (ref >59)

## 2020-12-24 LAB — B12 AND FOLATE PANEL
Folate: >20 ng/mL (ref >3.0)
Vitamin B-12: 860 pg/mL (ref 232–1245)

## 2020-12-24 LAB — THYROID PANEL WITH TSH
Free Thyroxine Index: 3 (ref 1.2–4.9)
T3 Uptake Ratio: 28 % (ref 24–39)
T4, Total: 10.8 ug/dL (ref 4.5–12.0)
TSH: 2.23 u[IU]/mL (ref 0.450–4.500)

## 2020-12-24 LAB — LIPID PANEL WITH LDL/HDL RATIO
Cholesterol, Total: 159 mg/dL (ref 100–199)
HDL: 76 mg/dL (ref >39)
LDL Chol Calc (NIH): 67 mg/dL (ref 0–99)
LDL/HDL Ratio: 0.9 ratio (ref 0.0–3.2)
Triglycerides: 87 mg/dL (ref 0–149)
VLDL Cholesterol Cal: 16 mg/dL (ref 5–40)

## 2020-12-24 LAB — HEMOGLOBIN A1C
Est. average glucose Bld gHb Est-mCnc: 157 mg/dL
Hgb A1c MFr Bld: 7.1 % — ABNORMAL HIGH (ref 4.8–5.6)

## 2020-12-24 LAB — MICROALBUMIN, URINE: Microalbumin, Urine: 8.1 ug/mL

## 2021-01-01 ENCOUNTER — Other Ambulatory Visit: Payer: Self-pay | Admitting: Family Medicine

## 2021-01-01 DIAGNOSIS — R053 Chronic cough: Secondary | ICD-10-CM

## 2021-01-01 DIAGNOSIS — R490 Dysphonia: Secondary | ICD-10-CM

## 2021-01-14 DIAGNOSIS — J385 Laryngeal spasm: Secondary | ICD-10-CM | POA: Diagnosis not present

## 2021-01-23 ENCOUNTER — Other Ambulatory Visit: Payer: Self-pay | Admitting: Family Medicine

## 2021-01-23 DIAGNOSIS — E119 Type 2 diabetes mellitus without complications: Secondary | ICD-10-CM

## 2021-02-10 ENCOUNTER — Telehealth: Payer: Self-pay | Admitting: Family Medicine

## 2021-02-10 NOTE — Telephone Encounter (Signed)
Copied from [place] [phone]. Topic: Medicare AWV >> Feb 10, 2021  2:26 PM Weston Anna wrote: Reason for CRM: Left message for patient - AWVS on June 1,2022 has been rescheduled to June 6,2022 at [place] due [place] has a meeting

## 2021-02-18 ENCOUNTER — Ambulatory Visit: Payer: Medicare PPO

## 2021-02-23 ENCOUNTER — Ambulatory Visit (INDEPENDENT_AMBULATORY_CARE_PROVIDER_SITE_OTHER): Payer: Medicare PPO

## 2021-02-23 DIAGNOSIS — Z0000 Encounter for general adult medical examination without abnormal findings: Secondary | ICD-10-CM

## 2021-02-23 NOTE — Progress Notes (Signed)
Subjective:   Linda Vazquez is a 75 y.o. female who presents for Medicare Annual (Subsequent) preventive examination.  Virtual Visit via Telephone Note  I connected with  Linda Vazquez on 02/23/21 at  8:00 AM EDT by telephone and verified that I am speaking with the correct person using two identifiers.  Location: Patient: home Provider: [facility] Persons participating in the virtual visit: [place]   I discussed the limitations, risks, security and privacy concerns of performing an evaluation and management service by telephone and the availability of in person appointments. The patient expressed understanding and agreed to proceed.  Interactive audio and video telecommunications were attempted between this nurse and patient, however failed, due to patient having technical difficulties OR patient did not have access to video capability.  We continued and completed visit with audio only.  Some vital signs may be absent or patient reported.   Clemetine Marker, LPN    Review of Systems     Cardiac Risk Factors include: advanced age (>30mn, >>11women);diabetes mellitus;dyslipidemia;hypertension     Objective:    There were no vitals filed for this visit. There is no height or weight on file to calculate BMI.  Advanced Directives 02/23/2021 02/13/2020 02/07/2019 12/18/2018 02/06/2018 11/06/2015  Does Patient Have a Medical Advance Directive? _0  Yes  Type of AParamed[place]of [place]ern[place] will [place] will Living will;Healthcare Power of Attorney Living will;Healthcare Power of [place] will  Does patient want to make changes to medical advance directive? - - - No - Patient declined - -  Copy of [place] Chart? No - copy requested Yes - validated most recent copy scanned in chart (See row information) No -  copy requested - No - copy requested No - copy requested    Current Medications (verified) Outpatient Encounter Medications as of 02/23/2021  Medication Sig  . Accu-Chek Softclix Lancets lancets 100 each by Other route daily. Use as instructed/ Dx E11.9  . albuterol (VENTOLIN HFA) 108 (90 Base) MCG/ACT inhaler Inhale into the lungs.  .Marland Kitchenammonium lactate (LAC-HYDRIN) 12 % lotion Apply 1 application topically 2 (two) times daily. Dr KNehemiah Massed . ascorbic acid (VITAMIN C) 250 MG CHEW Chew 500 mg by mouth daily.  .Marland Kitchenaspirin EC 81 MG tablet Take 1 tablet by mouth daily.  .Marland Kitchenatorvastatin (LIPITOR) 10 MG tablet Take 1 tablet (10 mg total) by mouth daily.  .Marland Kitchenazelastine (ASTELIN) 0.1 % nasal spray Place 1 spray into the nose 2 (two) times daily. [facility]  . BD PEN NEEDLE NANO 2ND GEN 32G X 4 MM MISC ONE INJECTION DAILY  . benzonatate (TESSALON) 100 MG capsule TAKE 1 CAPSULE (100 MG TOTAL) BY MOUTH 2 (TWO) TIMES DAILY AS NEEDED  . Blood Glucose Monitoring Suppl (ACCU-CHEK GUIDE ME) w/Device KIT   . carbamide peroxide (DEBROX) 6.5 % OTIC solution Place 5 drops into both ears 2 (two) times daily. (Patient taking differently: Place 5 drops into both ears 2 (two) times daily as needed.)  . cetirizine-pseudoephedrine (ZYRTEC-D) 5-120 MG tablet Take 1 tablet by mouth daily. OTC  . clonazePAM (KLONOPIN) 0.5 MG tablet Take 0.5 mg by mouth daily. Otolaryn. DR CPatrice Paradise . glucose blood (ACCU-CHEK GUIDE) test strip USE TO TEST EVERY DAY  . hydrochlorothiazide (HYDRODIURIL) 25 MG tablet Take 1 tablet (25 mg total) by mouth daily.  .Marland Kitchenlevothyroxine (SYNTHROID) 50 MCG  tablet Take 1 tablet (50 mcg total) by mouth daily.  Marland Kitchen liraglutide (VICTOZA) 18 MG/3ML SOPN INJECT 0.6 MG UNDER THE SKIN ONCE DAILY  . metFORMIN (GLUCOPHAGE-XR) 500 MG 24 hr tablet TAKE 1 TABLET BY MOUTH EVERY DAY WITH BREAKFAST  . montelukast (SINGULAIR) 10 MG tablet Take 1 tablet (10 mg total) by mouth at bedtime.  . Multiple Vitamin (MULTIVITAMIN WITH  MINERALS) TABS tablet Take 1 tablet by mouth daily.  Marland Kitchen omeprazole (PRILOSEC) 40 MG capsule Take 1 capsule (40 mg total) by mouth daily.  Dellis Anes 160-4.5 MCG/ACT inhaler pulmonology  . tretinoin (RETIN-A) 0.025 % cream Apply a pea sized amount to the entire face QHS.  Marland Kitchen Vitamin D, Cholecalciferol, 50 MCG (2000 UT) CAPS Take 1 capsule by mouth daily.  Marland Kitchen amitriptyline (ELAVIL) 10 MG tablet Take 1 tablet by mouth at bedtime as needed. Dr Rodolph Bong (Patient not taking: No sig reported)  . [DISCONTINUED] naproxen (NAPROSYN) 500 MG tablet Take 1 tablet (500 mg total) by mouth 2 (two) times daily with a meal. (Patient not taking: Reported on 12/23/2020)  . [DISCONTINUED] Probiotic Product (PROBIOTIC DAILY PO) Take 1 each by mouth daily. gummie   No facility-administered encounter medications on file as of 02/23/2021.    Allergies (verified) Ace inhibitors and Sulfa antibiotics   History: Past Medical History:  Diagnosis Date  . Allergy   . Asthma   . Breast cancer ([place])   . Cancer ([place])   . Diabetes mellitus without complication ([place])   . GERD (gastroesophageal reflux disease)   . Hypertension   . Melanoma ([place]) 1976   L shoulder - WLE with lymph node biopsy - (negative)  . 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 Squamous cell carcinoma of skin 09/24/2020   right dorsum hand proximal to index finger MCP - [facility]IS, ED&C   . Thyroid disease   . Varix of lower extremity    Past Surgical History:  Procedure Laterality Date  . BIOPSY THYROID     benign  . BREAST LUMPECTOMY Right   . COLONOSCOPY  2006   repeat in 10 yrs- [facility] Dr  . KNEE ARTHROSCOPY WITH MENISCAL REPAIR Bilateral   . MASTECTOMY Left   . MASTECTOMY Right    2 yrs later  . MEDIASTINAL MASS EXCISION    . 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 TONSILLECTOMY    . tubal cauterization     Family History  Problem Relation Age of Onset  . Hypertension Mother   . Heart  disease Mother   . Heart disease Father   . Diabetes Brother   . Diabetes Paternal Uncle   . Diabetes Brother    Social History   Socioeconomic History  . Marital status: Married    Spouse name: Not on file  . Number of children: 0  . Years of education: Not on file  . Highest education level: Master's degree (e.g., MA, MS, MEng, MEd, MSW, MBA)  Occupational History  . Occupation: Retired  Tobacco Use  . Smoking status: Never Smoker  . Smokeless tobacco: Never Used  . Tobacco comment: Smoking cessation materials not required  Vaping Use  . Vaping Use: Never used  Substance and Sexual Activity  . Alcohol use: Yes    Alcohol/week: 0.0 standard drinks    Comment: socially 1-2 x month  . Drug use: No  . Sexual activity: Not Currently  Other Topics Concern  .  Not on file  Social History Narrative  . Not on file   Social Determinants of Health   Financial Resource Strain: Low Risk   . Difficulty of Paying Living Expenses: Not hard at all  Food Insecurity: No Food Insecurity  . Worried About Charity fundraiser in the Last Year: Never true  . Ran Out of Food in the Last Year: Never true  Transportation Needs: No Transportation Needs  . Lack of Transportation (Medical): No  . Lack of Transportation (Non-Medical): No  Physical Activity: Insufficiently Active  . Days of Exercise per Week: 1 day  . Minutes of Exercise per Session: 30 min  Stress: No Stress Concern Present  . Feeling of Stress : Not at all  Social Connections: Socially Integrated  . Frequency of Communication with Friends and Family: More than three times a week  . Frequency of Social Gatherings with Friends and Family: Three times a week  . Attends Religious Services: More than 4 times per year  . Active Member of Clubs or Organizations: Yes  . Attends Archivist Meetings: More than 4 times per year  . Marital Status: Married    Tobacco Counseling Counseling given: Not Answered Comment:  Smoking cessation materials not required   Clinical Intake:  Pre-visit preparation completed: Yes  Pain : No/denies pain     Nutritional Risks: Nausea/ vomitting/ diarrhea (diarrhea yesterday) Diabetes: Yes CBG done?: No Did pt. bring in CBG monitor from home?: No  How often do you need to have someone help you when you read instructions, pamphlets, or other written materials from your doctor or pharmacy?: 1 - Never  Nutrition Risk Assessment:  Has the patient had any N/V/D within the last 2 months?  Yes  Does the patient have any non-healing wounds?  No  Has the patient had any unintentional weight loss or weight gain?  No   Diabetes:  Is the patient diabetic?  Yes  If diabetic, was a CBG obtained today?  No  Did the patient bring in their glucometer from home?  No  How often do you monitor your CBG's? Once weekly.   Financial Strains and Diabetes Management:  Are you having any financial strains with the device, your supplies or your medication? No .  Does the patient want to be seen by Chronic Care Management for management of their diabetes?  No  Would the patient like to be referred to a Nutritionist or for Diabetic Management?  No   Diabetic Exams:  Diabetic Eye Exam: Completed 04/30/20 negative retinopathy.   Diabetic Foot Exam: Completed 04/15/20.    Interpreter Needed?: No  Information entered by :: Clemetine Marker LPN   Activities of Daily Living In your present state of health, do you have any difficulty performing the following activities: 02/23/2021  Hearing? N  Comment declines hearing aids  Vision? N  Difficulty concentrating or making decisions? N  Walking or climbing stairs? Y  Dressing or bathing? N  Doing errands, shopping? N  Preparing Food and eating ? N  Using the Toilet? N  In the past six months, have you accidently leaked urine? N  Do you have problems with loss of bowel control? N  Managing your Medications? N  Managing your Finances? N   Housekeeping or managing your Housekeeping? N  Some recent data might be hidden    Patient Care Team: Juline Patch, MD as PCP - General (Family Medicine) Ihor Dow, MD as Consulting Physician (Pulmonary Disease)  Jeanine Luz, MD as Consulting Physician (Otolaryngology) [place], [facility] Bradly Bienenstock, Monia Sabal, MD (Dermatology)  Indicate any recent Medical Services you may have received from other than [facility] providers in the past year (date may be approximate).     Assessment:   This is a routine wellness examination for Linda Vazquez.  Hearing/Vision screen  Hearing Screening   '125Hz'$  $Remo'250Hz'bOgRb$'500Hz'$'1000Hz'$'2000Hz'$'3000Hz'$'4000Hz'$'6000Hz'$'8000Hz'$   Right ear:           Left ear:           Comments: Pt denies hearing difficulty  Vision Screening Comments: Annual eye exams with Dr[facility] Glennon [facility] at [facility]  Dietary issues and exercise activities discussed: Current Exercise Habits: Home exercise routine, Type of exercise: walking, Time (Minutes): 30, Frequency (Times/Week): 1, Weekly Exercise (Minutes/Week): 30, Intensity: Mild, Exercise limited by: respiratory conditions(s)  Goals Addressed            This Visit's Progress   . DIET - INCREASE WATER INTAKE   On track    Recommend to drink at least 6-8 8oz glasses of water per day.      Depression Screen PHQ 2/9 Scores 02/23/2021 12/23/2020 12/03/2020 08/05/2020 04/15/2020 02/13/2020 01/21/2020  PHQ - 2 Score 0 0 0 0 0 0 0  PHQ- 9 Score - 0 0 0 0 - 0    Fall Risk Fall Risk  02/23/2021 08/05/2020 04/15/2020 02/13/2020 01/21/2020  Falls in the past year? 0 0 0 0 0  Comment - - - - -  Number falls in past yr: 0 - - 0 -  Injury with Fall? 0 - - 0 -  Risk for fall due to : No Fall Risks - - No Fall Risks -  Risk for fall due to: Comment - - - - -  Follow up Falls prevention discussed Falls evaluation completed Falls evaluation completed Falls prevention discussed Falls evaluation completed    FALL RISK PREVENTION  PERTAINING TO THE HOME:  Any stairs in or around the home? No  If so, are there any without handrails? No  Home free of loose throw rugs in walkways, pet beds, electrical cords, etc? Yes  Adequate lighting in your home to reduce risk of falls? Yes   ASSISTIVE DEVICES UTILIZED TO PREVENT FALLS:  Life alert? No  Use of a cane, walker or w/c? No  Grab bars in the bathroom? Yes  Shower chair or bench in shower? No  Elevated toilet seat or a handicapped toilet? No  TIMED UP AND GO:  Was the test performed? No . Telephonic visit.   Cognitive Function: Normal cognitive status assessed by direct observation by this Nurse Health Advisor. No abnormalities found.       6CIT Screen 02/07/2019 02/06/2018  What Year? 0 points 0 points  What month? 0 points 0 points  What time? 0 points 0 points  Count back from 20 0 points 0 points  Months in reverse 0 points 0 points  Repeat phrase 0 points 0 points  Total Score 0 0    Immunizations Immunization History  Administered Date(s) Administered  . Influenza, High Dose Seasonal PF 07/06/2017, 06/28/2018, 05/29/2019  . Influenza-Unspecified 05/21/2014, 06/24/2014, 06/03/2015, 07/06/2017, 05/22/2019, 06/02/2020  . Moderna Sars-Covid-2 Vaccination 10/05/2019, 11/02/2019, 08/05/2020, 01/22/2021  . Pneumococcal Conjugate-13 11/10/2016  . Pneumococcal Polysaccharide-23 01/16/2014  . Tdap 11/06/2015  . Zoster, Live 03/09/2012    TDAP status: Up to date  Flu Vaccine status: Up to date  Pneumococcal  vaccine status: Up to date  Covid-19 vaccine status: Completed vaccines  Qualifies for Shingles Vaccine? Yes   Zostavax completed Yes   Shingrix Completed?: No.    Education has been provided regarding the importance of this vaccine. Patient has been advised to call insurance company to determine out of pocket expense if they have not yet received this vaccine. Advised may also receive vaccine at local pharmacy or Health Dept. Verbalized acceptance  and understanding.  Screening Tests Health Maintenance  Topic Date Due  . Zoster Vaccines- Shingrix (1 of 2) Never done  . FOOT EXAM  04/15/2021  . INFLUENZA VACCINE  04/20/2021  . OPHTHALMOLOGY EXAM  04/30/2021  . HEMOGLOBIN A1C  06/24/2021  . URINE MICROALBUMIN  12/23/2021  . TETANUS/TDAP  11/05/2025  . Pneumococcal Vaccine 58-20 Years old  Completed  . DEXA SCAN  Completed  . COVID-19 Vaccine  Completed  . Hepatitis C Screening  Completed  . PNA vac Low Risk Adult  Completed  . HPV VACCINES  Aged Out  . MAMMOGRAM  Discontinued    Health Maintenance  Health Maintenance Due  Topic Date Due  . Zoster Vaccines- Shingrix (1 of 2) Never done    Colorectal cancer screening: Type of screening: Colonoscopy. Completed 04/22/16. Repeat every as directed years. No longer required due to age.   Mammogram status: No longer required due to bilateral mastectomy.  Bone Density status: Completed 11/11/15. Results reflect: Bone density results: NORMAL. Repeat every 2 years.  Lung Cancer Screening: (Low Dose CT Chest recommended if Age 44-80 years, 30 pack-year currently smoking OR have quit w/in 15years.) does not qualify.   Additional Screening:  Hepatitis C Screening: does qualify; Completed 10/17/17  Vision Screening: Recommended annual ophthalmology exams for early detection of glaucoma and other disorders of the eye. Is the patient up to date with their annual eye exam?  Yes  Who is the provider or what is the name of the office in which the patient attends annual eye exams? Dr. Glennon Mac.   Dental Screening: Recommended annual dental exams for proper oral hygiene  Community Resource Referral / Chronic Care Management: CRR required this visit?  No   CCM required this visit?  No      Plan:     I have personally reviewed and noted the following in the patient's chart:   . Medical and social history . Use of alcohol, tobacco or illicit drugs  . Current medications and  supplements including opioid prescriptions.  . Functional ability and status . Nutritional status . Physical activity . Advanced directives . List of other physicians . Hospitalizations, surgeries, and ER visits in previous 12 months . Vitals . Screenings to include cognitive, depression, and falls . Referrals and appointments  In addition, I have reviewed and discussed with patient certain preventive protocols, quality metrics, and best practice recommendations. A written personalized care plan for preventive services as well as general preventive health recommendations were provided to patient.     Clemetine Marker, LPN   10/23/9530   Nurse Notes: pt c/o episode of diarrhea yesterday after eating salad at the cutting board but feeling better today.

## 2021-02-23 NOTE — Patient Instructions (Signed)
Linda Vazquez , Thank you for taking time to come for your Medicare Wellness Visit. I appreciate your ongoing commitment to your health goals. Please review the following plan we discussed and let me know if I can assist you in the future.   Screening recommendations/referrals: Colonoscopy: no longer required Bone Density: done 11/11/15 Recommended yearly ophthalmology/optometry visit for glaucoma screening and checkup Recommended yearly dental visit for hygiene and checkup  Vaccinations: Influenza vaccine: done 06/02/20 Pneumococcal vaccine: done 11/10/16 Tdap vaccine: done 11/06/15 Shingles vaccine: Shingrix discussed. Please contact your pharmacy for coverage information.  Covid-19: done 10/05/19, 11/02/19, 08/05/20 & 01/22/21  Advanced directives: Please bring a copy of your health care power of attorney and living will to the office at your convenience.  Conditions/risks identified: Recommend increasing physical activity as tolerated  Next appointment: Follow up in one year for your annual wellness visit    Preventive Care 65 Years and Older, Female Preventive care refers to lifestyle choices and visits with your health care provider that can promote health and wellness. What does preventive care include?  A yearly physical exam. This is also called an annual well check.  Dental exams once or twice a year.  Routine eye exams. Ask your health care provider how often you should have your eyes checked.  Personal lifestyle choices, including:  Daily care of your teeth and gums.  Regular physical activity.  Eating a healthy diet.  Avoiding tobacco and drug use.  Limiting alcohol use.  Practicing safe sex.  Taking low-dose aspirin every day.  Taking vitamin and mineral supplements as recommended by your health care provider. What happens during an annual well check? The services and screenings done by your health care provider during your annual well check will depend on your  age, overall health, lifestyle risk factors, and family history of disease. Counseling  Your health care provider may ask you questions about your:  Alcohol use.  Tobacco use.  Drug use.  Emotional well-being.  Home and relationship well-being.  Sexual activity.  Eating habits.  History of falls.  Memory and ability to understand (cognition).  Work and work Statistician.  Reproductive health. Screening  You may have the following tests or measurements:  Height, weight, and BMI.  Blood pressure.  Lipid and cholesterol levels. These may be checked every 5 years, or more frequently if you are over 57 years old.  Skin check.  Lung cancer screening. You may have this screening every year starting at age 59 if you have a 30-pack-year history of smoking and currently smoke or have quit within the past 15 years.  Fecal occult blood test (FOBT) of the stool. You may have this test every year starting at age 52.  Flexible sigmoidoscopy or colonoscopy. You may have a sigmoidoscopy every 5 years or a colonoscopy every 10 years starting at age 2.  Hepatitis C blood test.  Hepatitis B blood test.  Sexually transmitted disease (STD) testing.  Diabetes screening. This is done by checking your blood sugar (glucose) after you have not eaten for a while (fasting). You may have this done every 1-3 years.  Bone density scan. This is done to screen for osteoporosis. You may have this done starting at age 24.  Mammogram. This may be done every 1-2 years. Talk to your health care provider about how often you should have regular mammograms. Talk with your health care provider about your test results, treatment options, and if necessary, the need for more tests. Vaccines  Your  health care provider may recommend certain vaccines, such as:  Influenza vaccine. This is recommended every year.  Tetanus, diphtheria, and acellular pertussis (Tdap, Td) vaccine. You may need a Td booster  every 10 years.  Zoster vaccine. You may need this after age 40.  Pneumococcal 13-valent conjugate (PCV13) vaccine. One dose is recommended after age 13.  Pneumococcal polysaccharide (PPSV23) vaccine. One dose is recommended after age 36. Talk to your health care provider about which screenings and vaccines you need and how often you need them. This information is not intended to replace advice given to you by your health care provider. Make sure you discuss any questions you have with your health care provider. Document Released: 10/03/2015 Document Revised: 05/26/2016 Document Reviewed: 07/08/2015 Elsevier Interactive Patient Education  2017 [employer] Prevention in the Home Falls can cause injuries. They can happen to people of all ages. There are many things you can do to make your home safe and to help prevent falls. What can I do on the outside of my home?  Regularly fix the edges of walkways and driveways and fix any cracks.  Remove anything that might make you trip as you walk through a door, such as a raised step or threshold.  Trim any bushes or trees on the path to your home.  Use bright outdoor lighting.  Clear any walking paths of anything that might make someone trip, such as rocks or tools.  Regularly check to see if handrails are loose or broken. Make sure that both sides of any steps have handrails.  Any raised decks and porches should have guardrails on the edges.  Have any leaves, snow, or ice cleared regularly.  Use sand or salt on walking paths during winter.  Clean up any spills in your garage right away. This includes oil or grease spills. What can I do in the bathroom?  Use night lights.  Install grab bars by the toilet and in the tub and shower. Do not use towel bars as grab bars.  Use non-skid mats or decals in the tub or shower.  If you need to sit down in the shower, use a plastic, non-slip stool.  Keep the floor dry. Clean up any  water that spills on the floor as soon as it happens.  Remove soap buildup in the tub or shower regularly.  Attach bath mats securely with double-sided non-slip rug tape.  Do not have throw rugs and other things on the floor that can make you trip. What can I do in the bedroom?  Use night lights.  Make sure that you have a light by your bed that is easy to reach.  Do not use any sheets or blankets that are too big for your bed. They should not hang down onto the floor.  Have a firm chair that has side arms. You can use this for support while you get dressed.  Do not have throw rugs and other things on the floor that can make you trip. What can I do in the kitchen?  Clean up any spills right away.  Avoid walking on wet floors.  Keep items that you use a lot in easy-to-reach places.  If you need to reach something above you, use a strong step stool that has a grab bar.  Keep electrical cords out of the way.  Do not use floor polish or wax that makes floors slippery. If you must use wax, use non-skid floor wax.  Do not  have throw rugs and other things on the floor that can make you trip. What can I do with my stairs?  Do not leave any items on the stairs.  Make sure that there are handrails on both sides of the stairs and use them. Fix handrails that are broken or loose. Make sure that handrails are as long as the stairways.  Check any carpeting to make sure that it is firmly attached to the stairs. Fix any carpet that is loose or worn.  Avoid having throw rugs at the top or bottom of the stairs. If you do have throw rugs, attach them to the floor with carpet tape.  Make sure that you have a light switch at the top of the stairs and the bottom of the stairs. If you do not have them, ask someone to add them for you. What else can I do to help prevent falls?  Wear shoes that:  Do not have high heels.  Have rubber bottoms.  Are comfortable and fit you well.  Are closed  at the toe. Do not wear sandals.  If you use a stepladder:  Make sure that it is fully opened. Do not climb a closed stepladder.  Make sure that both sides of the stepladder are locked into place.  Ask someone to hold it for you, if possible.  Clearly mark and make sure that you can see:  Any grab bars or handrails.  First and last steps.  Where the edge of each step is.  Use tools that help you move around (mobility aids) if they are needed. These include:  Canes.  Walkers.  Scooters.  Crutches.  Turn on the lights when you go into a dark area. Replace any light bulbs as soon as they burn out.  Set up your furniture so you have a clear path. Avoid moving your furniture around.  If any of your floors are uneven, fix them.  If there are any pets around you, be aware of where they are.  Review your medicines with your doctor. Some medicines can make you feel dizzy. This can increase your chance of falling. Ask your doctor what other things that you can do to help prevent falls. This information is not intended to replace advice given to you by your health care provider. Make sure you discuss any questions you have with your health care provider. Document Released: 07/03/2009 Document Revised: 02/12/2016 Document Reviewed: 10/11/2014 Elsevier Interactive Patient Education  2017 [employer].

## 2021-02-24 ENCOUNTER — Other Ambulatory Visit: Payer: Self-pay | Admitting: Family Medicine

## 2021-02-24 DIAGNOSIS — R490 Dysphonia: Secondary | ICD-10-CM

## 2021-02-24 DIAGNOSIS — R053 Chronic cough: Secondary | ICD-10-CM

## 2021-04-15 DIAGNOSIS — J45991 Cough variant asthma: Secondary | ICD-10-CM | POA: Diagnosis not present

## 2021-04-22 DIAGNOSIS — R1084 Generalized abdominal pain: Secondary | ICD-10-CM | POA: Diagnosis not present

## 2021-04-22 DIAGNOSIS — K5909 Other constipation: Secondary | ICD-10-CM | POA: Diagnosis not present

## 2021-04-22 DIAGNOSIS — R350 Frequency of micturition: Secondary | ICD-10-CM | POA: Diagnosis not present

## 2021-04-26 DIAGNOSIS — R1031 Right lower quadrant pain: Secondary | ICD-10-CM | POA: Diagnosis not present

## 2021-04-26 DIAGNOSIS — C787 Secondary malignant neoplasm of liver and intrahepatic bile duct: Secondary | ICD-10-CM | POA: Diagnosis not present

## 2021-04-26 DIAGNOSIS — K5900 Constipation, unspecified: Secondary | ICD-10-CM | POA: Diagnosis not present

## 2021-04-26 DIAGNOSIS — C7800 Secondary malignant neoplasm of unspecified lung: Secondary | ICD-10-CM | POA: Diagnosis not present

## 2021-04-26 DIAGNOSIS — I10 Essential (primary) hypertension: Secondary | ICD-10-CM | POA: Diagnosis not present

## 2021-04-26 DIAGNOSIS — C7801 Secondary malignant neoplasm of right lung: Secondary | ICD-10-CM | POA: Diagnosis not present

## 2021-04-26 DIAGNOSIS — M549 Dorsalgia, unspecified: Secondary | ICD-10-CM | POA: Diagnosis not present

## 2021-04-26 DIAGNOSIS — C7951 Secondary malignant neoplasm of bone: Secondary | ICD-10-CM | POA: Diagnosis not present

## 2021-04-26 DIAGNOSIS — Z20822 Contact with and (suspected) exposure to covid-19: Secondary | ICD-10-CM | POA: Diagnosis not present

## 2021-04-26 DIAGNOSIS — R599 Enlarged lymph nodes, unspecified: Secondary | ICD-10-CM | POA: Diagnosis not present

## 2021-04-26 LAB — CBC: RBC: 4.65 (ref 3.87–5.11)

## 2021-04-26 LAB — BASIC METABOLIC PANEL
BUN: 30 — AB (ref 4–21)
CO2: 28 — AB (ref 13–22)
Chloride: 102 (ref 99–108)
Creatinine: 1.4 — AB (ref 0.5–1.1)
Glucose: 122
Potassium: 4.6 (ref 3.4–5.3)
Sodium: 138 (ref 137–147)

## 2021-04-26 LAB — CBC AND DIFFERENTIAL
HCT: 39 (ref 36–46)
Hemoglobin: 12.8 (ref 12.0–16.0)
Platelets: 244 (ref 150–399)
WBC: 6.4

## 2021-04-26 LAB — COMPREHENSIVE METABOLIC PANEL
Albumin: 3.5 (ref 3.5–5.0)
Calcium: 9.7 (ref 8.7–10.7)

## 2021-04-26 LAB — HEPATIC FUNCTION PANEL
ALT: 20 (ref 7–35)
AST: 27 (ref 13–35)
Alkaline Phosphatase: 119 (ref 25–125)
Bilirubin, Total: 0.5

## 2021-04-27 ENCOUNTER — Other Ambulatory Visit: Payer: Self-pay

## 2021-04-27 ENCOUNTER — Encounter: Payer: Self-pay | Admitting: Family Medicine

## 2021-04-27 ENCOUNTER — Ambulatory Visit: Payer: Medicare PPO | Admitting: Family Medicine

## 2021-04-27 VITALS — BP 142/84 | HR 97 | Ht 62.0 in | Wt 186.0 lb

## 2021-04-27 DIAGNOSIS — E119 Type 2 diabetes mellitus without complications: Secondary | ICD-10-CM | POA: Diagnosis not present

## 2021-04-27 DIAGNOSIS — E782 Mixed hyperlipidemia: Secondary | ICD-10-CM

## 2021-04-27 DIAGNOSIS — C50912 Malignant neoplasm of unspecified site of left female breast: Secondary | ICD-10-CM | POA: Diagnosis not present

## 2021-04-27 DIAGNOSIS — C50911 Malignant neoplasm of unspecified site of right female breast: Secondary | ICD-10-CM | POA: Diagnosis not present

## 2021-04-27 DIAGNOSIS — Z170 Estrogen receptor positive status [ER+]: Secondary | ICD-10-CM

## 2021-04-27 MED ORDER — ACCU-CHEK SOFTCLIX LANCETS MISC: 100.0000 | Freq: Every day | 1 refills | Status: AC

## 2021-04-27 MED ORDER — ACCU-CHEK GUIDE VI STRP: ORAL_STRIP | 1 refills | Status: DC

## 2021-04-27 MED ORDER — VICTOZA 18 MG/3ML ~~LOC~~ SOPN: PEN_INJECTOR | SUBCUTANEOUS | 1 refills | Status: DC

## 2021-04-27 MED ORDER — BD PEN NEEDLE NANO 2ND GEN 32G X 4 MM MISC: 1 refills | Status: DC

## 2021-04-27 MED ORDER — ATORVASTATIN CALCIUM 10 MG PO TABS: 10.0000 mg | ORAL_TABLET | Freq: Every day | ORAL | 1 refills | Status: DC

## 2021-04-27 NOTE — Progress Notes (Signed)
Date:  04/27/2021   Name:  Linda Vazquez   DOB:  09-11-1946   MRN:  [id number]   Chief Complaint: No chief complaint on file.  Abdominal Pain This is a new problem. The current episode started in the past 7 days. The onset quality is sudden. The problem occurs constantly. The most recent episode lasted 3 weeks. The problem has been waxing and waning. The pain is located in the RLQ. The pain is at a severity of 7/10 (meds). The pain is severe. The quality of the pain is aching.   Lab Results  Component Value Date   CREATININE 1.08 (H) 12/23/2020   BUN 23 12/23/2020   NA 141 12/23/2020   K 4.4 12/23/2020   CL 101 12/23/2020   CO2 23 12/23/2020   Lab Results  Component Value Date   CHOL 159 12/23/2020   HDL 76 12/23/2020   LDLCALC 67 12/23/2020   TRIG 87 12/23/2020   CHOLHDL 3.0 01/17/2019   Lab Results  Component Value Date   TSH 2.230 12/23/2020   Lab Results  Component Value Date   HGBA1C 7.1 (H) 12/23/2020   No results found for: WBC, HGB, HCT, MCV, PLT Lab Results  Component Value Date   ALT 20 12/23/2020   AST 27 12/23/2020   ALKPHOS 105 12/23/2020   BILITOT 0.5 12/23/2020     Review of Systems  Gastrointestinal:  Positive for abdominal pain.   Patient Active Problem List   Diagnosis Date Noted   Chronic right shoulder pain 06/05/2020   Cervical radiculopathy 06/05/2020   Venous stasis dermatitis of left lower extremity 03/07/2019   Venous insufficiency of both lower extremities 03/07/2019   Body mass index (BMI) 33.0-33.9, adult 03/07/2019   Type 2 diabetes mellitus without complication, without long-term current use of insulin ([place]) 01/17/2019   Nontraumatic complete tear of right rotator cuff 12/13/2018   Rotator cuff tendinitis, right 12/13/2018   Genetic testing 08/29/2017   Asthma, cough variant 05/29/2015   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   Chronic cough 10/31/2014   Esophageal dysfunction 09/23/2014   Bony exostosis 12/07/2013   BP  (high blood pressure) 06/04/2013   Dysphonia 03/01/2013   Adult hypothyroidism 03/01/2013   Post-tussive emesis 03/01/2013   Malignant neoplasm of breast ([place]) 02/09/2013   Acquired lymphedema 02/09/2013   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    Allergies  Allergen Reactions   Ace Inhibitors Cough    Per pt bp med caused cough possible ace inhibitor   Sulfa Antibiotics Rash    Other Reaction: Not Assessed    Past Surgical History:  Procedure Laterality Date   BIOPSY THYROID     benign   BREAST LUMPECTOMY Right    COLONOSCOPY  2006   repeat in 10 yrs- [facility] Dr   KNEE ARTHROSCOPY WITH MENISCAL REPAIR Bilateral    MASTECTOMY Left    MASTECTOMY Right    2 yrs later   MEDIASTINAL MASS EXCISION     SKIN CANCER EXCISION     melanoma- stripped nodes on L) side   TONSILLECTOMY     tubal cauterization      Social History   Tobacco Use   Smoking status: Never   Smokeless tobacco: Never   Tobacco comments:    Smoking cessation materials not required  Vaping Use   Vaping Use: Never used  Substance Use Topics   Alcohol use: Yes    Alcohol/week: 0.0 standard drinks    Comment: socially 1-2 x month  Drug use: No     Medication list has been reviewed and updated.  Current Meds  Medication Sig   Accu-Chek Softclix Lancets lancets 100 each by Other route daily. Use as instructed/ Dx E11.9   albuterol (VENTOLIN HFA) 108 (90 Base) MCG/ACT inhaler Inhale into the lungs.   amitriptyline (ELAVIL) 10 MG tablet Take 1 tablet by mouth at bedtime as needed. Dr Rob Hickman Pulm   ammonium lactate (LAC-HYDRIN) 12 % lotion Apply 1 application topically 2 (two) times daily. Dr Nehemiah Massed   ascorbic acid (VITAMIN C) 250 MG CHEW Chew 500 mg by mouth daily.   aspirin EC 81 MG tablet Take 1 tablet by mouth daily.   atorvastatin (LIPITOR) 10 MG tablet Take 1 tablet (10 mg total) by mouth daily.   azelastine (ASTELIN) 0.1 % nasal spray Place 1 spray into the nose 2 (two) times daily. [facility]    BD PEN NEEDLE NANO 2ND GEN 32G X 4 MM MISC ONE INJECTION DAILY   benzonatate (TESSALON) 100 MG capsule TAKE 1 CAPSULE BY MOUTH 2 TIMES DAILY AS NEEDED.   Blood Glucose Monitoring Suppl (ACCU-CHEK GUIDE ME) w/Device KIT    carbamide peroxide (DEBROX) 6.5 % OTIC solution Place 5 drops into both ears 2 (two) times daily. (Patient taking differently: Place 5 drops into both ears 2 (two) times daily as needed.)   cetirizine-pseudoephedrine (ZYRTEC-D) 5-120 MG tablet Take 1 tablet by mouth daily. OTC   clonazePAM (KLONOPIN) 0.5 MG tablet Take 0.5 mg by mouth daily. Otolaryn. DR Patrice Paradise   glucose blood (ACCU-CHEK GUIDE) test strip USE TO TEST EVERY DAY   hydrochlorothiazide (HYDRODIURIL) 25 MG tablet Take 1 tablet (25 mg total) by mouth daily.   levothyroxine (SYNTHROID) 50 MCG tablet Take 1 tablet (50 mcg total) by mouth daily.   liraglutide (VICTOZA) 18 MG/3ML SOPN INJECT 0.6 MG UNDER THE SKIN ONCE DAILY   metFORMIN (GLUCOPHAGE-XR) 500 MG 24 hr tablet TAKE 1 TABLET BY MOUTH EVERY DAY WITH BREAKFAST   montelukast (SINGULAIR) 10 MG tablet Take 1 tablet (10 mg total) by mouth at bedtime.   Multiple Vitamin (MULTIVITAMIN WITH MINERALS) TABS tablet Take 1 tablet by mouth daily.   omeprazole (PRILOSEC) 40 MG capsule Take 1 capsule (40 mg total) by mouth daily.   SYMBICORT 160-4.5 MCG/ACT inhaler pulmonology   tretinoin (RETIN-A) 0.025 % cream Apply a pea sized amount to the entire face QHS.   Vitamin D, Cholecalciferol, 50 MCG (2000 UT) CAPS Take 1 capsule by mouth daily.    PHQ 2/9 Scores 02/23/2021 12/23/2020 12/03/2020 08/05/2020  PHQ - 2 Score 0 0 0 0  PHQ- 9 Score - 0 0 0    GAD 7 : Generalized Anxiety Score 12/23/2020 12/03/2020 08/05/2020 04/15/2020  Nervous, Anxious, on Edge 0 0 0 0  Control/stop worrying 0 0 0 0  Worry too much - different things 0 0 0 0  Trouble relaxing 0 0 0 0  Restless 0 0 0 0  Easily annoyed or irritable 0 0 0 0  Afraid - awful might happen 0 0 0 0  Total GAD 7 Score 0 0 0 0     BP Readings from Last 3 Encounters:  04/27/21 (!) 142/84  12/23/20 130/80  12/03/20 120/70    Physical Exam  Wt Readings from Last 3 Encounters:  04/27/21 186 lb (84.4 kg)  12/23/20 190 lb (86.2 kg)  12/03/20 190 lb (86.2 kg)    BP (!) 142/84   Pulse 97   Ht 5'  2" (1.575 m)   Wt 186 lb (84.4 kg)   SpO2 96%   BMI 34.02 kg/m   Assessment and Plan:  1. Bilateral malignant neoplasm of breast in female, estrogen receptor positive, unspecified site of breast [facility]) Previously noted and followed by [facility] oncology.  Recurrence with metastatic in multiple areas.  Noted in ER yesterday and needs appointment for likely biopsy/PET scan/preparation for upcoming procedures.  I discussed with nurse triage at [facility] and have requested Georjean Mode to help facilitate her evaluation. - Ambulatory referral to Gynecologic Oncology  2. Mixed hyperlipidemia .  Controlled.  Stable.  Refill atorvastatin 10 mg once a day. - atorvastatin (LIPITOR) 10 MG tablet; Take 1 tablet (10 mg total) by mouth daily.  Dispense: 90 tablet; Refill: 1  3. Type 2 diabetes mellitus without complication, without long-term current use of insulin ([facility]) Chronic.  Controlled.  Stable.  Refill Victoza as well as needles Accu-Chek test strips and lancets. - Insulin Pen Needle (BD PEN NEEDLE NANO 2ND GEN) 32G X 4 MM MISC; Use as directed  Dispense: 100 each; Refill: 1 - glucose blood (ACCU-CHEK GUIDE) test strip; USE TO TEST EVERY DAY  Dispense: 100 strip; Refill: 1 - liraglutide (VICTOZA) 18 MG/3ML SOPN; INJECT 0.6 MG UNDER THE SKIN ONCE DAILY  Dispense: 9 mL; Refill: 1 - Accu-Chek Softclix Lancets lancets; 100 each by Other route daily. Use as instructed/ Dx E11.9  Dispense: 100 each; Refill: 1

## 2021-04-28 ENCOUNTER — Ambulatory Visit: Payer: Medicare PPO | Admitting: Family Medicine

## 2021-04-28 ENCOUNTER — Telehealth: Payer: Self-pay

## 2021-04-28 ENCOUNTER — Telehealth: Payer: Self-pay | Admitting: Family Medicine

## 2021-04-28 NOTE — Telephone Encounter (Signed)
Linda Vazquez, from Dr. Charisse March office, calling stating that they received a referral for pt yesterday. She states that the pt is already being seen by an oncologist at [facility]. She states that the notes on referral stated that pt needs to follow up with oncologist and they aren't sure how it got to them. Linda Vazquez states that it also does not seem that pt needs to be seen for gynecologic issues either. Please advise.    [place]

## 2021-04-28 NOTE — Telephone Encounter (Signed)
Copied from [place] [id number]. Topic: General - Other >> Apr 28, 2021  2:39 PM Yvette Rack wrote: Reason for CRM: Pt requests that either Dr. Ronnald Ramp or Baxter Flattery call her back to discuss a personal matter. Cb# [phone]

## 2021-04-30 DIAGNOSIS — J90 Pleural effusion, not elsewhere classified: Secondary | ICD-10-CM | POA: Diagnosis not present

## 2021-04-30 DIAGNOSIS — R918 Other nonspecific abnormal finding of lung field: Secondary | ICD-10-CM | POA: Diagnosis not present

## 2021-04-30 DIAGNOSIS — R590 Localized enlarged lymph nodes: Secondary | ICD-10-CM | POA: Diagnosis not present

## 2021-04-30 DIAGNOSIS — C50912 Malignant neoplasm of unspecified site of left female breast: Secondary | ICD-10-CM | POA: Diagnosis not present

## 2021-04-30 DIAGNOSIS — C7951 Secondary malignant neoplasm of bone: Secondary | ICD-10-CM | POA: Diagnosis not present

## 2021-04-30 DIAGNOSIS — C50911 Malignant neoplasm of unspecified site of right female breast: Secondary | ICD-10-CM | POA: Diagnosis not present

## 2021-05-04 DIAGNOSIS — Z85820 Personal history of malignant melanoma of skin: Secondary | ICD-10-CM | POA: Diagnosis not present

## 2021-05-04 DIAGNOSIS — C7951 Secondary malignant neoplasm of bone: Secondary | ICD-10-CM | POA: Diagnosis not present

## 2021-05-04 DIAGNOSIS — C50912 Malignant neoplasm of unspecified site of left female breast: Secondary | ICD-10-CM | POA: Diagnosis not present

## 2021-05-04 DIAGNOSIS — Z171 Estrogen receptor negative status [ER-]: Secondary | ICD-10-CM | POA: Diagnosis not present

## 2021-05-04 DIAGNOSIS — Z853 Personal history of malignant neoplasm of breast: Secondary | ICD-10-CM | POA: Diagnosis not present

## 2021-05-13 ENCOUNTER — Encounter: Payer: Self-pay | Admitting: Dermatology

## 2021-05-13 ENCOUNTER — Ambulatory Visit: Payer: Medicare PPO | Admitting: Dermatology

## 2021-05-13 ENCOUNTER — Other Ambulatory Visit: Payer: Self-pay

## 2021-05-13 DIAGNOSIS — D1800 Hemangioma unspecified site: Secondary | ICD-10-CM

## 2021-05-13 DIAGNOSIS — Z85828 Personal history of other malignant neoplasm of skin: Secondary | ICD-10-CM

## 2021-05-13 DIAGNOSIS — L578 Other skin changes due to chronic exposure to nonionizing radiation: Secondary | ICD-10-CM | POA: Diagnosis not present

## 2021-05-13 DIAGNOSIS — L814 Other melanin hyperpigmentation: Secondary | ICD-10-CM

## 2021-05-13 DIAGNOSIS — S30870A Other superficial bite of lower back and pelvis, initial encounter: Secondary | ICD-10-CM | POA: Diagnosis not present

## 2021-05-13 DIAGNOSIS — Z85820 Personal history of malignant melanoma of skin: Secondary | ICD-10-CM | POA: Diagnosis not present

## 2021-05-13 DIAGNOSIS — C7981 Secondary malignant neoplasm of breast: Secondary | ICD-10-CM | POA: Diagnosis not present

## 2021-05-13 DIAGNOSIS — L817 Pigmented purpuric dermatosis: Secondary | ICD-10-CM | POA: Diagnosis not present

## 2021-05-13 DIAGNOSIS — C50919 Malignant neoplasm of unspecified site of unspecified female breast: Secondary | ICD-10-CM

## 2021-05-13 DIAGNOSIS — I872 Venous insufficiency (chronic) (peripheral): Secondary | ICD-10-CM | POA: Diagnosis not present

## 2021-05-13 DIAGNOSIS — L821 Other seborrheic keratosis: Secondary | ICD-10-CM

## 2021-05-13 DIAGNOSIS — D229 Melanocytic nevi, unspecified: Secondary | ICD-10-CM

## 2021-05-13 DIAGNOSIS — Z1283 Encounter for screening for malignant neoplasm of skin: Secondary | ICD-10-CM

## 2021-05-13 NOTE — Patient Instructions (Signed)

## 2021-05-13 NOTE — Progress Notes (Signed)
   Follow-Up Visit   Subjective  Linda Vazquez is a 75 y.o. female who presents for the following: Annual Exam (Hx MM, SCC - patient does have a bug bite on her buttocks that she covered with Neosporin and a bandage.). The patient presents for Total-Body Skin Exam (TBSE) for skin cancer screening and mole check.  The following portions of the chart were reviewed this encounter and updated as appropriate:   Tobacco  Allergies  Meds  Problems  Med Hx  Surg Hx  Fam Hx     Review of Systems:  No other skin or systemic complaints except as noted in HPI or Assessment and Plan.  Objective  Well appearing patient in no apparent distress; mood and affect are within normal limits.  A full examination was performed including scalp, head, eyes, ears, nose, lips, neck, chest, axillae, abdomen, back, buttocks, bilateral upper extremities, bilateral lower extremities, hands, feet, fingers, toes, fingernails, and toenails. All findings within normal limits unless otherwise noted below.   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   Stasis dermatitis of both legs with [place]'s purpura B/L leg Continue TMC to aa's BID PRN flares. Graduated compression stockings Erythematous, scaly patches involving the ankle and distal lower leg with associated lower leg edema.  Lentigines - Scattered tan macules - Due to sun exposure - Benign-appering, observe - Recommend daily broad spectrum sunscreen SPF 30+ to sun-exposed areas, reapply every 2 hours as needed. - Call for any changes  Seborrheic Keratoses - Stuck-on, waxy, tan-brown papules and/or plaques  - Benign-appearing - Discussed benign etiology and prognosis. - Observe - Call for any changes  Melanocytic Nevi - Tan-brown and/or pink-flesh-colored symmetric macules and papules - Benign appearing on exam today -  Observation - Call clinic for new or changing moles - Recommend daily use of broad spectrum spf 30+ sunscreen to sun-exposed areas.   Hemangiomas - Red papules - Discussed benign nature - Observe - Call for any changes  Actinic Damage - Chronic condition, secondary to cumulative UV/sun exposure - diffuse scaly erythematous macules with underlying dyspigmentation - Recommend daily broad spectrum sunscreen SPF 30+ to sun-exposed areas, reapply every 2 hours as needed.  - Staying in the shade or wearing long sleeves, sun glasses (UVA+UVB protection) and wide brim hats (4-inch brim around the entire circumference of the hat) are also recommended for sun protection.  - Call for new or changing lesions.  History of Melanoma - No evidence of recurrence today - No lymphadenopathy - Recommend regular full body skin exams - Recommend daily broad spectrum sunscreen SPF 30+ to sun-exposed areas, reapply every 2 hours as needed.  - Call if any new or changing lesions are noted between office visits  History of Squamous Cell Carcinoma of the Skin - No evidence of recurrence today - No lymphadenopathy - Recommend regular full body skin exams - Recommend daily broad spectrum sunscreen SPF 30+ to sun-exposed areas, reapply every 2 hours as needed.  - Call if any new or changing lesions are noted between office visits  Skin cancer screening performed today.  Return in about 1 year (around 05/13/2022) for TBSE.  Luther Redo, CMA, am acting as scribe for Sarina Ser, MD . Documentation: I have reviewed the above documentation for accuracy and completeness, and I agree with the above.  Sarina Ser, MD

## 2021-05-15 DIAGNOSIS — Z79899 Other long term (current) drug therapy: Secondary | ICD-10-CM | POA: Diagnosis not present

## 2021-05-15 DIAGNOSIS — C50911 Malignant neoplasm of unspecified site of right female breast: Secondary | ICD-10-CM | POA: Diagnosis not present

## 2021-05-15 DIAGNOSIS — C50912 Malignant neoplasm of unspecified site of left female breast: Secondary | ICD-10-CM | POA: Diagnosis not present

## 2021-05-15 DIAGNOSIS — J90 Pleural effusion, not elsewhere classified: Secondary | ICD-10-CM | POA: Diagnosis not present

## 2021-05-18 DIAGNOSIS — C50912 Malignant neoplasm of unspecified site of left female breast: Secondary | ICD-10-CM | POA: Diagnosis not present

## 2021-05-18 DIAGNOSIS — C50911 Malignant neoplasm of unspecified site of right female breast: Secondary | ICD-10-CM | POA: Diagnosis not present

## 2021-05-21 DIAGNOSIS — C50911 Malignant neoplasm of unspecified site of right female breast: Secondary | ICD-10-CM | POA: Diagnosis not present

## 2021-05-21 DIAGNOSIS — C7800 Secondary malignant neoplasm of unspecified lung: Secondary | ICD-10-CM | POA: Diagnosis not present

## 2021-05-21 DIAGNOSIS — C787 Secondary malignant neoplasm of liver and intrahepatic bile duct: Secondary | ICD-10-CM | POA: Diagnosis not present

## 2021-05-21 DIAGNOSIS — C50912 Malignant neoplasm of unspecified site of left female breast: Secondary | ICD-10-CM | POA: Diagnosis not present

## 2021-05-21 DIAGNOSIS — C7951 Secondary malignant neoplasm of bone: Secondary | ICD-10-CM | POA: Diagnosis not present

## 2021-05-27 DIAGNOSIS — E119 Type 2 diabetes mellitus without complications: Secondary | ICD-10-CM | POA: Diagnosis not present

## 2021-06-01 DIAGNOSIS — Z5112 Encounter for antineoplastic immunotherapy: Secondary | ICD-10-CM | POA: Diagnosis not present

## 2021-06-01 DIAGNOSIS — C7951 Secondary malignant neoplasm of bone: Secondary | ICD-10-CM | POA: Diagnosis not present

## 2021-06-01 DIAGNOSIS — E878 Other disorders of electrolyte and fluid balance, not elsewhere classified: Secondary | ICD-10-CM | POA: Diagnosis not present

## 2021-06-01 DIAGNOSIS — Z5111 Encounter for antineoplastic chemotherapy: Secondary | ICD-10-CM | POA: Diagnosis not present

## 2021-06-01 DIAGNOSIS — C787 Secondary malignant neoplasm of liver and intrahepatic bile duct: Secondary | ICD-10-CM | POA: Diagnosis not present

## 2021-06-01 DIAGNOSIS — Z170 Estrogen receptor positive status [ER+]: Secondary | ICD-10-CM | POA: Diagnosis not present

## 2021-06-01 DIAGNOSIS — E876 Hypokalemia: Secondary | ICD-10-CM | POA: Insufficient documentation

## 2021-06-01 DIAGNOSIS — K5903 Drug induced constipation: Secondary | ICD-10-CM | POA: Diagnosis not present

## 2021-06-01 DIAGNOSIS — C7800 Secondary malignant neoplasm of unspecified lung: Secondary | ICD-10-CM | POA: Diagnosis not present

## 2021-06-01 DIAGNOSIS — C50911 Malignant neoplasm of unspecified site of right female breast: Secondary | ICD-10-CM | POA: Diagnosis not present

## 2021-06-01 DIAGNOSIS — C50912 Malignant neoplasm of unspecified site of left female breast: Secondary | ICD-10-CM | POA: Diagnosis not present

## 2021-06-08 DIAGNOSIS — C50919 Malignant neoplasm of unspecified site of unspecified female breast: Secondary | ICD-10-CM | POA: Diagnosis not present

## 2021-06-08 DIAGNOSIS — Z170 Estrogen receptor positive status [ER+]: Secondary | ICD-10-CM | POA: Diagnosis not present

## 2021-06-08 DIAGNOSIS — C50912 Malignant neoplasm of unspecified site of left female breast: Secondary | ICD-10-CM | POA: Diagnosis not present

## 2021-06-08 DIAGNOSIS — C50911 Malignant neoplasm of unspecified site of right female breast: Secondary | ICD-10-CM | POA: Diagnosis not present

## 2021-06-08 DIAGNOSIS — C7951 Secondary malignant neoplasm of bone: Secondary | ICD-10-CM | POA: Diagnosis not present

## 2021-06-10 DIAGNOSIS — M48061 Spinal stenosis, lumbar region without neurogenic claudication: Secondary | ICD-10-CM | POA: Diagnosis not present

## 2021-06-10 DIAGNOSIS — C50919 Malignant neoplasm of unspecified site of unspecified female breast: Secondary | ICD-10-CM | POA: Diagnosis not present

## 2021-06-10 DIAGNOSIS — M47812 Spondylosis without myelopathy or radiculopathy, cervical region: Secondary | ICD-10-CM | POA: Diagnosis not present

## 2021-06-10 DIAGNOSIS — M47816 Spondylosis without myelopathy or radiculopathy, lumbar region: Secondary | ICD-10-CM | POA: Diagnosis not present

## 2021-06-10 DIAGNOSIS — C787 Secondary malignant neoplasm of liver and intrahepatic bile duct: Secondary | ICD-10-CM | POA: Diagnosis not present

## 2021-06-10 DIAGNOSIS — Z79899 Other long term (current) drug therapy: Secondary | ICD-10-CM | POA: Diagnosis not present

## 2021-06-10 DIAGNOSIS — C7951 Secondary malignant neoplasm of bone: Secondary | ICD-10-CM | POA: Diagnosis not present

## 2021-06-10 DIAGNOSIS — Z5111 Encounter for antineoplastic chemotherapy: Secondary | ICD-10-CM | POA: Diagnosis not present

## 2021-06-17 DIAGNOSIS — C50912 Malignant neoplasm of unspecified site of left female breast: Secondary | ICD-10-CM | POA: Diagnosis not present

## 2021-06-17 DIAGNOSIS — Z5111 Encounter for antineoplastic chemotherapy: Secondary | ICD-10-CM | POA: Diagnosis not present

## 2021-06-19 ENCOUNTER — Other Ambulatory Visit: Payer: Self-pay | Admitting: Family Medicine

## 2021-06-19 DIAGNOSIS — I10 Essential (primary) hypertension: Secondary | ICD-10-CM

## 2021-06-24 DIAGNOSIS — C7800 Secondary malignant neoplasm of unspecified lung: Secondary | ICD-10-CM | POA: Diagnosis not present

## 2021-06-24 DIAGNOSIS — R197 Diarrhea, unspecified: Secondary | ICD-10-CM | POA: Diagnosis not present

## 2021-06-24 DIAGNOSIS — C7951 Secondary malignant neoplasm of bone: Secondary | ICD-10-CM | POA: Diagnosis not present

## 2021-06-24 DIAGNOSIS — E876 Hypokalemia: Secondary | ICD-10-CM | POA: Diagnosis not present

## 2021-06-24 DIAGNOSIS — C50912 Malignant neoplasm of unspecified site of left female breast: Secondary | ICD-10-CM | POA: Diagnosis not present

## 2021-06-24 DIAGNOSIS — K521 Toxic gastroenteritis and colitis: Secondary | ICD-10-CM | POA: Diagnosis not present

## 2021-06-24 DIAGNOSIS — K224 Dyskinesia of esophagus: Secondary | ICD-10-CM | POA: Diagnosis not present

## 2021-06-24 DIAGNOSIS — C50919 Malignant neoplasm of unspecified site of unspecified female breast: Secondary | ICD-10-CM | POA: Diagnosis not present

## 2021-06-24 DIAGNOSIS — Z5112 Encounter for antineoplastic immunotherapy: Secondary | ICD-10-CM | POA: Diagnosis not present

## 2021-06-24 DIAGNOSIS — M545 Low back pain, unspecified: Secondary | ICD-10-CM | POA: Diagnosis not present

## 2021-06-24 DIAGNOSIS — Z5111 Encounter for antineoplastic chemotherapy: Secondary | ICD-10-CM | POA: Diagnosis not present

## 2021-06-24 DIAGNOSIS — C787 Secondary malignant neoplasm of liver and intrahepatic bile duct: Secondary | ICD-10-CM | POA: Diagnosis not present

## 2021-06-29 DIAGNOSIS — C7951 Secondary malignant neoplasm of bone: Secondary | ICD-10-CM | POA: Diagnosis not present

## 2021-06-29 DIAGNOSIS — C50919 Malignant neoplasm of unspecified site of unspecified female breast: Secondary | ICD-10-CM | POA: Diagnosis not present

## 2021-06-29 DIAGNOSIS — C50911 Malignant neoplasm of unspecified site of right female breast: Secondary | ICD-10-CM | POA: Diagnosis not present

## 2021-07-03 DIAGNOSIS — R112 Nausea with vomiting, unspecified: Secondary | ICD-10-CM | POA: Diagnosis not present

## 2021-07-03 DIAGNOSIS — Z79899 Other long term (current) drug therapy: Secondary | ICD-10-CM | POA: Diagnosis not present

## 2021-07-03 DIAGNOSIS — Z7189 Other specified counseling: Secondary | ICD-10-CM | POA: Diagnosis not present

## 2021-07-03 DIAGNOSIS — C50919 Malignant neoplasm of unspecified site of unspecified female breast: Secondary | ICD-10-CM | POA: Diagnosis not present

## 2021-07-03 DIAGNOSIS — Z515 Encounter for palliative care: Secondary | ICD-10-CM | POA: Diagnosis not present

## 2021-07-03 DIAGNOSIS — R630 Anorexia: Secondary | ICD-10-CM | POA: Diagnosis not present

## 2021-07-03 DIAGNOSIS — R110 Nausea: Secondary | ICD-10-CM | POA: Diagnosis not present

## 2021-07-03 DIAGNOSIS — C50911 Malignant neoplasm of unspecified site of right female breast: Secondary | ICD-10-CM | POA: Diagnosis not present

## 2021-07-03 DIAGNOSIS — C7951 Secondary malignant neoplasm of bone: Secondary | ICD-10-CM | POA: Diagnosis not present

## 2021-07-03 DIAGNOSIS — G893 Neoplasm related pain (acute) (chronic): Secondary | ICD-10-CM | POA: Diagnosis not present

## 2021-07-06 DIAGNOSIS — C7951 Secondary malignant neoplasm of bone: Secondary | ICD-10-CM | POA: Diagnosis not present

## 2021-07-07 DIAGNOSIS — C7951 Secondary malignant neoplasm of bone: Secondary | ICD-10-CM | POA: Diagnosis not present

## 2021-07-08 DIAGNOSIS — Z510 Encounter for antineoplastic radiation therapy: Secondary | ICD-10-CM | POA: Diagnosis not present

## 2021-07-08 DIAGNOSIS — C50912 Malignant neoplasm of unspecified site of left female breast: Secondary | ICD-10-CM | POA: Diagnosis not present

## 2021-07-08 DIAGNOSIS — C7951 Secondary malignant neoplasm of bone: Secondary | ICD-10-CM | POA: Diagnosis not present

## 2021-07-09 DIAGNOSIS — C7951 Secondary malignant neoplasm of bone: Secondary | ICD-10-CM | POA: Diagnosis not present

## 2021-07-10 DIAGNOSIS — C7951 Secondary malignant neoplasm of bone: Secondary | ICD-10-CM | POA: Diagnosis not present

## 2021-07-12 ENCOUNTER — Other Ambulatory Visit: Payer: Self-pay | Admitting: Family Medicine

## 2021-07-12 DIAGNOSIS — K219 Gastro-esophageal reflux disease without esophagitis: Secondary | ICD-10-CM

## 2021-07-12 DIAGNOSIS — K521 Toxic gastroenteritis and colitis: Secondary | ICD-10-CM | POA: Insufficient documentation

## 2021-07-15 DIAGNOSIS — Z5111 Encounter for antineoplastic chemotherapy: Secondary | ICD-10-CM | POA: Diagnosis not present

## 2021-07-15 DIAGNOSIS — I517 Cardiomegaly: Secondary | ICD-10-CM | POA: Diagnosis not present

## 2021-07-15 DIAGNOSIS — R197 Diarrhea, unspecified: Secondary | ICD-10-CM | POA: Diagnosis not present

## 2021-07-15 DIAGNOSIS — E878 Other disorders of electrolyte and fluid balance, not elsewhere classified: Secondary | ICD-10-CM | POA: Diagnosis not present

## 2021-07-15 DIAGNOSIS — E876 Hypokalemia: Secondary | ICD-10-CM | POA: Diagnosis not present

## 2021-07-15 DIAGNOSIS — C7800 Secondary malignant neoplasm of unspecified lung: Secondary | ICD-10-CM | POA: Diagnosis not present

## 2021-07-15 DIAGNOSIS — C50911 Malignant neoplasm of unspecified site of right female breast: Secondary | ICD-10-CM | POA: Diagnosis not present

## 2021-07-15 DIAGNOSIS — Z5112 Encounter for antineoplastic immunotherapy: Secondary | ICD-10-CM | POA: Diagnosis not present

## 2021-07-15 DIAGNOSIS — C787 Secondary malignant neoplasm of liver and intrahepatic bile duct: Secondary | ICD-10-CM | POA: Diagnosis not present

## 2021-07-15 DIAGNOSIS — C7951 Secondary malignant neoplasm of bone: Secondary | ICD-10-CM | POA: Diagnosis not present

## 2021-07-22 DIAGNOSIS — E876 Hypokalemia: Secondary | ICD-10-CM | POA: Diagnosis not present

## 2021-07-22 DIAGNOSIS — C50912 Malignant neoplasm of unspecified site of left female breast: Secondary | ICD-10-CM | POA: Diagnosis not present

## 2021-07-22 DIAGNOSIS — Z5111 Encounter for antineoplastic chemotherapy: Secondary | ICD-10-CM | POA: Diagnosis not present

## 2021-07-28 DIAGNOSIS — C50912 Malignant neoplasm of unspecified site of left female breast: Secondary | ICD-10-CM | POA: Diagnosis not present

## 2021-07-28 DIAGNOSIS — K529 Noninfective gastroenteritis and colitis, unspecified: Secondary | ICD-10-CM | POA: Diagnosis not present

## 2021-07-28 DIAGNOSIS — K521 Toxic gastroenteritis and colitis: Secondary | ICD-10-CM | POA: Diagnosis not present

## 2021-07-28 DIAGNOSIS — C50911 Malignant neoplasm of unspecified site of right female breast: Secondary | ICD-10-CM | POA: Diagnosis not present

## 2021-07-28 DIAGNOSIS — I517 Cardiomegaly: Secondary | ICD-10-CM | POA: Diagnosis not present

## 2021-07-28 DIAGNOSIS — C799 Secondary malignant neoplasm of unspecified site: Secondary | ICD-10-CM | POA: Diagnosis not present

## 2021-07-28 DIAGNOSIS — Z79899 Other long term (current) drug therapy: Secondary | ICD-10-CM | POA: Diagnosis not present

## 2021-07-28 DIAGNOSIS — T451X5D Adverse effect of antineoplastic and immunosuppressive drugs, subsequent encounter: Secondary | ICD-10-CM | POA: Diagnosis not present

## 2021-07-28 DIAGNOSIS — R112 Nausea with vomiting, unspecified: Secondary | ICD-10-CM | POA: Diagnosis not present

## 2021-07-28 DIAGNOSIS — R197 Diarrhea, unspecified: Secondary | ICD-10-CM | POA: Diagnosis not present

## 2021-07-28 DIAGNOSIS — E876 Hypokalemia: Secondary | ICD-10-CM | POA: Diagnosis not present

## 2021-07-29 DIAGNOSIS — E878 Other disorders of electrolyte and fluid balance, not elsewhere classified: Secondary | ICD-10-CM | POA: Diagnosis not present

## 2021-07-29 DIAGNOSIS — C7951 Secondary malignant neoplasm of bone: Secondary | ICD-10-CM | POA: Diagnosis not present

## 2021-07-29 DIAGNOSIS — R112 Nausea with vomiting, unspecified: Secondary | ICD-10-CM | POA: Diagnosis not present

## 2021-07-29 DIAGNOSIS — C50919 Malignant neoplasm of unspecified site of unspecified female breast: Secondary | ICD-10-CM | POA: Diagnosis not present

## 2021-07-29 DIAGNOSIS — C50911 Malignant neoplasm of unspecified site of right female breast: Secondary | ICD-10-CM | POA: Diagnosis not present

## 2021-07-29 DIAGNOSIS — Z9221 Personal history of antineoplastic chemotherapy: Secondary | ICD-10-CM | POA: Diagnosis not present

## 2021-07-29 DIAGNOSIS — Z923 Personal history of irradiation: Secondary | ICD-10-CM | POA: Diagnosis not present

## 2021-07-29 DIAGNOSIS — C787 Secondary malignant neoplasm of liver and intrahepatic bile duct: Secondary | ICD-10-CM | POA: Diagnosis not present

## 2021-07-29 DIAGNOSIS — K521 Toxic gastroenteritis and colitis: Secondary | ICD-10-CM | POA: Diagnosis not present

## 2021-07-29 DIAGNOSIS — T451X5A Adverse effect of antineoplastic and immunosuppressive drugs, initial encounter: Secondary | ICD-10-CM | POA: Diagnosis not present

## 2021-08-05 DIAGNOSIS — C50911 Malignant neoplasm of unspecified site of right female breast: Secondary | ICD-10-CM | POA: Diagnosis not present

## 2021-08-05 DIAGNOSIS — Z5112 Encounter for antineoplastic immunotherapy: Secondary | ICD-10-CM | POA: Diagnosis not present

## 2021-08-05 DIAGNOSIS — T451X5D Adverse effect of antineoplastic and immunosuppressive drugs, subsequent encounter: Secondary | ICD-10-CM | POA: Diagnosis not present

## 2021-08-05 DIAGNOSIS — Z79899 Other long term (current) drug therapy: Secondary | ICD-10-CM | POA: Diagnosis not present

## 2021-08-05 DIAGNOSIS — C787 Secondary malignant neoplasm of liver and intrahepatic bile duct: Secondary | ICD-10-CM | POA: Diagnosis not present

## 2021-08-05 DIAGNOSIS — Z5111 Encounter for antineoplastic chemotherapy: Secondary | ICD-10-CM | POA: Diagnosis not present

## 2021-08-05 DIAGNOSIS — C7800 Secondary malignant neoplasm of unspecified lung: Secondary | ICD-10-CM | POA: Diagnosis not present

## 2021-08-05 DIAGNOSIS — Z171 Estrogen receptor negative status [ER-]: Secondary | ICD-10-CM | POA: Diagnosis not present

## 2021-08-05 DIAGNOSIS — K521 Toxic gastroenteritis and colitis: Secondary | ICD-10-CM | POA: Diagnosis not present

## 2021-08-05 DIAGNOSIS — C50912 Malignant neoplasm of unspecified site of left female breast: Secondary | ICD-10-CM | POA: Diagnosis not present

## 2021-08-05 DIAGNOSIS — C7951 Secondary malignant neoplasm of bone: Secondary | ICD-10-CM | POA: Diagnosis not present

## 2021-08-12 DIAGNOSIS — C50912 Malignant neoplasm of unspecified site of left female breast: Secondary | ICD-10-CM | POA: Diagnosis not present

## 2021-08-12 DIAGNOSIS — Z5111 Encounter for antineoplastic chemotherapy: Secondary | ICD-10-CM | POA: Diagnosis not present

## 2021-08-18 DIAGNOSIS — R197 Diarrhea, unspecified: Secondary | ICD-10-CM | POA: Diagnosis not present

## 2021-08-18 DIAGNOSIS — R9431 Abnormal electrocardiogram [ECG] [EKG]: Secondary | ICD-10-CM | POA: Diagnosis not present

## 2021-08-18 DIAGNOSIS — C50919 Malignant neoplasm of unspecified site of unspecified female breast: Secondary | ICD-10-CM | POA: Diagnosis not present

## 2021-08-18 DIAGNOSIS — I214 Non-ST elevation (NSTEMI) myocardial infarction: Secondary | ICD-10-CM | POA: Diagnosis not present

## 2021-08-18 DIAGNOSIS — E876 Hypokalemia: Secondary | ICD-10-CM | POA: Diagnosis not present

## 2021-08-18 DIAGNOSIS — D649 Anemia, unspecified: Secondary | ICD-10-CM | POA: Diagnosis not present

## 2021-08-18 DIAGNOSIS — C50911 Malignant neoplasm of unspecified site of right female breast: Secondary | ICD-10-CM | POA: Diagnosis not present

## 2021-08-18 DIAGNOSIS — K529 Noninfective gastroenteritis and colitis, unspecified: Secondary | ICD-10-CM | POA: Diagnosis not present

## 2021-08-18 DIAGNOSIS — E878 Other disorders of electrolyte and fluid balance, not elsewhere classified: Secondary | ICD-10-CM | POA: Diagnosis not present

## 2021-08-18 DIAGNOSIS — I517 Cardiomegaly: Secondary | ICD-10-CM | POA: Diagnosis not present

## 2021-08-18 DIAGNOSIS — K521 Toxic gastroenteritis and colitis: Secondary | ICD-10-CM | POA: Diagnosis not present

## 2021-08-19 DIAGNOSIS — E878 Other disorders of electrolyte and fluid balance, not elsewhere classified: Secondary | ICD-10-CM | POA: Diagnosis not present

## 2021-08-24 DIAGNOSIS — K521 Toxic gastroenteritis and colitis: Secondary | ICD-10-CM | POA: Diagnosis not present

## 2021-08-24 DIAGNOSIS — C7951 Secondary malignant neoplasm of bone: Secondary | ICD-10-CM | POA: Diagnosis not present

## 2021-08-24 DIAGNOSIS — C50912 Malignant neoplasm of unspecified site of left female breast: Secondary | ICD-10-CM | POA: Diagnosis not present

## 2021-08-24 DIAGNOSIS — G893 Neoplasm related pain (acute) (chronic): Secondary | ICD-10-CM | POA: Diagnosis not present

## 2021-08-24 DIAGNOSIS — C50919 Malignant neoplasm of unspecified site of unspecified female breast: Secondary | ICD-10-CM | POA: Diagnosis not present

## 2021-08-24 DIAGNOSIS — C782 Secondary malignant neoplasm of pleura: Secondary | ICD-10-CM | POA: Diagnosis not present

## 2021-08-24 DIAGNOSIS — C787 Secondary malignant neoplasm of liver and intrahepatic bile duct: Secondary | ICD-10-CM | POA: Diagnosis not present

## 2021-08-24 DIAGNOSIS — Z5111 Encounter for antineoplastic chemotherapy: Secondary | ICD-10-CM | POA: Diagnosis not present

## 2021-08-24 DIAGNOSIS — C7931 Secondary malignant neoplasm of brain: Secondary | ICD-10-CM | POA: Diagnosis not present

## 2021-08-24 DIAGNOSIS — Z5112 Encounter for antineoplastic immunotherapy: Secondary | ICD-10-CM | POA: Diagnosis not present

## 2021-08-24 DIAGNOSIS — C7800 Secondary malignant neoplasm of unspecified lung: Secondary | ICD-10-CM | POA: Diagnosis not present

## 2021-08-31 DIAGNOSIS — C50912 Malignant neoplasm of unspecified site of left female breast: Secondary | ICD-10-CM | POA: Diagnosis not present

## 2021-08-31 DIAGNOSIS — Z5111 Encounter for antineoplastic chemotherapy: Secondary | ICD-10-CM | POA: Diagnosis not present

## 2021-08-31 DIAGNOSIS — Z79899 Other long term (current) drug therapy: Secondary | ICD-10-CM | POA: Diagnosis not present

## 2021-09-17 DIAGNOSIS — C7951 Secondary malignant neoplasm of bone: Secondary | ICD-10-CM | POA: Diagnosis not present

## 2021-09-17 DIAGNOSIS — K521 Toxic gastroenteritis and colitis: Secondary | ICD-10-CM | POA: Diagnosis not present

## 2021-09-17 DIAGNOSIS — C787 Secondary malignant neoplasm of liver and intrahepatic bile duct: Secondary | ICD-10-CM | POA: Diagnosis not present

## 2021-09-17 DIAGNOSIS — C50911 Malignant neoplasm of unspecified site of right female breast: Secondary | ICD-10-CM | POA: Diagnosis not present

## 2021-09-17 DIAGNOSIS — D0512 Intraductal carcinoma in situ of left breast: Secondary | ICD-10-CM | POA: Diagnosis not present

## 2021-09-17 DIAGNOSIS — E876 Hypokalemia: Secondary | ICD-10-CM | POA: Diagnosis not present

## 2021-09-17 DIAGNOSIS — R52 Pain, unspecified: Secondary | ICD-10-CM | POA: Diagnosis not present

## 2021-09-17 DIAGNOSIS — Z5112 Encounter for antineoplastic immunotherapy: Secondary | ICD-10-CM | POA: Diagnosis not present

## 2021-09-17 DIAGNOSIS — C7800 Secondary malignant neoplasm of unspecified lung: Secondary | ICD-10-CM | POA: Diagnosis not present

## 2021-09-17 DIAGNOSIS — R197 Diarrhea, unspecified: Secondary | ICD-10-CM | POA: Diagnosis not present

## 2021-09-17 DIAGNOSIS — Z5111 Encounter for antineoplastic chemotherapy: Secondary | ICD-10-CM | POA: Diagnosis not present

## 2021-09-23 DIAGNOSIS — Z79899 Other long term (current) drug therapy: Secondary | ICD-10-CM | POA: Diagnosis not present

## 2021-09-23 DIAGNOSIS — C50912 Malignant neoplasm of unspecified site of left female breast: Secondary | ICD-10-CM | POA: Diagnosis not present

## 2021-09-23 DIAGNOSIS — Z5111 Encounter for antineoplastic chemotherapy: Secondary | ICD-10-CM | POA: Diagnosis not present

## 2021-09-25 DIAGNOSIS — G4709 Other insomnia: Secondary | ICD-10-CM | POA: Diagnosis not present

## 2021-10-07 DIAGNOSIS — Z5111 Encounter for antineoplastic chemotherapy: Secondary | ICD-10-CM | POA: Diagnosis not present

## 2021-10-07 DIAGNOSIS — Z1379 Encounter for other screening for genetic and chromosomal anomalies: Secondary | ICD-10-CM | POA: Diagnosis not present

## 2021-10-07 DIAGNOSIS — E876 Hypokalemia: Secondary | ICD-10-CM | POA: Diagnosis not present

## 2021-10-07 DIAGNOSIS — Z853 Personal history of malignant neoplasm of breast: Secondary | ICD-10-CM | POA: Diagnosis not present

## 2021-10-07 DIAGNOSIS — C787 Secondary malignant neoplasm of liver and intrahepatic bile duct: Secondary | ICD-10-CM | POA: Diagnosis not present

## 2021-10-07 DIAGNOSIS — Z5112 Encounter for antineoplastic immunotherapy: Secondary | ICD-10-CM | POA: Diagnosis not present

## 2021-10-07 DIAGNOSIS — Z79899 Other long term (current) drug therapy: Secondary | ICD-10-CM | POA: Diagnosis not present

## 2021-10-07 DIAGNOSIS — C7951 Secondary malignant neoplasm of bone: Secondary | ICD-10-CM | POA: Diagnosis not present

## 2021-10-07 DIAGNOSIS — C7800 Secondary malignant neoplasm of unspecified lung: Secondary | ICD-10-CM | POA: Diagnosis not present

## 2021-10-07 DIAGNOSIS — K521 Toxic gastroenteritis and colitis: Secondary | ICD-10-CM | POA: Diagnosis not present

## 2021-10-07 DIAGNOSIS — C50911 Malignant neoplasm of unspecified site of right female breast: Secondary | ICD-10-CM | POA: Diagnosis not present

## 2021-10-19 DIAGNOSIS — M25462 Effusion, left knee: Secondary | ICD-10-CM | POA: Diagnosis not present

## 2021-10-19 DIAGNOSIS — M25469 Effusion, unspecified knee: Secondary | ICD-10-CM | POA: Diagnosis not present

## 2021-10-19 DIAGNOSIS — S8002XA Contusion of left knee, initial encounter: Secondary | ICD-10-CM | POA: Diagnosis not present

## 2021-10-19 DIAGNOSIS — M25562 Pain in left knee: Secondary | ICD-10-CM | POA: Diagnosis not present

## 2021-10-28 DIAGNOSIS — Z5111 Encounter for antineoplastic chemotherapy: Secondary | ICD-10-CM | POA: Diagnosis not present

## 2021-10-28 DIAGNOSIS — C50911 Malignant neoplasm of unspecified site of right female breast: Secondary | ICD-10-CM | POA: Diagnosis not present

## 2021-10-28 DIAGNOSIS — C787 Secondary malignant neoplasm of liver and intrahepatic bile duct: Secondary | ICD-10-CM | POA: Diagnosis not present

## 2021-10-28 DIAGNOSIS — Z86000 Personal history of in-situ neoplasm of breast: Secondary | ICD-10-CM | POA: Diagnosis not present

## 2021-10-28 DIAGNOSIS — C7951 Secondary malignant neoplasm of bone: Secondary | ICD-10-CM | POA: Diagnosis not present

## 2021-10-28 DIAGNOSIS — Z853 Personal history of malignant neoplasm of breast: Secondary | ICD-10-CM | POA: Diagnosis not present

## 2021-10-28 DIAGNOSIS — M545 Low back pain, unspecified: Secondary | ICD-10-CM | POA: Diagnosis not present

## 2021-10-28 DIAGNOSIS — R197 Diarrhea, unspecified: Secondary | ICD-10-CM | POA: Diagnosis not present

## 2021-10-28 DIAGNOSIS — Z5112 Encounter for antineoplastic immunotherapy: Secondary | ICD-10-CM | POA: Diagnosis not present

## 2021-10-28 DIAGNOSIS — C7800 Secondary malignant neoplasm of unspecified lung: Secondary | ICD-10-CM | POA: Diagnosis not present

## 2021-10-28 DIAGNOSIS — G629 Polyneuropathy, unspecified: Secondary | ICD-10-CM | POA: Diagnosis not present

## 2021-11-11 DIAGNOSIS — J385 Laryngeal spasm: Secondary | ICD-10-CM | POA: Diagnosis not present

## 2021-11-14 ENCOUNTER — Other Ambulatory Visit: Payer: Self-pay | Admitting: Family Medicine

## 2021-11-14 DIAGNOSIS — E119 Type 2 diabetes mellitus without complications: Secondary | ICD-10-CM

## 2021-11-17 DIAGNOSIS — C787 Secondary malignant neoplasm of liver and intrahepatic bile duct: Secondary | ICD-10-CM | POA: Diagnosis not present

## 2021-11-17 DIAGNOSIS — C50912 Malignant neoplasm of unspecified site of left female breast: Secondary | ICD-10-CM | POA: Diagnosis not present

## 2021-11-17 DIAGNOSIS — R918 Other nonspecific abnormal finding of lung field: Secondary | ICD-10-CM | POA: Diagnosis not present

## 2021-11-17 DIAGNOSIS — C7951 Secondary malignant neoplasm of bone: Secondary | ICD-10-CM | POA: Diagnosis not present

## 2021-11-18 DIAGNOSIS — C50911 Malignant neoplasm of unspecified site of right female breast: Secondary | ICD-10-CM | POA: Diagnosis not present

## 2021-11-18 DIAGNOSIS — C7800 Secondary malignant neoplasm of unspecified lung: Secondary | ICD-10-CM | POA: Diagnosis not present

## 2021-11-18 DIAGNOSIS — G893 Neoplasm related pain (acute) (chronic): Secondary | ICD-10-CM | POA: Diagnosis not present

## 2021-11-18 DIAGNOSIS — Z1379 Encounter for other screening for genetic and chromosomal anomalies: Secondary | ICD-10-CM | POA: Diagnosis not present

## 2021-11-18 DIAGNOSIS — E878 Other disorders of electrolyte and fluid balance, not elsewhere classified: Secondary | ICD-10-CM | POA: Diagnosis not present

## 2021-11-18 DIAGNOSIS — C787 Secondary malignant neoplasm of liver and intrahepatic bile duct: Secondary | ICD-10-CM | POA: Diagnosis not present

## 2021-11-18 DIAGNOSIS — Z5112 Encounter for antineoplastic immunotherapy: Secondary | ICD-10-CM | POA: Diagnosis not present

## 2021-11-18 DIAGNOSIS — C50912 Malignant neoplasm of unspecified site of left female breast: Secondary | ICD-10-CM | POA: Diagnosis not present

## 2021-11-18 DIAGNOSIS — Z5111 Encounter for antineoplastic chemotherapy: Secondary | ICD-10-CM | POA: Diagnosis not present

## 2021-11-18 DIAGNOSIS — C7951 Secondary malignant neoplasm of bone: Secondary | ICD-10-CM | POA: Diagnosis not present

## 2021-12-09 DIAGNOSIS — Z79899 Other long term (current) drug therapy: Secondary | ICD-10-CM | POA: Diagnosis not present

## 2021-12-09 DIAGNOSIS — Z5112 Encounter for antineoplastic immunotherapy: Secondary | ICD-10-CM | POA: Diagnosis not present

## 2021-12-09 DIAGNOSIS — C50912 Malignant neoplasm of unspecified site of left female breast: Secondary | ICD-10-CM | POA: Diagnosis not present

## 2021-12-10 ENCOUNTER — Other Ambulatory Visit: Payer: Self-pay | Admitting: Family Medicine

## 2021-12-10 DIAGNOSIS — I10 Essential (primary) hypertension: Secondary | ICD-10-CM

## 2021-12-23 ENCOUNTER — Ambulatory Visit: Payer: Medicare PPO | Admitting: Family Medicine

## 2021-12-23 ENCOUNTER — Encounter: Payer: Self-pay | Admitting: Family Medicine

## 2021-12-23 ENCOUNTER — Ambulatory Visit
Admission: RE | Admit: 2021-12-23 | Discharge: 2021-12-23 | Disposition: A | Discharge status: Home / Self Care | Payer: Medicare PPO | Attending: Family Medicine | Admitting: Family Medicine

## 2021-12-23 ENCOUNTER — Ambulatory Visit
Admission: RE | Admit: 2021-12-23 | Discharge: 2021-12-23 | Disposition: A | Discharge status: Home / Self Care | Payer: Medicare PPO | Source: Ambulatory Visit | Attending: Family Medicine | Admitting: Family Medicine

## 2021-12-23 VITALS — BP 124/80 | HR 79 | Resp 12 | Ht 62.0 in | Wt 162.0 lb

## 2021-12-23 DIAGNOSIS — R051 Acute cough: Secondary | ICD-10-CM

## 2021-12-23 DIAGNOSIS — E039 Hypothyroidism, unspecified: Secondary | ICD-10-CM | POA: Diagnosis not present

## 2021-12-23 DIAGNOSIS — E119 Type 2 diabetes mellitus without complications: Secondary | ICD-10-CM | POA: Diagnosis not present

## 2021-12-23 DIAGNOSIS — E782 Mixed hyperlipidemia: Secondary | ICD-10-CM

## 2021-12-23 DIAGNOSIS — J301 Allergic rhinitis due to pollen: Secondary | ICD-10-CM

## 2021-12-23 DIAGNOSIS — I10 Essential (primary) hypertension: Secondary | ICD-10-CM

## 2021-12-23 DIAGNOSIS — K219 Gastro-esophageal reflux disease without esophagitis: Secondary | ICD-10-CM | POA: Diagnosis not present

## 2021-12-23 DIAGNOSIS — J45991 Cough variant asthma: Secondary | ICD-10-CM | POA: Diagnosis not present

## 2021-12-23 DIAGNOSIS — R059 Cough, unspecified: Secondary | ICD-10-CM | POA: Diagnosis not present

## 2021-12-23 DIAGNOSIS — J439 Emphysema, unspecified: Secondary | ICD-10-CM | POA: Diagnosis not present

## 2021-12-23 LAB — LIPID PANEL
Cholesterol: 156 (ref 0–200)
HDL: 58 (ref 35–70)
LDL Cholesterol: 69
LDl/HDL Ratio: 1.2
Triglycerides: 176 — AB (ref 40–160)

## 2021-12-23 LAB — HEMOGLOBIN A1C: Hemoglobin A1C: 6.6

## 2021-12-23 LAB — TSH: TSH: 2.06 (ref 0.41–5.90)

## 2021-12-23 MED ORDER — ALBUTEROL SULFATE HFA 108 (90 BASE) MCG/ACT IN AERS: 1.0000 | INHALATION_SPRAY | Freq: Four times a day (QID) | RESPIRATORY_TRACT | 11 refills | Status: DC | PRN

## 2021-12-23 MED ORDER — METFORMIN HCL ER 500 MG PO TB24: ORAL_TABLET | ORAL | 1 refills | Status: DC

## 2021-12-23 MED ORDER — HYDROCHLOROTHIAZIDE 25 MG PO TABS: 25.0000 mg | ORAL_TABLET | Freq: Every day | ORAL | 1 refills | Status: DC

## 2021-12-23 MED ORDER — LEVOTHYROXINE SODIUM 50 MCG PO TABS: 50.0000 ug | ORAL_TABLET | Freq: Every day | ORAL | 1 refills | Status: DC

## 2021-12-23 MED ORDER — OMEPRAZOLE 40 MG PO CPDR: 40.0000 mg | DELAYED_RELEASE_CAPSULE | Freq: Every day | ORAL | 1 refills | Status: DC

## 2021-12-23 MED ORDER — VICTOZA 18 MG/3ML ~~LOC~~ SOPN: PEN_INJECTOR | SUBCUTANEOUS | 1 refills | Status: DC

## 2021-12-23 MED ORDER — MONTELUKAST SODIUM 10 MG PO TABS: 10.0000 mg | ORAL_TABLET | Freq: Every day | ORAL | 1 refills | Status: DC

## 2021-12-23 MED ORDER — ATORVASTATIN CALCIUM 10 MG PO TABS: 10.0000 mg | ORAL_TABLET | Freq: Every day | ORAL | 1 refills | Status: DC

## 2021-12-23 NOTE — Progress Notes (Signed)
? ? ?Date:  12/23/2021  ? ?Name:  Linda Vazquez   DOB:  December 29, 1945   MRN:  [id number] ? ? ?Chief Complaint: Hyperlipidemia, Hypothyroidism, Diabetes, Hypertension, and Allergic Rhinitis  ? ?Hyperlipidemia ?This is a chronic problem. The current episode started more than 1 year ago. The problem is controlled. Recent lipid tests were reviewed and are normal. Exacerbating diseases include diabetes and hypothyroidism. She has no history of chronic renal disease. Pertinent negatives include no chest pain, myalgias or shortness of breath. Current antihyperlipidemic treatment includes statins. The current treatment provides moderate improvement of lipids. There are no compliance problems.   ?Diabetes ?She presents for her follow-up diabetic visit. She has type 2 diabetes mellitus. Her disease course has been stable. There are no hypoglycemic associated symptoms. Pertinent negatives for hypoglycemia include no dizziness, headaches, nervousness/anxiousness or tremors. Pertinent negatives for diabetes include no blurred vision, no chest pain, no fatigue, no polydipsia, no polyuria, no visual change and no weight loss. There are no hypoglycemic complications. Symptoms are stable. There are no diabetic complications. Pertinent negatives for diabetic complications include no CVA, PVD or retinopathy. Risk factors for coronary artery disease include diabetes mellitus and dyslipidemia. Current diabetic treatment includes oral agent (monotherapy) (metformen and victoza). She is following a generally healthy diet.  ?Hypertension ?This is a chronic problem. The current episode started more than 1 year ago. The problem has been gradually improving since onset. The problem is controlled. Pertinent negatives include no anxiety, blurred vision, chest pain, headaches, neck pain, palpitations or shortness of breath. Past treatments include diuretics. The current treatment provides moderate improvement. There are no compliance problems.   There is no history of angina, kidney disease, CAD/MI, CVA, heart failure, left ventricular hypertrophy, PVD or retinopathy. Identifiable causes of hypertension include a thyroid problem. There is no history of chronic renal disease, a hypertension causing med or renovascular disease.  ?Thyroid Problem ?Presents for follow-up visit. Patient reports no anxiety, cold intolerance, constipation, depressed mood, diaphoresis, diarrhea, dry skin, fatigue, hair loss, heat intolerance, hoarse voice, leg swelling, menstrual problem, nail problem, palpitations, tremors, visual change, weight gain or weight loss. The symptoms have been stable. Her past medical history is significant for diabetes and hyperlipidemia. There is no history of heart failure.  ?URI  ?Chronicity: for allergic rhinitis. The current episode started more than 1 year ago. There has been no fever. Associated symptoms include congestion and coughing. Pertinent negatives include no abdominal pain, chest pain, diarrhea, dysuria, ear pain, headaches, nausea, neck pain, rash, rhinorrhea, sore throat or wheezing. She has tried nothing for the symptoms.  ? ?Lab Results  ?Component Value Date  ? NA 138 04/26/2021  ? K 4.6 04/26/2021  ? CO2 28 (A) 04/26/2021  ? GLUCOSE 101 (H) 12/23/2020  ? BUN 30 (A) 04/26/2021  ? CREATININE 1.4 (A) 04/26/2021  ? CALCIUM 9.7 04/26/2021  ? EGFR 53 (L) 12/23/2020  ? GFRNONAA 45 (L) 04/15/2020  ? ?Lab Results  ?Component Value Date  ? CHOL 159 12/23/2020  ? HDL 76 12/23/2020  ? [place] 67 12/23/2020  ? TRIG 87 12/23/2020  ? CHOLHDL 3.0 01/17/2019  ? ?Lab Results  ?Component Value Date  ? TSH 2.230 12/23/2020  ? ?Lab Results  ?Component Value Date  ? HGBA1C 7.1 (H) 12/23/2020  ? ?Lab Results  ?Component Value Date  ? WBC 6.4 04/26/2021  ? HGB 12.8 04/26/2021  ? HCT 39 04/26/2021  ? PLT 244 04/26/2021  ? ?Lab Results  ?Component Value Date  ?  ALT 20 04/26/2021  ? AST 27 04/26/2021  ? ALKPHOS 119 04/26/2021  ? BILITOT 0.5 12/23/2020   ? ?No results found for: 25OHVITD2, [place], VD25OH  ? ?Review of Systems  ?Constitutional:  Negative for chills, diaphoresis, fatigue, fever, weight gain and weight loss.  ?HENT:  Positive for congestion. Negative for drooling, ear discharge, ear pain, hoarse voice, rhinorrhea and sore throat.   ?Eyes:  Negative for blurred vision.  ?Respiratory:  Positive for cough. Negative for shortness of breath and wheezing.   ?Cardiovascular:  Negative for chest pain, palpitations and leg swelling.  ?Gastrointestinal:  Negative for abdominal pain, blood in stool, constipation, diarrhea and nausea.  ?Endocrine: Negative for cold intolerance, heat intolerance, polydipsia and polyuria.  ?Genitourinary:  Negative for dysuria, frequency, hematuria, menstrual problem and urgency.  ?Musculoskeletal:  Negative for back pain, myalgias and neck pain.  ?Skin:  Negative for rash.  ?Allergic/Immunologic: Negative for environmental allergies.  ?Neurological:  Negative for dizziness, tremors and headaches.  ?Hematological:  Does not bruise/bleed easily.  ?Psychiatric/Behavioral:  Negative for suicidal ideas. The patient is not nervous/anxious.   ? ?Patient Active Problem List  ? Diagnosis Date Noted  ? Chronic right shoulder pain 06/05/2020  ? Cervical radiculopathy 06/05/2020  ? Venous stasis dermatitis of left lower extremity 03/07/2019  ? Venous insufficiency of both lower extremities 03/07/2019  ? Body mass index (BMI) 33.0-33.9, adult 03/07/2019  ? Type 2 diabetes mellitus without complication, without long-term current use of insulin ([place]) 01/17/2019  ? Nontraumatic complete tear of right rotator cuff 12/13/2018  ? Rotator cuff tendinitis, right 12/13/2018  ? Genetic testing 08/29/2017  ? Asthma, cough variant 05/29/2015  ? 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 Chronic cough 10/31/2014  ? Esophageal dysfunction 09/23/2014  ? Bony exostosis 12/07/2013  ? BP (high blood pressure) 06/04/2013  ? Dysphonia 03/01/2013  ? Adult  hypothyroidism 03/01/2013  ? Post-tussive emesis 03/01/2013  ? Malignant neoplasm of breast ([place]) 02/09/2013  ? Acquired lymphedema 02/09/2013  ? 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 ? ?Allergies  ?Allergen Reactions  ? Ace Inhibitors Cough  ?  Per pt bp med caused cough possible ace inhibitor  ? Sulfa Antibiotics Rash  ?  Other Reaction: Not Assessed  ? ? ?Past Surgical History:  ?Procedure Laterality Date  ? BIOPSY THYROID    ? benign  ? BREAST LUMPECTOMY Right   ? COLONOSCOPY  2006  ? repeat in 26 yrs- [facility]  ? KNEE ARTHROSCOPY [place] Bilateral   ? MASTECTOMY Left   ? MASTECTOMY Right   ? 2 yrs later  ? MEDIASTINAL MASS EXCISION    ? SKIN CANCER EXCISION    ? melanoma- stripped nodes on L) side  ? TONSILLECTOMY    ? tubal cauterization    ? ? ?Social History  ? ?Tobacco Use  ? Smoking status: Never  ? Smokeless tobacco: Never  ? Tobacco comments:  ?  Smoking cessation materials not required  ?Vaping Use  ? Vaping Use: Never used  ?Substance Use Topics  ? Alcohol use: Yes  ?  Alcohol/week: 0.0 standard drinks  ?  Comment: socially 1-2 x month  ? Drug use: No  ? ? ? ?Medication list has been reviewed and updated. ? ?Current Meds  ?Medication Sig  ? ACCU-CHEK GUIDE test strip USE TO TEST EVERY DAY  ? Accu-Chek Softclix Lancets lancets 100 each by Other route daily. Use as instructed/ Dx E11.9  ? albuterol (VENTOLIN HFA) 108 (90 Base) MCG/ACT inhaler  Inhale into the lungs.  ? ALPRAZolam (XANAX) 1 MG tablet Take by mouth.  ? amitriptyline (ELAVIL) 10 MG tablet Take 1 tablet by mouth at bedtime as needed. Dr Rodolph Bong  ? ascorbic acid (VITAMIN C) 250 MG CHEW Chew 500 mg by mouth daily.  ? aspirin EC 81 MG tablet Take 1 tablet by mouth daily.  ? atorvastatin (LIPITOR) 10 MG tablet Take 1 tablet (10 mg total) by mouth daily.  ? azelastine (ASTELIN) 0.1 % nasal spray Place 1 spray into the nose 2 (two) times daily. [facility]  ? benzonatate (TESSALON) 100 MG capsule TAKE 1 CAPSULE BY MOUTH 2 TIMES  DAILY AS NEEDED.  ? Blood Glucose Monitoring Suppl (ACCU-CHEK GUIDE ME) w/Device KIT   ? CALCIUM PO Take 650 mg by mouth in the morning and at bedtime.  ? cetirizine-pseudoephedrine (ZYRTEC-D) 5-120 MG tablet Take 1 tabl

## 2021-12-24 ENCOUNTER — Telehealth: Payer: Self-pay

## 2021-12-24 NOTE — Telephone Encounter (Signed)
Pt called back regarding lab results and if she needs to continue taking her medications, please advise.  ?

## 2021-12-24 NOTE — Telephone Encounter (Signed)
Copied from [place] [id number]. Topic: General - Other ?>> Dec 24, 2021 11:09 AM Antonieta Iba C wrote: ?Reason for CRM: pt called in to follow up with provider on her X-Ray results. Pt says that she was told that she would receive a call once they are back.. ? ? ?Please assist pt further. ?

## 2021-12-25 ENCOUNTER — Telehealth: Payer: Self-pay

## 2021-12-25 NOTE — Telephone Encounter (Signed)
Called with labs- stay off metformin and stay on Victoza- recheck A1C in 6 weeks ?

## 2021-12-25 NOTE — Telephone Encounter (Signed)
Pt informed of Xray results by Baxter Flattery. Told pt we will call her when we get her labs results. Pt verbalized understanding. ? ?KP ?

## 2021-12-26 LAB — LIPID PANEL WITH LDL/HDL RATIO
Cholesterol, Total: 156 mg/dL (ref 100–199)
HDL: 58 mg/dL (ref >39)
LDL Chol Calc (NIH): 69 mg/dL (ref 0–99)
LDL/HDL Ratio: 1.2 ratio (ref 0.0–3.2)
Triglycerides: 176 mg/dL — ABNORMAL HIGH (ref 0–149)
VLDL Cholesterol Cal: 29 mg/dL (ref 5–40)

## 2021-12-26 LAB — THYROID PANEL WITH TSH
Free Thyroxine Index: 2.3 (ref 1.2–4.9)
T3 Uptake Ratio: 24 % (ref 24–39)
T4, Total: 9.7 ug/dL (ref 4.5–12.0)
TSH: 2.06 u[IU]/mL (ref 0.450–4.500)

## 2021-12-26 LAB — HEMOGLOBIN A1C
Est. average glucose Bld gHb Est-mCnc: 143 mg/dL
Hgb A1c MFr Bld: 6.6 % — ABNORMAL HIGH (ref 4.8–5.6)

## 2021-12-26 LAB — MICROALBUMIN, URINE: Microalbumin, Urine: 13.5 ug/mL

## 2021-12-30 DIAGNOSIS — Z5112 Encounter for antineoplastic immunotherapy: Secondary | ICD-10-CM | POA: Diagnosis not present

## 2021-12-30 DIAGNOSIS — E878 Other disorders of electrolyte and fluid balance, not elsewhere classified: Secondary | ICD-10-CM | POA: Diagnosis not present

## 2021-12-30 DIAGNOSIS — C50911 Malignant neoplasm of unspecified site of right female breast: Secondary | ICD-10-CM | POA: Diagnosis not present

## 2021-12-30 DIAGNOSIS — C787 Secondary malignant neoplasm of liver and intrahepatic bile duct: Secondary | ICD-10-CM | POA: Diagnosis not present

## 2021-12-30 DIAGNOSIS — C7951 Secondary malignant neoplasm of bone: Secondary | ICD-10-CM | POA: Diagnosis not present

## 2021-12-30 DIAGNOSIS — Z9013 Acquired absence of bilateral breasts and nipples: Secondary | ICD-10-CM | POA: Diagnosis not present

## 2021-12-30 DIAGNOSIS — C7800 Secondary malignant neoplasm of unspecified lung: Secondary | ICD-10-CM | POA: Diagnosis not present

## 2021-12-30 DIAGNOSIS — Z171 Estrogen receptor negative status [ER-]: Secondary | ICD-10-CM | POA: Diagnosis not present

## 2021-12-30 DIAGNOSIS — R52 Pain, unspecified: Secondary | ICD-10-CM | POA: Diagnosis not present

## 2022-01-11 ENCOUNTER — Other Ambulatory Visit: Payer: Self-pay

## 2022-01-11 ENCOUNTER — Telehealth: Payer: Self-pay | Admitting: Family Medicine

## 2022-01-11 DIAGNOSIS — E119 Type 2 diabetes mellitus without complications: Secondary | ICD-10-CM

## 2022-01-11 MED ORDER — BD PEN NEEDLE NANO 2ND GEN 32G X 4 MM MISC: 1 refills | Status: DC

## 2022-01-11 NOTE — Telephone Encounter (Signed)
Medication Refill - Medication: victoza needles. Patient states pharmacy is unsure of the needle size, please confirm with patient and pharmacy of correct needle size  ? ?Has the patient contacted their pharmacy? Yes.   ? ? ?Preferred Pharmacy (with phone number or street name):  ? ?[id number]  [phone] ?Fax:  [phone] ?  ? ? ?Has the patient been seen for an appointment in the last year OR does the patient have an upcoming appointment? Yes.   ? ?Agent: Please be advised that RX refills may take up to 3 business days. We ask that you follow-up with your pharmacy.23 ml x 4 ml  ?

## 2022-01-20 DIAGNOSIS — Z5112 Encounter for antineoplastic immunotherapy: Secondary | ICD-10-CM | POA: Diagnosis not present

## 2022-01-20 DIAGNOSIS — C50912 Malignant neoplasm of unspecified site of left female breast: Secondary | ICD-10-CM | POA: Diagnosis not present

## 2022-01-20 DIAGNOSIS — Z79899 Other long term (current) drug therapy: Secondary | ICD-10-CM | POA: Diagnosis not present

## 2022-02-11 DIAGNOSIS — R350 Frequency of micturition: Secondary | ICD-10-CM | POA: Diagnosis not present

## 2022-02-11 DIAGNOSIS — C50911 Malignant neoplasm of unspecified site of right female breast: Secondary | ICD-10-CM | POA: Diagnosis not present

## 2022-02-11 DIAGNOSIS — C50919 Malignant neoplasm of unspecified site of unspecified female breast: Secondary | ICD-10-CM | POA: Diagnosis not present

## 2022-02-11 DIAGNOSIS — Z9013 Acquired absence of bilateral breasts and nipples: Secondary | ICD-10-CM | POA: Diagnosis not present

## 2022-02-11 DIAGNOSIS — C50912 Malignant neoplasm of unspecified site of left female breast: Secondary | ICD-10-CM | POA: Diagnosis not present

## 2022-02-11 DIAGNOSIS — Z5112 Encounter for antineoplastic immunotherapy: Secondary | ICD-10-CM | POA: Diagnosis not present

## 2022-02-11 DIAGNOSIS — C787 Secondary malignant neoplasm of liver and intrahepatic bile duct: Secondary | ICD-10-CM | POA: Diagnosis not present

## 2022-02-11 DIAGNOSIS — Z79899 Other long term (current) drug therapy: Secondary | ICD-10-CM | POA: Diagnosis not present

## 2022-02-11 DIAGNOSIS — C7951 Secondary malignant neoplasm of bone: Secondary | ICD-10-CM | POA: Diagnosis not present

## 2022-02-24 ENCOUNTER — Ambulatory Visit: Payer: Medicare PPO

## 2022-03-01 ENCOUNTER — Ambulatory Visit (INDEPENDENT_AMBULATORY_CARE_PROVIDER_SITE_OTHER): Payer: Medicare PPO

## 2022-03-01 DIAGNOSIS — Z0000 Encounter for general adult medical examination without abnormal findings: Secondary | ICD-10-CM

## 2022-03-01 NOTE — Patient Instructions (Signed)
Linda Vazquez , Thank you for taking time to come for your Medicare Wellness Visit. I appreciate your ongoing commitment to your health goals. Please review the following plan we discussed and let me know if I can assist you in the future.   Screening recommendations/referrals: Colonoscopy: no longer required Bone Density: done 11/11/15 Recommended yearly ophthalmology/optometry visit for glaucoma screening and checkup Recommended yearly dental visit for hygiene and checkup  Vaccinations: Influenza vaccine: due fall 2023 Pneumococcal vaccine: done 11/10/16 Tdap vaccine: done 11/06/15 Shingles vaccine: Shingrix discussed. Please contact your pharmacy for coverage information.  Covid-19:done 10/05/19, 11/02/19, 08/05/20 & 01/22/21  Advanced directives: Please bring a copy of your health care power of attorney and living will to the office at your convenience.   Conditions/risks identified: recommend fall prevention in the home  Next appointment: Follow up in one year for your annual wellness visit    Preventive Care 65 Years and Older, Female Preventive care refers to lifestyle choices and visits with your health care provider that can promote health and wellness. What does preventive care include? A yearly physical exam. This is also called an annual well check. Dental exams once or twice a year. Routine eye exams. Ask your health care provider how often you should have your eyes checked. Personal lifestyle choices, including: Daily care of your teeth and gums. Regular physical activity. Eating a healthy diet. Avoiding tobacco and drug use. Limiting alcohol use. Practicing safe sex. Taking low-dose aspirin every day. Taking vitamin and mineral supplements as recommended by your health care provider. What happens during an annual well check? The services and screenings done by your health care provider during your annual well check will depend on your age, overall health, lifestyle risk  factors, and family history of disease. Counseling  Your health care provider may ask you questions about your: Alcohol use. Tobacco use. Drug use. Emotional well-being. Home and relationship well-being. Sexual activity. Eating habits. History of falls. Memory and ability to understand (cognition). Work and work Statistician. Reproductive health. Screening  You may have the following tests or measurements: Height, weight, and BMI. Blood pressure. Lipid and cholesterol levels. These may be checked every 5 years, or more frequently if you are over 57 years old. Skin check. Lung cancer screening. You may have this screening every year starting at age 43 if you have a 30-pack-year history of smoking and currently smoke or have quit within the past 15 years. Fecal occult blood test (FOBT) of the stool. You may have this test every year starting at age 68. Flexible sigmoidoscopy or colonoscopy. You may have a sigmoidoscopy every 5 years or a colonoscopy every 10 years starting at age 51. Hepatitis C blood test. Hepatitis B blood test. Sexually transmitted disease (STD) testing. Diabetes screening. This is done by checking your blood sugar (glucose) after you have not eaten for a while (fasting). You may have this done every 1-3 years. Bone density scan. This is done to screen for osteoporosis. You may have this done starting at age 58. Mammogram. This may be done every 1-2 years. Talk to your health care provider about how often you should have regular mammograms. Talk with your health care provider about your test results, treatment options, and if necessary, the need for more tests. Vaccines  Your health care provider may recommend certain vaccines, such as: Influenza vaccine. This is recommended every year. Tetanus, diphtheria, and acellular pertussis (Tdap, Td) vaccine. You may need a Td booster every 10 years. Zoster vaccine.  You may need this after age 21. Pneumococcal 13-valent  conjugate (PCV13) vaccine. One dose is recommended after age 8. Pneumococcal polysaccharide (PPSV23) vaccine. One dose is recommended after age 45. Talk to your health care provider about which screenings and vaccines you need and how often you need them. This information is not intended to replace advice given to you by your health care provider. Make sure you discuss any questions you have with your health care provider. Document Released: 10/03/2015 Document Revised: 05/26/2016 Document Reviewed: 07/08/2015 Elsevier Interactive Patient Education  2017 [place] Prevention in the Home Falls can cause injuries. They can happen to people of all ages. There are many things you can do to make your home safe and to help prevent falls. What can I do on the outside of my home? Regularly fix the edges of walkways and driveways and fix any cracks. Remove anything that might make you trip as you walk through a door, such as a raised step or threshold. Trim any bushes or trees on the path to your home. Use bright outdoor lighting. Clear any walking paths of anything that might make someone trip, such as rocks or tools. Regularly check to see if handrails are loose or broken. Make sure that both sides of any steps have handrails. Any raised decks and porches should have guardrails on the edges. Have any leaves, snow, or ice cleared regularly. Use sand or salt on walking paths during winter. Clean up any spills in your garage right away. This includes oil or grease spills. What can I do in the bathroom? Use night lights. Install grab bars by the toilet and in the tub and shower. Do not use towel bars as grab bars. Use non-skid mats or decals in the tub or shower. If you need to sit down in the shower, use a plastic, non-slip stool. Keep the floor dry. Clean up any water that spills on the floor as soon as it happens. Remove soap buildup in the tub or shower regularly. Attach bath mats  securely with double-sided non-slip rug tape. Do not have throw rugs and other things on the floor that can make you trip. What can I do in the bedroom? Use night lights. Make sure that you have a light by your bed that is easy to reach. Do not use any sheets or blankets that are too big for your bed. They should not hang down onto the floor. Have a firm chair that has side arms. You can use this for support while you get dressed. Do not have throw rugs and other things on the floor that can make you trip. What can I do in the kitchen? Clean up any spills right away. Avoid walking on wet floors. Keep items that you use a lot in easy-to-reach places. If you need to reach something above you, use a strong step stool that has a grab bar. Keep electrical cords out of the way. Do not use floor polish or wax that makes floors slippery. If you must use wax, use non-skid floor wax. Do not have throw rugs and other things on the floor that can make you trip. What can I do with my stairs? Do not leave any items on the stairs. Make sure that there are handrails on both sides of the stairs and use them. Fix handrails that are broken or loose. Make sure that handrails are as long as the stairways. Check any carpeting to make sure that it is  firmly attached to the stairs. Fix any carpet that is loose or worn. Avoid having throw rugs at the top or bottom of the stairs. If you do have throw rugs, attach them to the floor with carpet tape. Make sure that you have a light switch at the top of the stairs and the bottom of the stairs. If you do not have them, ask someone to add them for you. What else can I do to help prevent falls? Wear shoes that: Do not have high heels. Have rubber bottoms. Are comfortable and fit you well. Are closed at the toe. Do not wear sandals. If you use a stepladder: Make sure that it is fully opened. Do not climb a closed stepladder. Make sure that both sides of the stepladder  are locked into place. Ask someone to hold it for you, if possible. Clearly mark and make sure that you can see: Any grab bars or handrails. First and last steps. Where the edge of each step is. Use tools that help you move around (mobility aids) if they are needed. These include: Canes. Walkers. Scooters. Crutches. Turn on the lights when you go into a dark area. Replace any light bulbs as soon as they burn out. Set up your furniture so you have a clear path. Avoid moving your furniture around. If any of your floors are uneven, fix them. If there are any pets around you, be aware of where they are. Review your medicines with your doctor. Some medicines can make you feel dizzy. This can increase your chance of falling. Ask your doctor what other things that you can do to help prevent falls. This information is not intended to replace advice given to you by your health care provider. Make sure you discuss any questions you have with your health care provider. Document Released: 07/03/2009 Document Revised: 02/12/2016 Document Reviewed: 10/11/2014 Elsevier Interactive Patient Education  2017 [employer].

## 2022-03-01 NOTE — Progress Notes (Signed)
Subjective:   Linda Vazquez is a 76 y.o. female who presents for Medicare Annual (Subsequent) preventive examination.  Virtual Visit via Telephone Note  I connected with  Linda Vazquez on 03/01/22 at  8:15 AM EDT by telephone and verified that I am speaking with the correct person using two identifiers.  Location: Patient: home Provider: [facility] Persons participating in the virtual visit: [place]   I discussed the limitations, risks, security and privacy concerns of performing an evaluation and management service by telephone and the availability of in person appointments. The patient expressed understanding and agreed to proceed.  Interactive audio and video telecommunications were attempted between this nurse and patient, however failed, due to patient having technical difficulties OR patient did not have access to video capability.  We continued and completed visit with audio only.  Some vital signs may be absent or patient reported.   Clemetine Marker, LPN   Review of Systems     Cardiac Risk Factors include: advanced age (>27mn, >>20women);diabetes mellitus;dyslipidemia;hypertension     Objective:    There were no vitals filed for this visit. There is no height or weight on file to calculate BMI.     03/01/2022    8:37 AM 02/23/2021    8:28 AM 02/13/2020    8:39 AM 02/07/2019    8:40 AM 12/18/2018    9:43 AM 02/06/2018    8:27 AM 11/06/2015    9:53 AM  Advanced Directives  Does Patient Have a Medical Advance Directive? _0  Yes Yes  Type of AParamedicof ALaredoLiving will [place]Living will [place]ogebicLiving will Living will;Healthcare Power of Attorney Living will;Healthcare Power of [place] will [place]ackin[place] will  Does patient want to make changes to medical advance directive?     No - Patient declined    Copy  of [place] Chart? No - copy requested No - copy requested Yes - validated most recent copy scanned in chart (See row information) No - copy requested  No - copy requested No - copy requested    Current Medications (verified) Outpatient Encounter Medications as of 03/01/2022  Medication Sig   ACCU-CHEK GUIDE test strip USE TO TEST EVERY DAY   Accu-Chek Softclix Lancets lancets 100 each by Other route daily. Use as instructed/ Dx E11.9   albuterol (VENTOLIN HFA) 108 (90 Base) MCG/ACT inhaler Inhale 1-2 puffs into the lungs every 6 (six) hours as needed for wheezing or shortness of breath.   ascorbic acid (VITAMIN C) 250 MG CHEW Chew 500 mg by mouth daily.   atorvastatin (LIPITOR) 10 MG tablet Take 1 tablet (10 mg total) by mouth daily.   benzonatate (TESSALON) 100 MG capsule TAKE 1 CAPSULE BY MOUTH 2 TIMES DAILY AS NEEDED.   Blood Glucose Monitoring Suppl (ACCU-CHEK GUIDE ME) w/Device KIT    CALCIUM PO Take 650 mg by mouth in the morning and at bedtime.   carbamide peroxide (DEBROX) 6.5 % OTIC solution Place 5 drops into both ears 2 (two) times daily. (Patient taking differently: Place 5 drops into both ears 2 (two) times daily as needed.)   diphenoxylate-atropine (LOMOTIL) 2.5-0.025 MG tablet Take by mouth.   hydrochlorothiazide (HYDRODIURIL) 25 MG tablet Take 1 tablet (25 mg total) by mouth daily.   Insulin Pen Needle (BD PEN NEEDLE NANO 2ND GEN) 32G X 4 MM MISC Use as directed  levothyroxine (SYNTHROID) 50 MCG tablet Take 1 tablet (50 mcg total) by mouth daily.   lidocaine-prilocaine (EMLA) cream Apply topically.   liraglutide (VICTOZA) 18 MG/3ML SOPN INJECT 0.6 MG UNDER THE SKIN ONCE DAILY   MAGNESIUM PO Take by mouth.   montelukast (SINGULAIR) 10 MG tablet Take 1 tablet (10 mg total) by mouth at bedtime.   Multiple Vitamin (MULTIVITAMIN WITH MINERALS) TABS tablet Take 1 tablet by mouth daily.   naloxone (NARCAN) nasal spray 4 mg/0.1 mL Place into the nose.    OLANZapine (ZYPREXA) 5 MG tablet Take by mouth.   omeprazole (PRILOSEC) 40 MG capsule Take 1 capsule (40 mg total) by mouth daily.   ondansetron (ZOFRAN) 8 MG tablet Take by mouth.   prochlorperazine (COMPAZINE) 10 MG tablet Take by mouth.   SYMBICORT 160-4.5 MCG/ACT inhaler pulmonology   tretinoin (RETIN-A) 0.025 % cream Apply a pea sized amount to the entire face QHS.   Vitamin D, Cholecalciferol, 50 MCG (2000 UT) CAPS Take 1 capsule by mouth daily.   amitriptyline (ELAVIL) 10 MG tablet Take 1 tablet by mouth at bedtime as needed. Dr Rodolph Bong   [DISCONTINUED] albuterol (VENTOLIN HFA) 108 (90 Base) MCG/ACT inhaler Inhale into the lungs.   [DISCONTINUED] ALPRAZolam (XANAX) 1 MG tablet Take by mouth.   [DISCONTINUED] aspirin EC 81 MG tablet Take 1 tablet by mouth daily.   [DISCONTINUED] azelastine (ASTELIN) 0.1 % nasal spray Place 1 spray into the nose 2 (two) times daily. [facility]   [DISCONTINUED] cetirizine-pseudoephedrine (ZYRTEC-D) 5-120 MG tablet Take 1 tablet by mouth daily. OTC   [DISCONTINUED] potassium chloride 20 MEQ/15ML (10%) SOLN Take by mouth.   [DISCONTINUED] vitamin B-12 (CYANOCOBALAMIN) 1000 MCG tablet Take 1,000 mcg by mouth daily.   No facility-administered encounter medications on file as of 03/01/2022.    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   left dorsum hand   Squamous cell carcinoma of skin 09/24/2020   right dorsum hand proximal to index finger MCP - SCCIS, ED&C    Thyroid disease    Varix of lower extremity    Past Surgical History:  Procedure Laterality Date   BIOPSY THYROID     benign   BREAST  LUMPECTOMY Right    COLONOSCOPY  2006   repeat in 10 yrs- [facility] Dr   KNEE ARTHROSCOPY WITH MENISCAL REPAIR Bilateral    MASTECTOMY Left    MASTECTOMY Right    2 yrs later   MEDIASTINAL MASS EXCISION     SKIN CANCER EXCISION     melanoma- stripped nodes on L) side   TONSILLECTOMY     tubal cauterization     Family History  Problem Relation Age of Onset   Hypertension Mother    Heart disease Mother    Heart disease Father    Diabetes Brother    Diabetes Paternal Uncle    Diabetes Brother    Social History   Socioeconomic History   Marital status: Married    Spouse name: Not on file   Number of children: 0   Years of education: Not  on file   Highest education level: Master's degree (e.g., MA, MS, MEng, MEd, MSW, MBA)  Occupational History   Occupation: Retired  Tobacco Use   Smoking status: Never   Smokeless tobacco: Never   Tobacco comments:    Smoking cessation materials not required  Vaping Use   Vaping Use: Never used  Substance and Sexual Activity   Alcohol use: Yes    Alcohol/week: 0.0 standard drinks of alcohol    Comment: socially 1-2 x month   Drug use: No   Sexual activity: Not Currently  Other Topics Concern   Not on file  Social History Narrative   Not on file   Social Determinants of Health   Financial Resource Strain: Low Risk  (03/01/2022)   Overall Financial Resource Strain (CARDIA)    Difficulty of Paying Living Expenses: Not hard at all  Food Insecurity: No Food Insecurity (03/01/2022)   Hunger Vital Sign    Worried About Running Out of Food in the Last Year: Never true    Ran Out of Food in the Last Year: Never true  Transportation Needs: No Transportation Needs (03/01/2022)   PRAPARE - Hydrologist (Medical): No    Lack of Transportation (Non-Medical): No  Physical Activity: Inactive (03/01/2022)   Exercise Vital Sign    Days of Exercise per Week: 0 days    Minutes of Exercise per Session: 0 min  Stress: No  Stress Concern Present (03/01/2022)   [place]    Feeling of Stress : Not at all  Social Connections: [place] (03/01/2022)   Social Connection and Isolation Panel [NHANES]    Frequency of Communication with Friends and Family: More than three times a week    Frequency of Social Gatherings with Friends and Family: Three times a week    Attends Religious Services: More than 4 times per year    Active Member of Clubs or Organizations: Yes    Attends Music therapist: More than 4 times per year    Marital Status: Married    Tobacco Counseling Counseling given: Not Answered Tobacco comments: Smoking cessation materials not required   Clinical Intake:  Pre-visit preparation completed: Yes  Pain : No/denies pain     Nutritional Risks: None Diabetes: Yes CBG done?: No Did pt. bring in CBG monitor from home?: No  How often do you need to have someone help you when you read instructions, pamphlets, or other written materials from your doctor or pharmacy?: 1 - Never  Nutrition Risk Assessment:  Has the patient had any N/V/D within the last 2 months?  Yes  Does the patient have any non-healing wounds?  No  Has the patient had any unintentional weight loss or weight gain?  No   Diabetes:  Is the patient diabetic?  Yes  If diabetic, was a CBG obtained today?  No  Did the patient bring in their glucometer from home?  No  How often do you monitor your CBG's? Every 2 weeks.   Financial Strains and Diabetes Management:  Are you having any financial strains with the device, your supplies or your medication? No .  Does the patient want to be seen by Chronic Care Management for management of their diabetes?  No  Would the patient like to be referred to a Nutritionist or for Diabetic Management?  No   Diabetic Exams:  Diabetic Eye Exam: Completed per patient; need records.  Diabetic Foot  Exam: Completed 12/23/21.   Interpreter Needed?: No  Information entered by :: Clemetine Marker LPN   Activities of Daily Living    03/01/2022    9:18 AM 12/23/2021   10:21 AM  In your present state of health, do you have any difficulty performing the following activities:  Hearing? 0 0  Vision? 0 0  Difficulty concentrating or making decisions? 0 0  Walking or climbing stairs? 0 0  Dressing or bathing? 0 0  Doing errands, shopping? 0 0  Preparing Food and eating ? N   Using the Toilet? N   In the past six months, have you accidently leaked urine? N   Do you have problems with loss of bowel control? N   Managing your Medications? N   Managing your Finances? N   Housekeeping or managing your Housekeeping? N     Patient Care Team: Juline Patch, MD as PCP - General (Family Medicine) Ihor Dow, MD as Consulting Physician (Pulmonary Disease) Jeanine Luz, MD as Consulting Physician (Otolaryngology) [place], [facility] Bradly Bienenstock, Monia Sabal, MD (Dermatology) Dent, Penne Lash, MD as Referring Physician (Oncology)  Indicate any recent Medical Services you may have received from other than [facility] providers in the past year (date may be approximate).     Assessment:   This is a routine wellness examination for Linda Vazquez.  Hearing/Vision screen Hearing Screening - Comments:: Pt denies hearing difficulty Vision Screening - Comments:: [facility] Glennon [facility]  Dietary issues and exercise activities discussed: Current Exercise Habits: The patient does not participate in regular exercise at present, Exercise limited by: orthopedic condition(s);respiratory conditions(s)   Goals Addressed   None    Depression Screen    03/01/2022    8:36 AM 12/23/2021   10:21 AM 02/23/2021    8:27 AM 12/23/2020   10:42 AM 12/03/2020    8:10 AM 08/05/2020    7:57 AM 04/15/2020    8:39 AM  PHQ 2/9 Scores  PHQ - 2 Score 0 0 0 0 0 0 0  PHQ- 9 Score  2  0  0 0 0    Fall Risk    03/01/2022    8:38 AM 12/23/2021   10:22 AM 02/23/2021    8:29 AM 08/05/2020    7:56 AM 04/15/2020    8:39 AM  Fall Risk   Falls in the past year? 1 1 0 0 0  Number falls in past yr: 0 0 0    Injury with Fall? 1 0 0    Risk for fall due to : No Fall Risks No Fall Risks No Fall Risks    Follow up Falls prevention discussed Falls evaluation completed Falls prevention discussed Falls evaluation completed Falls evaluation completed    [place]:  Any stairs in or around the home? No  If so, are there any without handrails? No  Home free of loose throw rugs in walkways, pet beds, electrical cords, etc? Yes  Adequate lighting in your home to reduce risk of falls? Yes   ASSISTIVE DEVICES UTILIZED TO PREVENT FALLS:  Life alert? No  Use of a cane, walker or w/c? No  Grab bars in the bathroom? Yes  Shower chair or bench in shower? Yes  Elevated toilet seat or a handicapped toilet? No   TIMED UP AND GO:  Was the test performed? No . Telephonic visit.   Cognitive Function: Normal  cognitive status assessed by direct observation by this Nurse Health Advisor. No abnormalities found.          02/07/2019    8:45 AM 02/06/2018    8:30 AM  6CIT Screen  What Year? 0 points 0 points  What month? 0 points 0 points  What time? 0 points 0 points  Count back from 20 0 points 0 points  Months in reverse 0 points 0 points  Repeat phrase 0 points 0 points  Total Score 0 points 0 points    Immunizations Immunization History  Administered Date(s) Administered   Influenza, High Dose Seasonal PF 07/06/2017, 06/28/2018, 05/29/2019   Influenza-Unspecified 05/21/2014, 06/24/2014, 06/03/2015, 07/06/2017, 05/22/2019, 06/02/2020   Moderna Sars-Covid-2 Vaccination 10/05/2019, 11/02/2019, 08/05/2020, 01/22/2021   Pneumococcal Conjugate-13 11/10/2016   Pneumococcal Polysaccharide-23 01/16/2014   Tdap 11/06/2015   Zoster, Live 03/09/2012     TDAP status: Up to date  Flu Vaccine status: Up to date per patient; due fall 2023  Pneumococcal vaccine status: Up to date  Covid-19 vaccine status: Completed vaccines  Qualifies for Shingles Vaccine? Yes   Zostavax completed Yes   Shingrix Completed?: No.    Education has been provided regarding the importance of this vaccine. Patient has been advised to call insurance company to determine out of pocket expense if they have not yet received this vaccine. Advised may also receive vaccine at local pharmacy or Health Dept. Verbalized acceptance and understanding.  Screening Tests Health Maintenance  Topic Date Due   Zoster Vaccines- Shingrix (1 of 2) Never done   COVID-19 Vaccine (5 - Booster for Moderna series) 03/19/2021   OPHTHALMOLOGY EXAM  04/30/2021   INFLUENZA VACCINE  04/20/2022   HEMOGLOBIN A1C  06/24/2022   FOOT EXAM  12/24/2022   URINE MICROALBUMIN  12/24/2022   TETANUS/TDAP  11/05/2025   Pneumonia Vaccine 69+ Years old  Completed   DEXA SCAN  Completed   Hepatitis C Screening  Completed   HPV VACCINES  Aged Out   MAMMOGRAM  Discontinued   COLONOSCOPY (Pts 45-72yr Insurance coverage will need to be confirmed)  Discontinued    Health Maintenance  Health Maintenance Due  Topic Date Due   Zoster Vaccines- Shingrix (1 of 2) Never done   COVID-19 Vaccine (5 - Booster for Moderna series) 03/19/2021   OPHTHALMOLOGY EXAM  04/30/2021    Colorectal cancer screening: No longer required.   Mammogram status: No longer required due to bilateral mastectomy.  Bone Density status: Completed 11/11/15. Results reflect: Bone density results: NORMAL. Repeat every as directed years.  Lung Cancer Screening: (Low Dose CT Chest recommended if Age 78-80years, 30 pack-year currently smoking OR have quit w/in 15years.) does not qualify.  Additional Screening:  Hepatitis C Screening: does qualify; Completed 10/17/17  Vision Screening: Recommended annual ophthalmology exams for  early detection of glaucoma and other disorders of the eye. Is the patient up to date with their annual eye exam?  Yes  Who is the provider or what is the name of the office in which the patient attends annual eye exams? [facility]   Dental Screening: Recommended annual dental exams for proper oral hygiene  Community Resource Referral / Chronic Care Management: CRR required this visit?  No   CCM required this visit?  No      Plan:     I have personally reviewed and noted the following in the patient's chart:   Medical and social history Use of alcohol, tobacco or illicit drugs  Current  medications and supplements including opioid prescriptions.  Functional ability and status Nutritional status Physical activity Advanced directives List of other physicians Hospitalizations, surgeries, and ER visits in previous 12 months Vitals Screenings to include cognitive, depression, and falls Referrals and appointments  In addition, I have reviewed and discussed with patient certain preventive protocols, quality metrics, and best practice recommendations. A written personalized care plan for preventive services as well as general preventive health recommendations were provided to patient.     Clemetine Marker, LPN   [id number]/99/2341   Nurse Notes: pt c/o intermittent burning sensation in lower left leg above ankle that will last for a few minutes then go away; ongoing for the past month, mostly in afternoon to evening. Pt denies swelling or difficulty walking; advised to schedule appt if sxs worsen or persist.

## 2022-03-05 DIAGNOSIS — C50912 Malignant neoplasm of unspecified site of left female breast: Secondary | ICD-10-CM | POA: Diagnosis not present

## 2022-03-05 DIAGNOSIS — Z5112 Encounter for antineoplastic immunotherapy: Secondary | ICD-10-CM | POA: Diagnosis not present

## 2022-03-05 DIAGNOSIS — Z79899 Other long term (current) drug therapy: Secondary | ICD-10-CM | POA: Diagnosis not present

## 2022-03-24 DIAGNOSIS — E039 Hypothyroidism, unspecified: Secondary | ICD-10-CM | POA: Diagnosis not present

## 2022-03-24 DIAGNOSIS — F325 Major depressive disorder, single episode, in full remission: Secondary | ICD-10-CM | POA: Diagnosis not present

## 2022-03-24 DIAGNOSIS — C787 Secondary malignant neoplasm of liver and intrahepatic bile duct: Secondary | ICD-10-CM | POA: Diagnosis not present

## 2022-03-24 DIAGNOSIS — E669 Obesity, unspecified: Secondary | ICD-10-CM | POA: Diagnosis not present

## 2022-03-24 DIAGNOSIS — C50919 Malignant neoplasm of unspecified site of unspecified female breast: Secondary | ICD-10-CM | POA: Diagnosis not present

## 2022-03-24 DIAGNOSIS — I10 Essential (primary) hypertension: Secondary | ICD-10-CM | POA: Diagnosis not present

## 2022-03-24 DIAGNOSIS — C7951 Secondary malignant neoplasm of bone: Secondary | ICD-10-CM | POA: Diagnosis not present

## 2022-03-24 DIAGNOSIS — E119 Type 2 diabetes mellitus without complications: Secondary | ICD-10-CM | POA: Diagnosis not present

## 2022-03-24 DIAGNOSIS — J439 Emphysema, unspecified: Secondary | ICD-10-CM | POA: Diagnosis not present

## 2022-03-25 DIAGNOSIS — C50912 Malignant neoplasm of unspecified site of left female breast: Secondary | ICD-10-CM | POA: Diagnosis not present

## 2022-03-25 DIAGNOSIS — Z79620 Long term (current) use of immunosuppressive biologic: Secondary | ICD-10-CM | POA: Diagnosis not present

## 2022-03-25 DIAGNOSIS — Z5112 Encounter for antineoplastic immunotherapy: Secondary | ICD-10-CM | POA: Diagnosis not present

## 2022-04-07 DIAGNOSIS — J385 Laryngeal spasm: Secondary | ICD-10-CM | POA: Diagnosis not present

## 2022-04-15 DIAGNOSIS — C7800 Secondary malignant neoplasm of unspecified lung: Secondary | ICD-10-CM | POA: Diagnosis not present

## 2022-04-15 DIAGNOSIS — C50911 Malignant neoplasm of unspecified site of right female breast: Secondary | ICD-10-CM | POA: Diagnosis not present

## 2022-04-15 DIAGNOSIS — Z79899 Other long term (current) drug therapy: Secondary | ICD-10-CM | POA: Diagnosis not present

## 2022-04-15 DIAGNOSIS — C7951 Secondary malignant neoplasm of bone: Secondary | ICD-10-CM | POA: Diagnosis not present

## 2022-04-15 DIAGNOSIS — D0512 Intraductal carcinoma in situ of left breast: Secondary | ICD-10-CM | POA: Diagnosis not present

## 2022-04-15 DIAGNOSIS — C787 Secondary malignant neoplasm of liver and intrahepatic bile duct: Secondary | ICD-10-CM | POA: Diagnosis not present

## 2022-04-15 DIAGNOSIS — Z5111 Encounter for antineoplastic chemotherapy: Secondary | ICD-10-CM | POA: Diagnosis not present

## 2022-04-15 DIAGNOSIS — C50919 Malignant neoplasm of unspecified site of unspecified female breast: Secondary | ICD-10-CM | POA: Diagnosis not present

## 2022-04-15 DIAGNOSIS — Z5112 Encounter for antineoplastic immunotherapy: Secondary | ICD-10-CM | POA: Diagnosis not present

## 2022-04-15 DIAGNOSIS — Z171 Estrogen receptor negative status [ER-]: Secondary | ICD-10-CM | POA: Diagnosis not present

## 2022-04-16 DIAGNOSIS — W1839XA Other fall on same level, initial encounter: Secondary | ICD-10-CM | POA: Diagnosis not present

## 2022-04-16 DIAGNOSIS — G8929 Other chronic pain: Secondary | ICD-10-CM | POA: Diagnosis not present

## 2022-04-16 DIAGNOSIS — M25562 Pain in left knee: Secondary | ICD-10-CM | POA: Diagnosis not present

## 2022-04-16 DIAGNOSIS — S8002XA Contusion of left knee, initial encounter: Secondary | ICD-10-CM | POA: Diagnosis not present

## 2022-04-16 DIAGNOSIS — M1712 Unilateral primary osteoarthritis, left knee: Secondary | ICD-10-CM | POA: Diagnosis not present

## 2022-04-16 DIAGNOSIS — R2242 Localized swelling, mass and lump, left lower limb: Secondary | ICD-10-CM | POA: Diagnosis not present

## 2022-04-21 DIAGNOSIS — J383 Other diseases of vocal cords: Secondary | ICD-10-CM | POA: Diagnosis not present

## 2022-04-21 DIAGNOSIS — C787 Secondary malignant neoplasm of liver and intrahepatic bile duct: Secondary | ICD-10-CM | POA: Diagnosis not present

## 2022-04-21 DIAGNOSIS — C50912 Malignant neoplasm of unspecified site of left female breast: Secondary | ICD-10-CM | POA: Diagnosis not present

## 2022-04-21 DIAGNOSIS — C7802 Secondary malignant neoplasm of left lung: Secondary | ICD-10-CM | POA: Diagnosis not present

## 2022-04-21 DIAGNOSIS — I071 Rheumatic tricuspid insufficiency: Secondary | ICD-10-CM | POA: Diagnosis not present

## 2022-04-21 DIAGNOSIS — C7951 Secondary malignant neoplasm of bone: Secondary | ICD-10-CM | POA: Diagnosis not present

## 2022-04-21 DIAGNOSIS — C7801 Secondary malignant neoplasm of right lung: Secondary | ICD-10-CM | POA: Diagnosis not present

## 2022-04-21 DIAGNOSIS — I361 Nonrheumatic tricuspid (valve) insufficiency: Secondary | ICD-10-CM | POA: Diagnosis not present

## 2022-04-21 DIAGNOSIS — C50911 Malignant neoplasm of unspecified site of right female breast: Secondary | ICD-10-CM | POA: Diagnosis not present

## 2022-04-21 DIAGNOSIS — C786 Secondary malignant neoplasm of retroperitoneum and peritoneum: Secondary | ICD-10-CM | POA: Diagnosis not present

## 2022-04-21 DIAGNOSIS — J45991 Cough variant asthma: Secondary | ICD-10-CM | POA: Diagnosis not present

## 2022-04-21 DIAGNOSIS — J984 Other disorders of lung: Secondary | ICD-10-CM | POA: Diagnosis not present

## 2022-04-21 LAB — PULMONARY FUNCTION TEST

## 2022-04-26 ENCOUNTER — Ambulatory Visit: Payer: Medicare PPO | Admitting: Family Medicine

## 2022-04-29 DIAGNOSIS — E876 Hypokalemia: Secondary | ICD-10-CM | POA: Diagnosis not present

## 2022-04-29 DIAGNOSIS — Z5112 Encounter for antineoplastic immunotherapy: Secondary | ICD-10-CM | POA: Diagnosis not present

## 2022-04-29 DIAGNOSIS — C50919 Malignant neoplasm of unspecified site of unspecified female breast: Secondary | ICD-10-CM | POA: Diagnosis not present

## 2022-04-29 DIAGNOSIS — C799 Secondary malignant neoplasm of unspecified site: Secondary | ICD-10-CM | POA: Diagnosis not present

## 2022-04-29 DIAGNOSIS — Z79899 Other long term (current) drug therapy: Secondary | ICD-10-CM | POA: Diagnosis not present

## 2022-05-03 ENCOUNTER — Ambulatory Visit: Payer: Medicare PPO | Admitting: Family Medicine

## 2022-05-03 ENCOUNTER — Encounter: Payer: Self-pay | Admitting: Family Medicine

## 2022-05-03 VITALS — BP 120/60 | HR 60 | Ht 62.0 in | Wt 170.0 lb

## 2022-05-03 DIAGNOSIS — E119 Type 2 diabetes mellitus without complications: Secondary | ICD-10-CM

## 2022-05-03 DIAGNOSIS — R053 Chronic cough: Secondary | ICD-10-CM | POA: Diagnosis not present

## 2022-05-03 DIAGNOSIS — R490 Dysphonia: Secondary | ICD-10-CM | POA: Diagnosis not present

## 2022-05-03 MED ORDER — VICTOZA 18 MG/3ML ~~LOC~~ SOPN: PEN_INJECTOR | SUBCUTANEOUS | 1 refills | Status: DC

## 2022-05-03 MED ORDER — BENZONATATE 100 MG PO CAPS: ORAL_CAPSULE | ORAL | 0 refills | Status: DC

## 2022-05-03 NOTE — Progress Notes (Signed)
Date:  05/03/2022   Name:  Linda Vazquez   DOB:  07-30-1946   MRN:  [id number]   Chief Complaint: Diabetes and Cough (Takes tessalon perles as needed)  Diabetes She presents for her follow-up diabetic visit. She has type 2 diabetes mellitus. Her disease course has been stable. There are no hypoglycemic associated symptoms. Pertinent negatives for hypoglycemia include no dizziness, headaches or nervousness/anxiousness. Associated symptoms include weight loss. Pertinent negatives for diabetes include no blurred vision, no chest pain, no fatigue, no foot paresthesias, no polydipsia, no polyuria and no weakness. There are no hypoglycemic complications. Symptoms are stable. There are no diabetic complications. Current diabetic treatments: victoza. Her weight is fluctuating minimally. She is following a generally healthy diet. Meal planning includes avoidance of concentrated sweets and carbohydrate counting. She participates in exercise intermittently. Her breakfast blood glucose range is generally 130-140 mg/dl. An ACE inhibitor/angiotensin II receptor blocker is being taken.  Cough This is a chronic problem. The current episode started more than 1 year ago. The problem has been gradually improving. Associated symptoms include weight loss. Pertinent negatives include no chest pain, chills, ear pain, fever, headaches, hemoptysis, myalgias, nasal congestion, rash, sore throat, shortness of breath or wheezing. There is no history of environmental allergies.    Lab Results  Component Value Date   NA 138 04/26/2021   K 4.6 04/26/2021   CO2 28 (A) 04/26/2021   GLUCOSE 101 (H) 12/23/2020   BUN 30 (A) 04/26/2021   CREATININE 1.4 (A) 04/26/2021   CALCIUM 9.7 04/26/2021   EGFR 53 (L) 12/23/2020   GFRNONAA 45 (L) 04/15/2020   Lab Results  Component Value Date   CHOL 156 12/23/2021   HDL 58 12/23/2021   LDLCALC 69 12/23/2021   TRIG 176 (H) 12/23/2021   CHOLHDL 3.0 01/17/2019   Lab Results   Component Value Date   TSH 2.060 12/23/2021   Lab Results  Component Value Date   HGBA1C 6.6 (H) 12/23/2021   Lab Results  Component Value Date   WBC 6.4 04/26/2021   HGB 12.8 04/26/2021   HCT 39 04/26/2021   PLT 244 04/26/2021   Lab Results  Component Value Date   ALT 20 04/26/2021   AST 27 04/26/2021   ALKPHOS 119 04/26/2021   BILITOT 0.5 12/23/2020   No results found for: "25OHVITD2", "25OHVITD3", "VD25OH"   Review of Systems  Constitutional:  Positive for weight loss. Negative for chills, fatigue and fever.  HENT:  Negative for drooling, ear discharge, ear pain and sore throat.   Eyes:  Negative for blurred vision.  Respiratory:  Positive for cough. Negative for hemoptysis, shortness of breath and wheezing.   Cardiovascular:  Negative for chest pain, palpitations and leg swelling.  Gastrointestinal:  Negative for abdominal pain, blood in stool, constipation, diarrhea and nausea.  Endocrine: Negative for polydipsia and polyuria.  Genitourinary:  Negative for dysuria, frequency, hematuria and urgency.  Musculoskeletal:  Negative for back pain, myalgias and neck pain.  Skin:  Negative for rash.  Allergic/Immunologic: Negative for environmental allergies.  Neurological:  Negative for dizziness, weakness and headaches.  Hematological:  Does not bruise/bleed easily.  Psychiatric/Behavioral:  Negative for suicidal ideas. The patient is not nervous/anxious.     Patient Active Problem List   Diagnosis Date Noted   Chronic right shoulder pain 06/05/2020   Cervical radiculopathy 06/05/2020   Venous stasis dermatitis of left lower extremity 03/07/2019   Venous insufficiency of both lower extremities 03/07/2019   Body mass  index (BMI) 33.0-33.9, adult 03/07/2019   Type 2 diabetes mellitus without complication, without long-term current use of insulin ([place]) 01/17/2019   Nontraumatic complete tear of right rotator cuff 12/13/2018   Rotator cuff tendinitis, right 12/13/2018    Genetic testing 08/29/2017   Asthma, cough variant 05/29/2015   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   Chronic cough 10/31/2014   Esophageal dysfunction 09/23/2014   Bony exostosis 12/07/2013   BP (high blood pressure) 06/04/2013   Dysphonia 03/01/2013   Adult hypothyroidism 03/01/2013   Post-tussive emesis 03/01/2013   Malignant neoplasm of breast ([place]) 02/09/2013   Acquired lymphedema 02/09/2013   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    Allergies  Allergen Reactions   Ace Inhibitors Cough    Per pt bp med caused cough possible ace inhibitor   Sulfa Antibiotics Rash    Other Reaction: Not Assessed    Past Surgical History:  Procedure Laterality Date   BIOPSY THYROID     benign   BREAST LUMPECTOMY Right    COLONOSCOPY  2006   repeat in 10 yrs- [facility]   KNEE ARTHROSCOPY WITH MENISCAL REPAIR Bilateral    MASTECTOMY Left    MASTECTOMY Right    2 yrs later   MEDIASTINAL MASS EXCISION     SKIN CANCER EXCISION     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  Current Meds  Medication Sig   ACCU-CHEK GUIDE test strip USE TO TEST EVERY DAY   Accu-Chek Softclix Lancets lancets 100 each by Other route daily. Use as instructed/ Dx E11.9   albuterol (VENTOLIN HFA) 108 (90 Base) MCG/ACT inhaler Inhale 1-2 puffs into the lungs every 6 (six) hours as needed for wheezing or shortness of breath.   amitriptyline (ELAVIL) 10 MG tablet Take 1 tablet by mouth at bedtime as needed. Dr Rodolph Bong   atorvastatin (LIPITOR) 10 MG tablet Take 1 tablet (10 mg total) by mouth  daily.   benzonatate (TESSALON) 100 MG capsule TAKE 1 CAPSULE BY MOUTH 2 TIMES DAILY AS NEEDED.   Blood Glucose Monitoring Suppl (ACCU-CHEK GUIDE ME) w/Device KIT    CALCIUM PO Take 650 mg by mouth in the morning and at bedtime.   carbamide peroxide (DEBROX) 6.5 % OTIC solution Place 5 drops into both ears 2 (two) times daily. (Patient taking differently: Place 5 drops into both ears 2 (two) times daily as needed.)   diphenoxylate-atropine (LOMOTIL) 2.5-0.025 MG tablet Take by mouth.   hydrochlorothiazide (HYDRODIURIL) 25 MG tablet Take 1 tablet (25 mg total) by mouth daily.   Insulin Pen Needle (BD PEN NEEDLE NANO 2ND GEN) 32G X 4 MM MISC Use as directed   levothyroxine (SYNTHROID) 50 MCG tablet Take 1 tablet (50 mcg total) by mouth daily.   lidocaine-prilocaine (EMLA) cream Apply topically.   liraglutide (VICTOZA) 18 MG/3ML SOPN INJECT 0.6 MG UNDER THE SKIN ONCE DAILY   MAGNESIUM PO Take by mouth.   montelukast (SINGULAIR) 10 MG tablet Take 1 tablet (  10 mg total) by mouth at bedtime.   Multiple Vitamin (MULTIVITAMIN WITH MINERALS) TABS tablet Take 1 tablet by mouth daily.   naloxone (NARCAN) nasal spray 4 mg/0.1 mL Place into the nose.   OLANZapine (ZYPREXA) 5 MG tablet Take by mouth.   omeprazole (PRILOSEC) 40 MG capsule Take 1 capsule (40 mg total) by mouth daily.   ondansetron (ZOFRAN) 8 MG tablet Take by mouth.   prochlorperazine (COMPAZINE) 10 MG tablet Take by mouth.   SYMBICORT 160-4.5 MCG/ACT inhaler pulmonology   tretinoin (RETIN-A) 0.025 % cream Apply a pea sized amount to the entire face QHS.       05/03/2022   11:21 AM 12/23/2021   10:21 AM 12/23/2020   10:42 AM 12/03/2020    8:11 AM  GAD 7 : Generalized Anxiety Score  Nervous, Anxious, on Edge 0 0 0 0  Control/stop worrying 0 0 0 0  Worry too much - different things 0 0 0 0  Trouble relaxing 0 0 0 0  Restless 0 0 0 0  Easily annoyed or irritable 0 0 0 0  Afraid - awful might happen 0 0 0 0  Total GAD 7 Score 0 0 0 0   Anxiety Difficulty Not difficult at all          05/03/2022   11:21 AM 03/01/2022    8:36 AM 12/23/2021   10:21 AM  Depression screen PHQ 2/9  Decreased Interest 0 0 0  Down, Depressed, Hopeless 0 0 0  PHQ - 2 Score 0 0 0  Altered sleeping 0  2  Tired, decreased energy 0  0  Change in appetite 0  0  Feeling bad or failure about yourself  0  0  Trouble concentrating 0  0  Moving slowly or fidgety/restless 0  0  Suicidal thoughts 0  0  PHQ-9 Score 0  2  Difficult doing work/chores Not difficult at all  Not difficult at all    BP Readings from Last 3 Encounters:  05/03/22 120/60  12/23/21 124/80  04/27/21 (!) 142/84    Physical Exam Vitals and nursing note reviewed. Exam conducted with a chaperone present.  Constitutional:      General: She is not in acute distress.    Appearance: She is not diaphoretic.  HENT:     Head: Normocephalic and atraumatic.     Right Ear: Tympanic membrane and external ear normal.     Left Ear: Tympanic membrane and external ear normal.     Nose: Nose normal.     Mouth/Throat:     Mouth: Mucous membranes are moist.     Pharynx: No oropharyngeal exudate.  Eyes:     General:        Right eye: No discharge.        Left eye: No discharge.     Conjunctiva/sclera: Conjunctivae normal.     Pupils: Pupils are equal, round, and reactive to light.  Neck:     Thyroid: No thyromegaly.     Vascular: No JVD.  Cardiovascular:     Rate and Rhythm: Normal rate and regular rhythm.     Heart sounds: Normal heart sounds. No murmur heard.    No friction rub. No gallop.  Pulmonary:     Effort: Pulmonary effort is normal.     Breath sounds: Normal breath sounds.  Abdominal:     General: Bowel sounds are normal.     Palpations: Abdomen is soft. There is no mass.  Tenderness: There is no abdominal tenderness. There is no guarding.     Hernia: No hernia is present.  Musculoskeletal:        General: Normal range of motion.     Cervical back: Normal range  of motion and neck supple.  Lymphadenopathy:     Cervical: No cervical adenopathy.  Skin:    General: Skin is warm and dry.  Neurological:     Mental Status: She is alert.     Wt Readings from Last 3 Encounters:  05/03/22 170 lb (77.1 kg)  12/23/21 162 lb (73.5 kg)  04/27/21 186 lb (84.4 kg)    BP 120/60   Pulse 60   Ht '5\' 2"'  (1.575 m)   Wt 170 lb (77.1 kg)   BMI 31.09 kg/m   Assessment and Plan:  1. Chronic cough Chronic.  Episodic.  Tickling sensation but is controlled with Tessalon Perles 100 mg twice a day. - benzonatate (TESSALON) 100 MG capsule; TAKE 1 CAPSULE BY MOUTH 2 TIMES DAILY AS NEEDED.  Dispense: 180 capsule; Refill: 0  2. Dysphonia Dysphonia is also maintained on the benzonatate Perles 100 mg. - benzonatate (TESSALON) 100 MG capsule; TAKE 1 CAPSULE BY MOUTH 2 TIMES DAILY AS NEEDED.  Dispense: 180 capsule; Refill: 0  3. Type 2 diabetes mellitus without complication, without long-term current use of insulin (HCC) Chronic.  Controlled.  Stable.  A1c and pending results will probably continue Victoza at current dosing of 0.6 mg once a day. - HgB A1c - liraglutide (VICTOZA) 18 MG/3ML SOPN; INJECT 0.6 MG UNDER THE SKIN ONCE DAILY  Dispense: 9 mL; Refill: 1    Otilio Miu, MD

## 2022-05-04 LAB — HEMOGLOBIN A1C
Est. average glucose Bld gHb Est-mCnc: 143 mg/dL
Hgb A1c MFr Bld: 6.6 % — ABNORMAL HIGH (ref 4.8–5.6)

## 2022-05-19 ENCOUNTER — Encounter: Payer: Self-pay | Admitting: Dermatology

## 2022-05-19 ENCOUNTER — Ambulatory Visit: Payer: Medicare PPO | Admitting: Dermatology

## 2022-05-19 DIAGNOSIS — Z85828 Personal history of other malignant neoplasm of skin: Secondary | ICD-10-CM | POA: Diagnosis not present

## 2022-05-19 DIAGNOSIS — I872 Venous insufficiency (chronic) (peripheral): Secondary | ICD-10-CM

## 2022-05-19 DIAGNOSIS — L814 Other melanin hyperpigmentation: Secondary | ICD-10-CM | POA: Diagnosis not present

## 2022-05-19 DIAGNOSIS — L578 Other skin changes due to chronic exposure to nonionizing radiation: Secondary | ICD-10-CM

## 2022-05-19 DIAGNOSIS — D229 Melanocytic nevi, unspecified: Secondary | ICD-10-CM

## 2022-05-19 DIAGNOSIS — C4359 Malignant melanoma of other part of trunk: Secondary | ICD-10-CM

## 2022-05-19 DIAGNOSIS — Z85820 Personal history of malignant melanoma of skin: Secondary | ICD-10-CM | POA: Diagnosis not present

## 2022-05-19 DIAGNOSIS — L821 Other seborrheic keratosis: Secondary | ICD-10-CM

## 2022-05-19 DIAGNOSIS — L720 Epidermal cyst: Secondary | ICD-10-CM | POA: Diagnosis not present

## 2022-05-19 DIAGNOSIS — Z1283 Encounter for screening for malignant neoplasm of skin: Secondary | ICD-10-CM | POA: Diagnosis not present

## 2022-05-19 DIAGNOSIS — D1800 Hemangioma unspecified site: Secondary | ICD-10-CM

## 2022-05-19 MED ORDER — MOMETASONE FUROATE 0.1 % EX CREA: 1.0000 | TOPICAL_CREAM | CUTANEOUS | 4 refills | Status: DC

## 2022-05-19 NOTE — Patient Instructions (Signed)
Due to recent changes in healthcare laws, you may see results of your pathology and/or laboratory studies on MyChart before the doctors have had a chance to review them. We understand that in some cases there may be results that are confusing or concerning to you. Please understand that not all results are received at the same time and often the doctors may need to interpret multiple results in order to provide you with the best plan of care or course of treatment. Therefore, we ask that you please give us 2 business days to thoroughly review all your results before contacting the office for clarification. Should we see a critical lab result, you will be contacted sooner.   If You Need Anything After Your Visit  If you have any questions or concerns for your doctor, please call our main line at [phone] and press option 4 to reach your doctor's medical assistant. If no one answers, please leave a voicemail as directed and we will return your call as soon as possible. Messages left after 4 pm will be answered the following business day.   You may also send us a message via MyChart. We typically respond to MyChart messages within 1-2 business days.  For prescription refills, please ask your pharmacy to contact our office. Our fax number is [phone].  If you have an urgent issue when the clinic is closed that cannot wait until the next business day, you can page your doctor at the number below.    Please note that while we do our best to be available for urgent issues outside of office hours, we are not available 24/7.   If you have an urgent issue and are unable to reach us, you may choose to seek medical care at your doctor's office, retail clinic, urgent care center, or emergency room.  If you have a medical emergency, please immediately call 911 or go to the emergency department.  Pager Numbers  - Dr. Kowalski: [phone]  - Dr. Moye: [phone]  - Dr. Stewart:  [phone]  In the event of inclement weather, please call our main line at [phone] for an update on the status of any delays or closures.  Dermatology Medication Tips: Please keep the boxes that topical medications come in in order to help keep track of the instructions about where and how to use these. Pharmacies typically print the medication instructions only on the boxes and not directly on the medication tubes.   If your medication is too expensive, please contact our office at [phone] option 4 or send us a message through MyChart.   We are unable to tell what your co-pay for medications will be in advance as this is different depending on your insurance coverage. However, we may be able to find a substitute medication at lower cost or fill out paperwork to get insurance to cover a needed medication.   If a prior authorization is required to get your medication covered by your insurance company, please allow us 1-2 business days to complete this process.  Drug prices often vary depending on where the prescription is filled and some pharmacies may offer cheaper prices.  The website www.goodrx.com contains coupons for medications through different pharmacies. The prices here do not account for what the cost may be with help from insurance (it may be cheaper with your insurance), but the website can give you the price if you did not use any insurance.  - You can print the associated coupon and take it with   your prescription to the pharmacy.  - You may also stop by our office during regular business hours and pick up a GoodRx coupon card.  - If you need your prescription sent electronically to a different pharmacy, notify our office through [place] MyChart or by phone at [phone] option 4.     Si Usted Necesita Algo Despus de Su Visita  Tambin puede enviarnos un mensaje a travs de MyChart. Por lo general respondemos a los mensajes de MyChart en el transcurso de 1 a 2  das hbiles.  Para renovar recetas, por favor pida a su farmacia que se ponga en contacto con nuestra oficina. Nuestro nmero de fax es el [phone].  Si tiene un asunto urgente cuando la clnica est cerrada y que no puede esperar hasta el siguiente da hbil, puede llamar/localizar a su doctor(a) al nmero que aparece a continuacin.   Por favor, tenga en cuenta que aunque hacemos todo lo posible para estar disponibles para asuntos urgentes fuera del horario de oficina, no estamos disponibles las 24 horas del da, los 7 das de la semana.   Si tiene un problema urgente y no puede comunicarse con nosotros, puede optar por buscar atencin mdica  en el consultorio de su doctor(a), en una clnica privada, en un centro de atencin urgente o en una sala de emergencias.  Si tiene una emergencia mdica, por favor llame inmediatamente al 911 o vaya a la sala de emergencias.  Nmeros de bper  - Dr. Kowalski: [phone]  - Dra. Moye: [phone]  - Dra. Stewart: [phone]  En caso de inclemencias del tiempo, por favor llame a nuestra lnea principal al [phone] para una actualizacin sobre el estado de cualquier retraso o cierre.  Consejos para la medicacin en dermatologa: Por favor, guarde las cajas en las que vienen los medicamentos de uso tpico para ayudarle a seguir las instrucciones sobre dnde y cmo usarlos. Las farmacias generalmente imprimen las instrucciones del medicamento slo en las cajas y no directamente en los tubos del medicamento.   Si su medicamento es muy caro, por favor, pngase en contacto con nuestra oficina llamando al [phone] y presione la opcin 4 o envenos un mensaje a travs de MyChart.   No podemos decirle cul ser su copago por los medicamentos por adelantado ya que esto es diferente dependiendo de la cobertura de su seguro. Sin embargo, es posible que podamos encontrar un medicamento sustituto a menor costo o llenar un formulario para que el  seguro cubra el medicamento que se considera necesario.   Si se requiere una autorizacin previa para que su compaa de seguros cubra su medicamento, por favor permtanos de 1 a 2 das hbiles para completar este proceso.  Los precios de los medicamentos varan con frecuencia dependiendo del lugar de dnde se surte la receta y alguna farmacias pueden ofrecer precios ms baratos.  El sitio web www.goodrx.com tiene cupones para medicamentos de diferentes farmacias. Los precios aqu no tienen en cuenta lo que podra costar con la ayuda del seguro (puede ser ms barato con su seguro), pero el sitio web puede darle el precio si no utiliz ningn seguro.  - Puede imprimir el cupn correspondiente y llevarlo con su receta a la farmacia.  - Tambin puede pasar por nuestra oficina durante el horario de atencin regular y recoger una tarjeta de cupones de GoodRx.  - Si necesita que su receta se enve electrnicamente a una farmacia diferente, informe a nuestra oficina a travs de MyChart de [place]   o por telfono llamando al [phone] y presione la opcin 4.  

## 2022-05-19 NOTE — Progress Notes (Signed)
Follow-Up Visit   Subjective  Linda Vazquez is a 76 y.o. female who presents for the following: Annual Exam (History of Melanoma and SCC - The patient presents for Total-Body Skin Exam (TBSE) for skin cancer screening and mole check.  The patient has spots, moles and lesions to be evaluated, some may be new or changing and the patient has concerns that these could be cancer./).  The following portions of the chart were reviewed this encounter and updated as appropriate:   Tobacco  Allergies  Meds  Problems  Med Hx  Surg Hx  Fam Hx     Review of Systems:  No other skin or systemic complaints except as noted in HPI or Assessment and Plan.  Objective  Well appearing patient in no apparent distress; mood and affect are within normal limits.  A full examination was performed including scalp, head, eyes, ears, nose, lips, neck, chest, axillae, abdomen, back, buttocks, bilateral upper extremities, bilateral lower extremities, hands, feet, fingers, toes, fingernails, and toenails. All findings within normal limits unless otherwise noted below.  Left dorsum hand 0.3 cm cystic papule  Legs Stasis changes   Assessment & Plan   History of Melanoma - No evidence of recurrence today - No lymphadenopathy - Recommend regular full body skin exams - Recommend daily broad spectrum sunscreen SPF 30+ to sun-exposed areas, reapply every 2 hours as needed.  - Call if any new or changing lesions are noted between office visits  History of Squamous Cell Carcinoma of the Skin - No evidence of recurrence today - No lymphadenopathy - Recommend regular full body skin exams - Recommend daily broad spectrum sunscreen SPF 30+ to sun-exposed areas, reapply every 2 hours as needed.  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  No lymphadenopathy  today.  Lentigines - Scattered tan macules - Due to sun exposure - Benign-appearing, observe - Recommend daily broad spectrum sunscreen SPF 30+ to sun-exposed areas, reapply every 2 hours as needed. - Call for any changes  Seborrheic Keratoses - Stuck-on, waxy, tan-brown papules and/or plaques  - Benign-appearing - Discussed benign etiology and prognosis. - Observe - Call for any changes  Melanocytic Nevi - Tan-brown and/or pink-flesh-colored symmetric macules and papules - Benign appearing on exam today - Observation - Call clinic for new or changing moles - Recommend daily use of broad spectrum spf 30+ sunscreen to sun-exposed areas.   Hemangiomas - Red papules - Discussed benign nature - Observe - Call for any changes  Actinic Damage - Chronic condition, secondary to cumulative UV/sun exposure - diffuse scaly erythematous macules with underlying dyspigmentation - Recommend daily broad spectrum sunscreen SPF 30+ to sun-exposed areas, reapply every 2 hours as needed.  - Staying in the shade or wearing long sleeves, sun glasses (UVA+UVB protection) and wide brim hats (4-inch brim around the entire circumference of the hat) are also recommended for sun protection.  - Call for new or changing lesions.  Skin cancer screening performed today.  Epidermal inclusion cyst Left dorsum hand Benign, observe.  Benign-appearing. Exam most consistent with an epidermal inclusion cyst. Discussed that a cyst is a benign growth that can grow over time and sometimes get irritated or inflamed. Recommend observation if it is not bothersome. Discussed option of surgical excision to remove it if it is growing, symptomatic, or other changes noted. Please call for new  or changing lesions so they can be evaluated.  Venous stasis dermatitis of both lower extremities Legs Continue [place] Diabetic lotion daily Start Mometasone cream 3-5 days per week as needed Recommend compression socks  daily Stasis in the legs causes chronic leg swelling, which may result in itchy or painful rashes, skin discoloration, skin texture changes, and sometimes ulceration.  Recommend daily graduated compression hose/stockings- easiest to put on first thing in morning, remove at bedtime.  Elevate legs as much as possible. Avoid salt/sodium rich foods.  mometasone (ELOCON) 0.1 % cream - Legs Apply 1 Application topically as directed. 3-5 days per week as needed to affected areas of legs  Return in about 1 year (around 05/20/2023) for TBSE.  I, Ashok Cordia, CMA, am acting as scribe for Sarina Ser, MD . Documentation: I have reviewed the above documentation for accuracy and completeness, and I agree with the above.  Sarina Ser, MD

## 2022-05-20 DIAGNOSIS — C7951 Secondary malignant neoplasm of bone: Secondary | ICD-10-CM | POA: Diagnosis not present

## 2022-05-20 DIAGNOSIS — R197 Diarrhea, unspecified: Secondary | ICD-10-CM | POA: Diagnosis not present

## 2022-05-20 DIAGNOSIS — Z5111 Encounter for antineoplastic chemotherapy: Secondary | ICD-10-CM | POA: Diagnosis not present

## 2022-05-20 DIAGNOSIS — Z79899 Other long term (current) drug therapy: Secondary | ICD-10-CM | POA: Diagnosis not present

## 2022-05-20 DIAGNOSIS — C50911 Malignant neoplasm of unspecified site of right female breast: Secondary | ICD-10-CM | POA: Diagnosis not present

## 2022-05-20 DIAGNOSIS — T451X5D Adverse effect of antineoplastic and immunosuppressive drugs, subsequent encounter: Secondary | ICD-10-CM | POA: Diagnosis not present

## 2022-05-20 DIAGNOSIS — C787 Secondary malignant neoplasm of liver and intrahepatic bile duct: Secondary | ICD-10-CM | POA: Diagnosis not present

## 2022-05-20 DIAGNOSIS — C50912 Malignant neoplasm of unspecified site of left female breast: Secondary | ICD-10-CM | POA: Diagnosis not present

## 2022-05-20 DIAGNOSIS — C7800 Secondary malignant neoplasm of unspecified lung: Secondary | ICD-10-CM | POA: Diagnosis not present

## 2022-05-20 DIAGNOSIS — Z5112 Encounter for antineoplastic immunotherapy: Secondary | ICD-10-CM | POA: Diagnosis not present

## 2022-05-20 DIAGNOSIS — K521 Toxic gastroenteritis and colitis: Secondary | ICD-10-CM | POA: Diagnosis not present

## 2022-05-31 DIAGNOSIS — E119 Type 2 diabetes mellitus without complications: Secondary | ICD-10-CM | POA: Diagnosis not present

## 2022-05-31 LAB — HM DIABETES EYE EXAM

## 2022-06-10 DIAGNOSIS — Z5112 Encounter for antineoplastic immunotherapy: Secondary | ICD-10-CM | POA: Diagnosis not present

## 2022-06-10 DIAGNOSIS — K521 Toxic gastroenteritis and colitis: Secondary | ICD-10-CM | POA: Diagnosis not present

## 2022-06-10 DIAGNOSIS — C50912 Malignant neoplasm of unspecified site of left female breast: Secondary | ICD-10-CM | POA: Diagnosis not present

## 2022-06-10 DIAGNOSIS — C7951 Secondary malignant neoplasm of bone: Secondary | ICD-10-CM | POA: Diagnosis not present

## 2022-06-10 DIAGNOSIS — E876 Hypokalemia: Secondary | ICD-10-CM | POA: Diagnosis not present

## 2022-06-10 DIAGNOSIS — Z5111 Encounter for antineoplastic chemotherapy: Secondary | ICD-10-CM | POA: Diagnosis not present

## 2022-06-10 DIAGNOSIS — Z171 Estrogen receptor negative status [ER-]: Secondary | ICD-10-CM | POA: Diagnosis not present

## 2022-06-10 DIAGNOSIS — T451X5A Adverse effect of antineoplastic and immunosuppressive drugs, initial encounter: Secondary | ICD-10-CM | POA: Diagnosis not present

## 2022-06-10 DIAGNOSIS — C50911 Malignant neoplasm of unspecified site of right female breast: Secondary | ICD-10-CM | POA: Diagnosis not present

## 2022-06-10 DIAGNOSIS — Z9013 Acquired absence of bilateral breasts and nipples: Secondary | ICD-10-CM | POA: Diagnosis not present

## 2022-06-26 ENCOUNTER — Other Ambulatory Visit: Payer: Self-pay | Admitting: Family Medicine

## 2022-06-26 DIAGNOSIS — J45991 Cough variant asthma: Secondary | ICD-10-CM

## 2022-06-26 DIAGNOSIS — J301 Allergic rhinitis due to pollen: Secondary | ICD-10-CM

## 2022-06-28 NOTE — Telephone Encounter (Signed)
Requested Prescriptions  Pending Prescriptions Disp Refills  . montelukast (SINGULAIR) 10 MG tablet [Pharmacy Med Name: MONTELUKAST SOD 10 MG TABLET] 90 tablet 1    Sig: TAKE 1 TABLET BY MOUTH EVERYDAY AT BEDTIME     Pulmonology:  Leukotriene Inhibitors Passed - 06/26/2022  9:14 AM      Passed - Valid encounter within last 12 months    Recent Outpatient Visits          1 month ago Type 2 diabetes mellitus without complication, without long-term current use of insulin ([place])   [place] [facility], MD   6 months ago Type 2 diabetes mellitus without complication, without long-term current use of insulin ([place])   [place] [facility] [place], Deanna C, MD   1 year ago Bilateral malignant neoplasm of breast in female, estrogen receptor positive, unspecified site of breast ([place])   [place] [facility] at [place], Deanna C, MD   1 year ago Type 2 diabetes mellitus without complication, without long-term current use of insulin ([place])   [place] [facility] at [place], Deanna C, MD   1 year ago Intercostal pain   [place] [facility] [place], [place], MD      Future Appointments            In 2 months Juline Patch, MD [facility] at [facility]   In 11 months Ralene Bathe, MD [place]

## 2022-06-30 ENCOUNTER — Other Ambulatory Visit: Payer: Self-pay | Admitting: Family Medicine

## 2022-06-30 DIAGNOSIS — E039 Hypothyroidism, unspecified: Secondary | ICD-10-CM

## 2022-06-30 NOTE — Telephone Encounter (Signed)
Requested Prescriptions  Pending Prescriptions Disp Refills  . levothyroxine (SYNTHROID) 50 MCG tablet [Pharmacy Med Name: LEVOTHYROXINE 50 MCG TABLET] 90 tablet 1    Sig: TAKE 1 TABLET BY MOUTH EVERY DAY     Endocrinology:  Hypothyroid Agents Passed - 06/30/2022  2:11 AM      Passed - TSH in normal range and within 360 days    TSH  Date Value Ref Range Status  12/23/2021 2.060 0.450 - 4.500 uIU/mL Final         Passed - Valid encounter within last 12 months    Recent Outpatient Visits          1 month ago Type 2 diabetes mellitus without complication, without long-term current use of insulin ([place])   [facility], MD   6 months ago Type 2 diabetes mellitus without complication, without long-term current use of insulin ([place])   [facility] [place], Deanna C, MD   1 year ago Bilateral malignant neoplasm of breast in female, estrogen receptor positive, unspecified site of breast ([place])   [facility] [place], Deanna C, MD   1 year ago Type 2 diabetes mellitus without complication, without long-term current use of insulin ([place])   [facility] [place], Deanna C, MD   1 year ago Intercostal pain   [facility] [place], [place], MD      Future Appointments            In 2 months Juline Patch, MD [facility] at [facility], [facility]   In 11 months Ralene Bathe, MD [place]

## 2022-07-01 DIAGNOSIS — R112 Nausea with vomiting, unspecified: Secondary | ICD-10-CM | POA: Diagnosis not present

## 2022-07-01 DIAGNOSIS — C50912 Malignant neoplasm of unspecified site of left female breast: Secondary | ICD-10-CM | POA: Diagnosis not present

## 2022-07-01 DIAGNOSIS — E876 Hypokalemia: Secondary | ICD-10-CM | POA: Diagnosis not present

## 2022-07-01 DIAGNOSIS — Z5112 Encounter for antineoplastic immunotherapy: Secondary | ICD-10-CM | POA: Diagnosis not present

## 2022-07-01 DIAGNOSIS — C7800 Secondary malignant neoplasm of unspecified lung: Secondary | ICD-10-CM | POA: Diagnosis not present

## 2022-07-01 DIAGNOSIS — C7951 Secondary malignant neoplasm of bone: Secondary | ICD-10-CM | POA: Diagnosis not present

## 2022-07-01 DIAGNOSIS — T451X5D Adverse effect of antineoplastic and immunosuppressive drugs, subsequent encounter: Secondary | ICD-10-CM | POA: Diagnosis not present

## 2022-07-01 DIAGNOSIS — K521 Toxic gastroenteritis and colitis: Secondary | ICD-10-CM | POA: Diagnosis not present

## 2022-07-01 DIAGNOSIS — C787 Secondary malignant neoplasm of liver and intrahepatic bile duct: Secondary | ICD-10-CM | POA: Diagnosis not present

## 2022-07-01 DIAGNOSIS — T451X5A Adverse effect of antineoplastic and immunosuppressive drugs, initial encounter: Secondary | ICD-10-CM | POA: Diagnosis not present

## 2022-07-01 DIAGNOSIS — Z5111 Encounter for antineoplastic chemotherapy: Secondary | ICD-10-CM | POA: Diagnosis not present

## 2022-07-01 DIAGNOSIS — C50911 Malignant neoplasm of unspecified site of right female breast: Secondary | ICD-10-CM | POA: Diagnosis not present

## 2022-07-01 DIAGNOSIS — Z1379 Encounter for other screening for genetic and chromosomal anomalies: Secondary | ICD-10-CM | POA: Diagnosis not present

## 2022-07-02 DIAGNOSIS — R112 Nausea with vomiting, unspecified: Secondary | ICD-10-CM | POA: Insufficient documentation

## 2022-07-02 DIAGNOSIS — T451X5A Adverse effect of antineoplastic and immunosuppressive drugs, initial encounter: Secondary | ICD-10-CM | POA: Insufficient documentation

## 2022-07-04 ENCOUNTER — Other Ambulatory Visit: Payer: Self-pay | Admitting: Family Medicine

## 2022-07-04 DIAGNOSIS — K219 Gastro-esophageal reflux disease without esophagitis: Secondary | ICD-10-CM

## 2022-07-04 DIAGNOSIS — I10 Essential (primary) hypertension: Secondary | ICD-10-CM

## 2022-07-16 DIAGNOSIS — C787 Secondary malignant neoplasm of liver and intrahepatic bile duct: Secondary | ICD-10-CM | POA: Diagnosis not present

## 2022-07-16 DIAGNOSIS — C786 Secondary malignant neoplasm of retroperitoneum and peritoneum: Secondary | ICD-10-CM | POA: Diagnosis not present

## 2022-07-16 DIAGNOSIS — C50912 Malignant neoplasm of unspecified site of left female breast: Secondary | ICD-10-CM | POA: Diagnosis not present

## 2022-07-16 DIAGNOSIS — C7951 Secondary malignant neoplasm of bone: Secondary | ICD-10-CM | POA: Diagnosis not present

## 2022-07-20 DIAGNOSIS — C7800 Secondary malignant neoplasm of unspecified lung: Secondary | ICD-10-CM | POA: Diagnosis not present

## 2022-07-20 DIAGNOSIS — Z5112 Encounter for antineoplastic immunotherapy: Secondary | ICD-10-CM | POA: Diagnosis not present

## 2022-07-20 DIAGNOSIS — C787 Secondary malignant neoplasm of liver and intrahepatic bile duct: Secondary | ICD-10-CM | POA: Diagnosis not present

## 2022-07-20 DIAGNOSIS — Z171 Estrogen receptor negative status [ER-]: Secondary | ICD-10-CM | POA: Diagnosis not present

## 2022-07-20 DIAGNOSIS — E876 Hypokalemia: Secondary | ICD-10-CM | POA: Diagnosis not present

## 2022-07-20 DIAGNOSIS — C50911 Malignant neoplasm of unspecified site of right female breast: Secondary | ICD-10-CM | POA: Diagnosis not present

## 2022-07-20 DIAGNOSIS — R197 Diarrhea, unspecified: Secondary | ICD-10-CM | POA: Diagnosis not present

## 2022-07-20 DIAGNOSIS — C7951 Secondary malignant neoplasm of bone: Secondary | ICD-10-CM | POA: Diagnosis not present

## 2022-07-29 DIAGNOSIS — Z20822 Contact with and (suspected) exposure to covid-19: Secondary | ICD-10-CM | POA: Diagnosis not present

## 2022-07-29 DIAGNOSIS — R509 Fever, unspecified: Secondary | ICD-10-CM | POA: Diagnosis not present

## 2022-07-29 DIAGNOSIS — U071 COVID-19: Secondary | ICD-10-CM | POA: Diagnosis not present

## 2022-07-29 DIAGNOSIS — Z8616 Personal history of COVID-19: Secondary | ICD-10-CM | POA: Insufficient documentation

## 2022-08-04 IMAGING — CR DG RIBS 2V*L*
4 series · 4 of 4 positions shown · non-contrast
Comparison: None.

CLINICAL DATA: Rib tenderness chest pain

EXAM:
LEFT RIBS - 2 VIEW

[rib pa (1 of 2)]
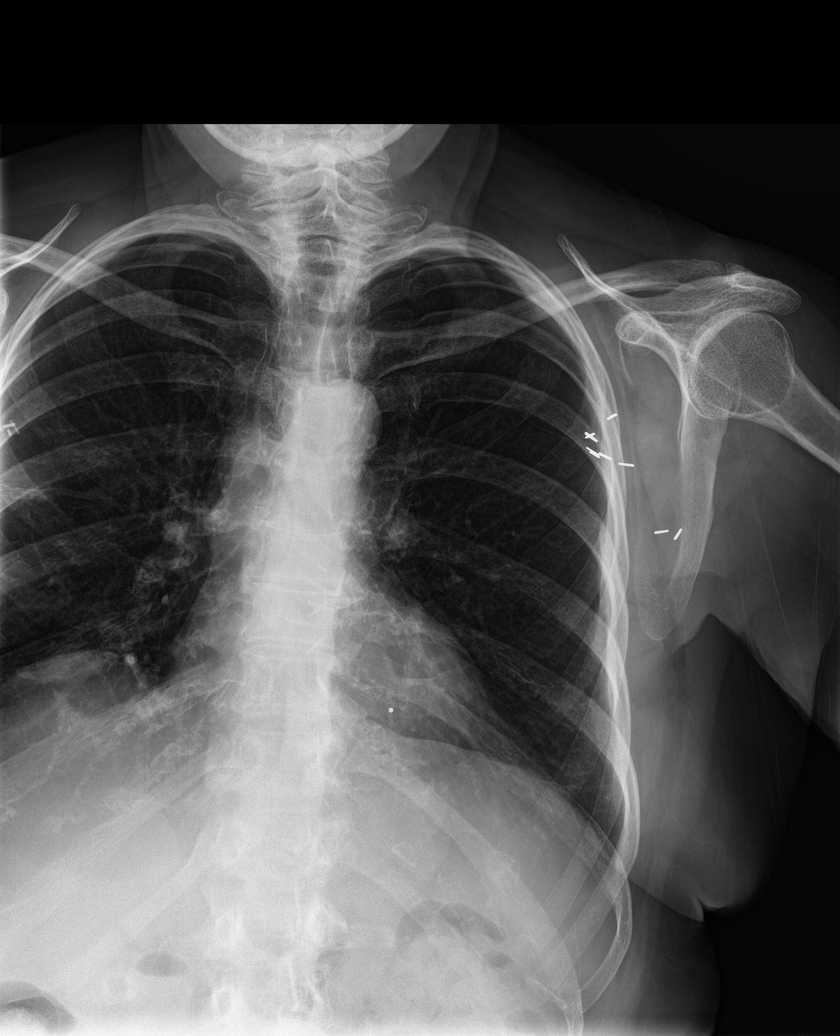

[rib pa (2 of 2)]
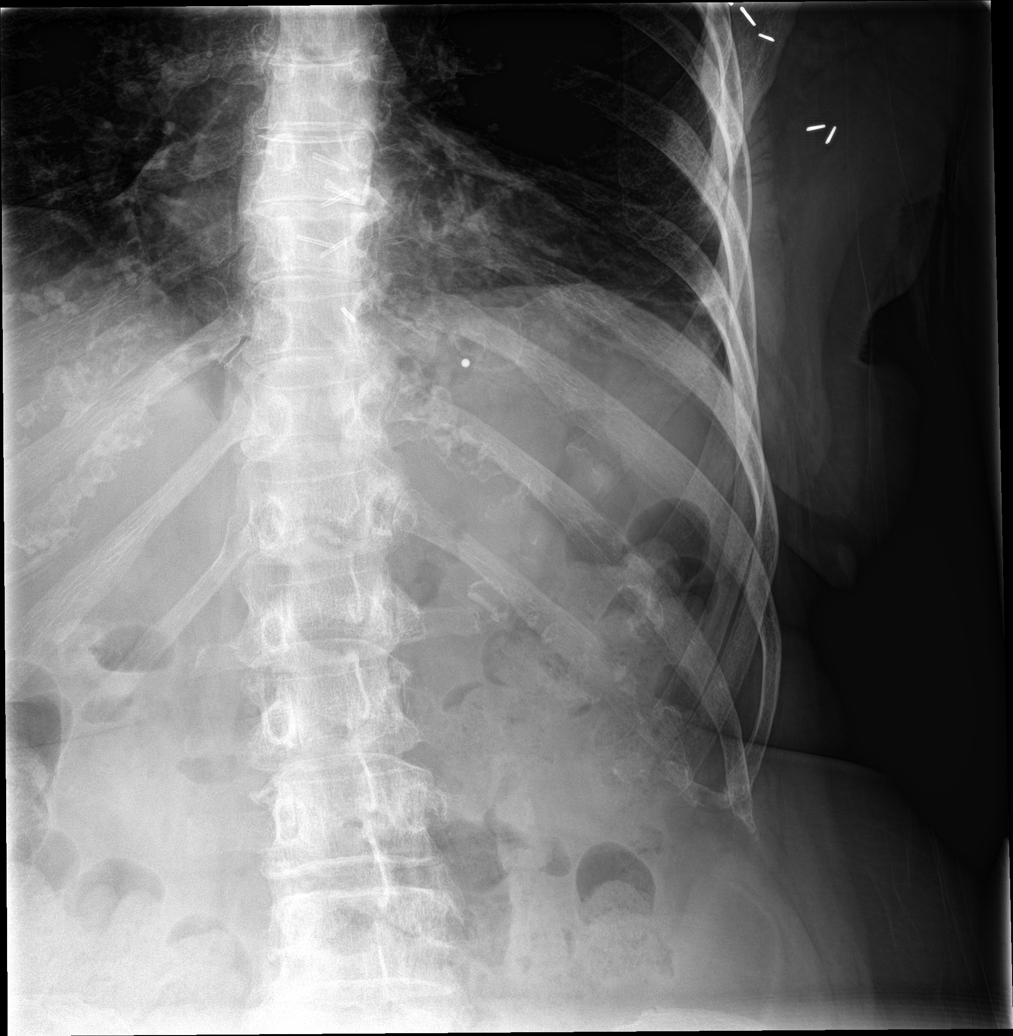

[rib obl (1 of 2)]
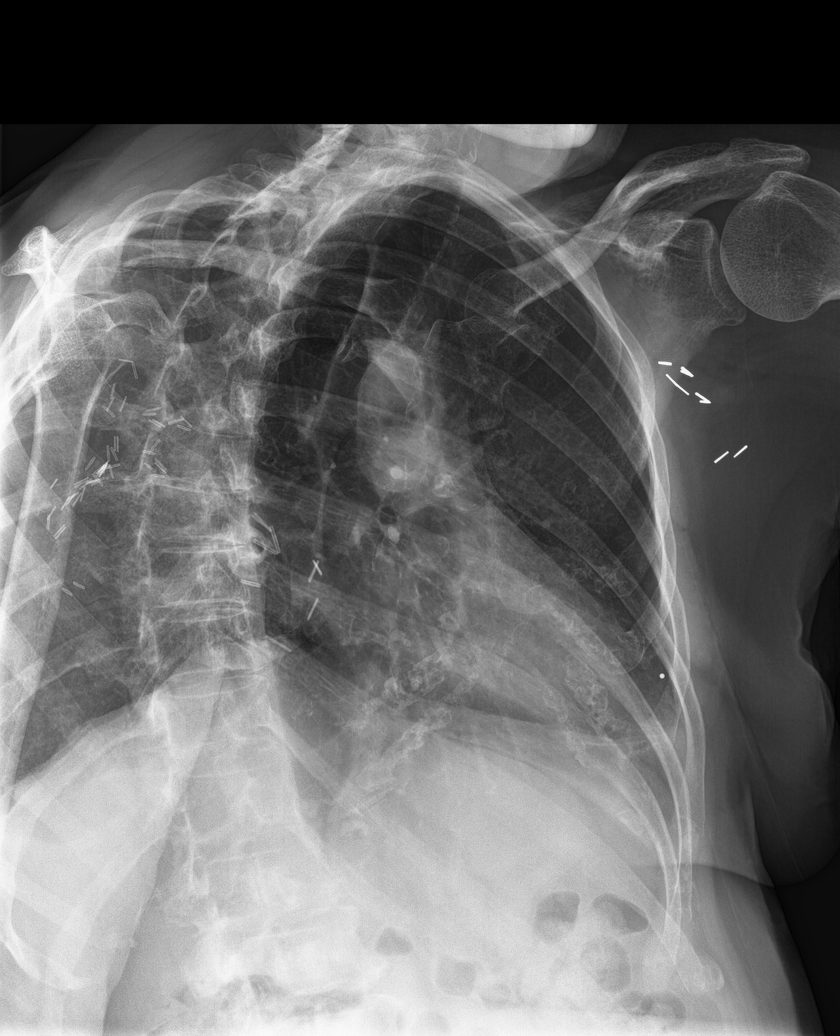

[rib obl (2 of 2)]
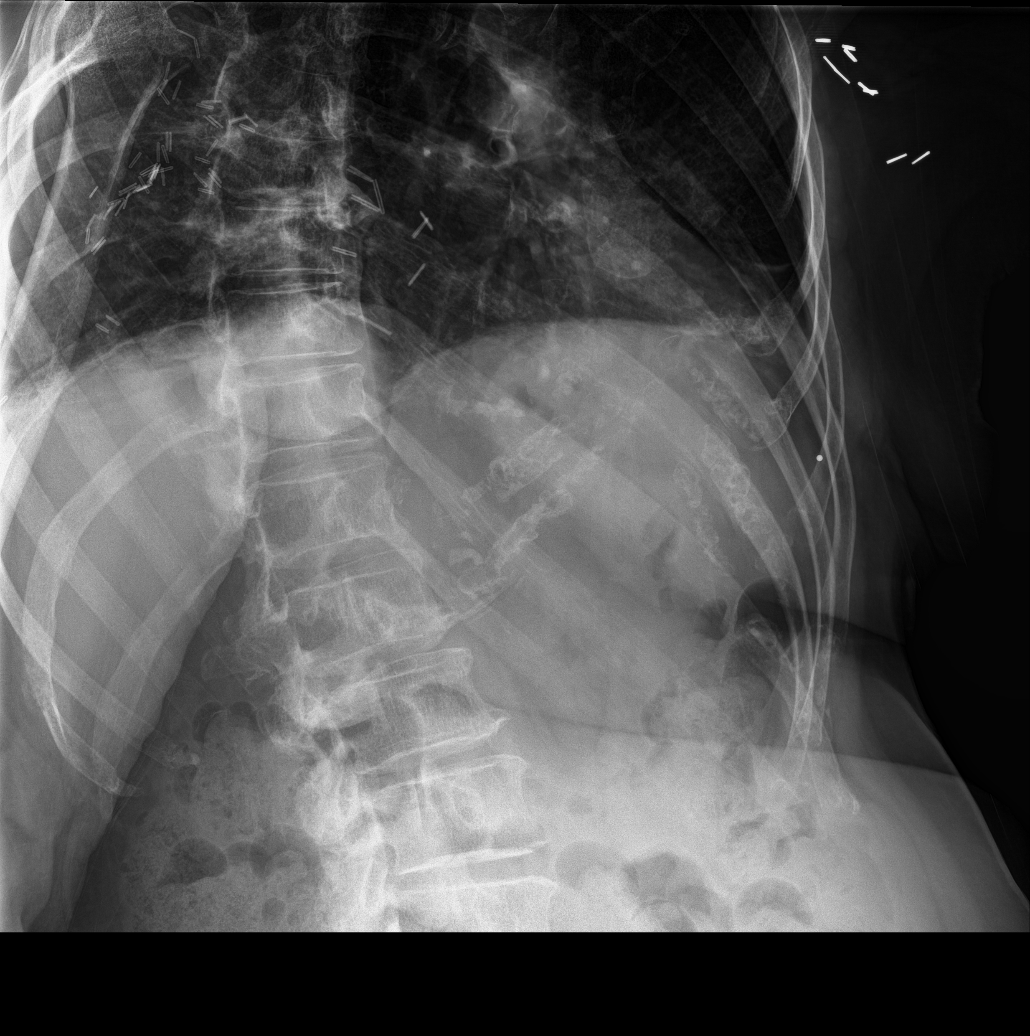

[4 of 4 positions shown; findings below may reference images not displayed]

FINDINGS: No fracture or other bone lesions are seen involving the ribs. Clips
in the left axilla.
IMPRESSION: Negative.

## 2022-08-09 DIAGNOSIS — K521 Toxic gastroenteritis and colitis: Secondary | ICD-10-CM | POA: Diagnosis not present

## 2022-08-09 DIAGNOSIS — Z79899 Other long term (current) drug therapy: Secondary | ICD-10-CM | POA: Diagnosis not present

## 2022-08-09 DIAGNOSIS — C50911 Malignant neoplasm of unspecified site of right female breast: Secondary | ICD-10-CM | POA: Diagnosis not present

## 2022-08-09 DIAGNOSIS — Z923 Personal history of irradiation: Secondary | ICD-10-CM | POA: Diagnosis not present

## 2022-08-09 DIAGNOSIS — E876 Hypokalemia: Secondary | ICD-10-CM | POA: Diagnosis not present

## 2022-08-09 DIAGNOSIS — C7951 Secondary malignant neoplasm of bone: Secondary | ICD-10-CM | POA: Diagnosis not present

## 2022-08-09 DIAGNOSIS — Z5111 Encounter for antineoplastic chemotherapy: Secondary | ICD-10-CM | POA: Diagnosis not present

## 2022-08-09 DIAGNOSIS — Z5112 Encounter for antineoplastic immunotherapy: Secondary | ICD-10-CM | POA: Diagnosis not present

## 2022-08-09 DIAGNOSIS — Z853 Personal history of malignant neoplasm of breast: Secondary | ICD-10-CM | POA: Diagnosis not present

## 2022-08-09 DIAGNOSIS — C7800 Secondary malignant neoplasm of unspecified lung: Secondary | ICD-10-CM | POA: Diagnosis not present

## 2022-08-09 DIAGNOSIS — T451X5D Adverse effect of antineoplastic and immunosuppressive drugs, subsequent encounter: Secondary | ICD-10-CM | POA: Diagnosis not present

## 2022-08-19 DIAGNOSIS — Z9013 Acquired absence of bilateral breasts and nipples: Secondary | ICD-10-CM | POA: Diagnosis not present

## 2022-08-19 DIAGNOSIS — C50111 Malignant neoplasm of central portion of right female breast: Secondary | ICD-10-CM | POA: Diagnosis not present

## 2022-08-19 DIAGNOSIS — C50112 Malignant neoplasm of central portion of left female breast: Secondary | ICD-10-CM | POA: Diagnosis not present

## 2022-09-03 DIAGNOSIS — K521 Toxic gastroenteritis and colitis: Secondary | ICD-10-CM | POA: Diagnosis not present

## 2022-09-03 DIAGNOSIS — E876 Hypokalemia: Secondary | ICD-10-CM | POA: Diagnosis not present

## 2022-09-03 DIAGNOSIS — Z171 Estrogen receptor negative status [ER-]: Secondary | ICD-10-CM | POA: Diagnosis not present

## 2022-09-03 DIAGNOSIS — Z5112 Encounter for antineoplastic immunotherapy: Secondary | ICD-10-CM | POA: Diagnosis not present

## 2022-09-03 DIAGNOSIS — Z79899 Other long term (current) drug therapy: Secondary | ICD-10-CM | POA: Diagnosis not present

## 2022-09-03 DIAGNOSIS — C50911 Malignant neoplasm of unspecified site of right female breast: Secondary | ICD-10-CM | POA: Diagnosis not present

## 2022-09-03 DIAGNOSIS — C787 Secondary malignant neoplasm of liver and intrahepatic bile duct: Secondary | ICD-10-CM | POA: Diagnosis not present

## 2022-09-03 DIAGNOSIS — C7951 Secondary malignant neoplasm of bone: Secondary | ICD-10-CM | POA: Diagnosis not present

## 2022-09-03 DIAGNOSIS — C7800 Secondary malignant neoplasm of unspecified lung: Secondary | ICD-10-CM | POA: Diagnosis not present

## 2022-09-03 DIAGNOSIS — Z5111 Encounter for antineoplastic chemotherapy: Secondary | ICD-10-CM | POA: Diagnosis not present

## 2022-09-06 ENCOUNTER — Encounter: Payer: Self-pay | Admitting: Family Medicine

## 2022-09-06 ENCOUNTER — Ambulatory Visit: Payer: Medicare PPO | Admitting: Family Medicine

## 2022-09-06 VITALS — BP 128/68 | HR 67 | Ht 62.0 in | Wt 162.0 lb

## 2022-09-06 DIAGNOSIS — I10 Essential (primary) hypertension: Secondary | ICD-10-CM

## 2022-09-06 DIAGNOSIS — E039 Hypothyroidism, unspecified: Secondary | ICD-10-CM | POA: Diagnosis not present

## 2022-09-06 DIAGNOSIS — E782 Mixed hyperlipidemia: Secondary | ICD-10-CM

## 2022-09-06 DIAGNOSIS — E119 Type 2 diabetes mellitus without complications: Secondary | ICD-10-CM | POA: Diagnosis not present

## 2022-09-06 DIAGNOSIS — J301 Allergic rhinitis due to pollen: Secondary | ICD-10-CM

## 2022-09-06 DIAGNOSIS — J45991 Cough variant asthma: Secondary | ICD-10-CM

## 2022-09-06 DIAGNOSIS — K219 Gastro-esophageal reflux disease without esophagitis: Secondary | ICD-10-CM | POA: Diagnosis not present

## 2022-09-06 MED ORDER — OMEPRAZOLE 40 MG PO CPDR: 40.0000 mg | DELAYED_RELEASE_CAPSULE | Freq: Every day | ORAL | 1 refills | Status: DC

## 2022-09-06 MED ORDER — BD PEN NEEDLE NANO 2ND GEN 32G X 4 MM MISC
1 refills | Status: AC
Start: 2022-09-06 — End: ?

## 2022-09-06 MED ORDER — VICTOZA 18 MG/3ML ~~LOC~~ SOPN: PEN_INJECTOR | SUBCUTANEOUS | 1 refills | Status: DC

## 2022-09-06 MED ORDER — LEVOTHYROXINE SODIUM 50 MCG PO TABS: 50.0000 ug | ORAL_TABLET | Freq: Every day | ORAL | 1 refills | Status: DC

## 2022-09-06 MED ORDER — HYDROCHLOROTHIAZIDE 25 MG PO TABS: 25.0000 mg | ORAL_TABLET | Freq: Every day | ORAL | 1 refills | Status: DC

## 2022-09-06 MED ORDER — MONTELUKAST SODIUM 10 MG PO TABS: ORAL_TABLET | ORAL | 1 refills | Status: DC

## 2022-09-06 MED ORDER — ATORVASTATIN CALCIUM 10 MG PO TABS: 10.0000 mg | ORAL_TABLET | Freq: Every day | ORAL | 1 refills | Status: DC

## 2022-09-06 NOTE — Progress Notes (Signed)
Date:  09/06/2022   Name:  Linda Vazquez   DOB:  09/14/1946   MRN:  [id number]   Chief Complaint: Diabetes, Hypertension, Hypothyroidism, Gastroesophageal Reflux, and Hyperlipidemia  Diabetes She presents for her follow-up diabetic visit. She has type 2 diabetes mellitus. Her disease course has been stable. There are no hypoglycemic associated symptoms. Pertinent negatives for hypoglycemia include no nervousness/anxiousness. Associated symptoms include weight loss. Pertinent negatives for diabetes include no chest pain and no fatigue. There are no hypoglycemic complications. Symptoms are stable. There are no diabetic complications. Current diabetic treatments: victoza. She is compliant with treatment all of the time. Her breakfast blood glucose is taken between 8-9 am. Her breakfast blood glucose range is generally 110-130 mg/dl.  Hypertension This is a chronic problem. The current episode started more than 1 year ago. The problem has been gradually improving since onset. Pertinent negatives include no chest pain, orthopnea, palpitations, PND or shortness of breath. Risk factors for coronary artery disease include dyslipidemia. The current treatment provides moderate improvement. There are no compliance problems.  Identifiable causes of hypertension include a thyroid problem. There is no history of chronic renal disease.  Gastroesophageal Reflux She reports no abdominal pain, no chest pain, no coughing, no dysphagia, no heartburn, no hoarse voice or no sore throat. This is a chronic problem. The problem has been gradually improving. The symptoms are aggravated by certain foods. Associated symptoms include weight loss. Pertinent negatives include no fatigue. She has tried a PPI for the symptoms. The treatment provided moderate relief. Past procedures do not include an abdominal ultrasound.  Hyperlipidemia This is a chronic problem. The current episode started more than 1 year ago. Recent lipid  tests were reviewed and are normal. She has no history of chronic renal disease, diabetes, hypothyroidism, liver disease, obesity or nephrotic syndrome. There are no known factors aggravating her hyperlipidemia. Pertinent negatives include no chest pain or shortness of breath. The current treatment provides moderate improvement of lipids. There are no compliance problems.   Thyroid Problem Presents for follow-up visit. Symptoms include diarrhea and weight loss. Patient reports no anxiety, cold intolerance, constipation, depressed mood, diaphoresis, dry skin, fatigue, hair loss, heat intolerance, hoarse voice, leg swelling, menstrual problem, nail problem, palpitations or weight gain. The symptoms have been stable. Her past medical history is significant for hyperlipidemia. There is no history of diabetes.    Lab Results  Component Value Date   NA 138 04/26/2021   K 4.6 04/26/2021   CO2 28 (A) 04/26/2021   GLUCOSE 101 (H) 12/23/2020   BUN 30 (A) 04/26/2021   CREATININE 1.4 (A) 04/26/2021   CALCIUM 9.7 04/26/2021   EGFR 53 (L) 12/23/2020   GFRNONAA 45 (L) 04/15/2020   Lab Results  Component Value Date   CHOL 156 12/23/2021   HDL 58 12/23/2021   LDLCALC 69 12/23/2021   TRIG 176 (H) 12/23/2021   CHOLHDL 3.0 01/17/2019   Lab Results  Component Value Date   TSH 2.060 12/23/2021   Lab Results  Component Value Date   HGBA1C 6.6 (H) 05/03/2022   Lab Results  Component Value Date   WBC 6.4 04/26/2021   HGB 12.8 04/26/2021   HCT 39 04/26/2021   PLT 244 04/26/2021   Lab Results  Component Value Date   ALT 20 04/26/2021   AST 27 04/26/2021   ALKPHOS 119 04/26/2021   BILITOT 0.5 12/23/2020   No results found for: "25OHVITD2", "25OHVITD3", "VD25OH"   Review of Systems  Constitutional:  Positive for weight loss. Negative for diaphoresis, fatigue and weight gain.  HENT:  Negative for hoarse voice and sore throat.   Respiratory:  Negative for cough and shortness of breath.    Cardiovascular:  Negative for chest pain, palpitations, orthopnea and PND.  Gastrointestinal:  Positive for diarrhea. Negative for abdominal pain, constipation, dysphagia and heartburn.  Endocrine: Negative for cold intolerance and heat intolerance.  Genitourinary:  Negative for menstrual problem.  Psychiatric/Behavioral:  The patient is not nervous/anxious.     Patient Active Problem List   Diagnosis Date Noted   History of 2019 novel coronavirus disease (COVID-19) 07/29/2022   Chemotherapy induced nausea and vomiting 07/02/2022   Arthritis of left knee 04/16/2022   Hypocalcemia 10/12/2021   Diarrhea due to drug 07/12/2021   Hypokalemia 06/01/2021   Chronic right shoulder pain 06/05/2020   Cervical radiculopathy 06/05/2020   Venous stasis dermatitis of left lower extremity 03/07/2019   Venous insufficiency of both lower extremities 03/07/2019   Body mass index (BMI) 33.0-33.9, adult 03/07/2019   Type 2 diabetes mellitus without complication, without long-term current use of insulin ([place]) 01/17/2019   Nontraumatic complete tear of right rotator cuff 12/13/2018   Rotator cuff tendinitis, right 12/13/2018   Genetic testing 08/29/2017   Asthma, cough variant 05/29/2015   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   Chronic cough 10/31/2014   Esophageal dysfunction 09/23/2014   Bony exostosis 12/07/2013   BP (high blood pressure) 06/04/2013   Dysphonia 03/01/2013   Adult hypothyroidism 03/01/2013   Post-tussive emesis 03/01/2013   Malignant neoplasm of breast ([place]) 02/09/2013   Acquired lymphedema 02/09/2013   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    Allergies  Allergen Reactions   Ace Inhibitors Cough    Per pt bp med caused cough possible ace inhibitor   Sulfa Antibiotics Rash    Other Reaction: Not Assessed    Past Surgical History:  Procedure Laterality Date   BIOPSY THYROID     benign   BREAST LUMPECTOMY Right    COLONOSCOPY  2006   repeat in 10 yrs- [facility]   KNEE  ARTHROSCOPY WITH MENISCAL REPAIR Bilateral    MASTECTOMY Left    MASTECTOMY Right    2 yrs later   MEDIASTINAL MASS EXCISION     SKIN CANCER EXCISION     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  Current Meds  Medication Sig   ACCU-CHEK GUIDE test strip USE TO TEST EVERY DAY   Accu-Chek Softclix Lancets lancets 100 each by Other route daily. Use as instructed/ Dx E11.9   albuterol (VENTOLIN HFA) 108 (90 Base) MCG/ACT inhaler Inhale 1-2 puffs into the lungs every 6 (six) hours as needed for wheezing or shortness of breath.   atorvastatin (LIPITOR) 10 MG tablet Take 1 tablet (10 mg total) by mouth daily.   benzonatate (TESSALON) 100 MG capsule TAKE 1 CAPSULE BY MOUTH 2 TIMES DAILY AS NEEDED.   Blood Glucose Monitoring Suppl (ACCU-CHEK GUIDE ME) w/Device  KIT    CALCIUM PO Take 650 mg by mouth in the morning and at bedtime.   carbamide peroxide (DEBROX) 6.5 % OTIC solution Place 5 drops into both ears 2 (two) times daily. (Patient taking differently: Place 5 drops into both ears 2 (two) times daily as needed.)   diphenoxylate-atropine (LOMOTIL) 2.5-0.025 MG tablet Take by mouth.   hydrochlorothiazide (HYDRODIURIL) 25 MG tablet TAKE 1 TABLET (25 MG TOTAL) BY MOUTH DAILY.   Insulin Pen Needle (BD PEN NEEDLE NANO 2ND GEN) 32G X 4 MM MISC Use as directed   levothyroxine (SYNTHROID) 50 MCG tablet TAKE 1 TABLET BY MOUTH EVERY DAY   lidocaine-prilocaine (EMLA) cream Apply topically.   liraglutide (VICTOZA) 18 MG/3ML SOPN INJECT 0.6 MG UNDER THE SKIN ONCE DAILY   MAGNESIUM PO Take  by mouth.   mometasone (ELOCON) 0.1 % cream Apply 1 Application topically as directed. 3-5 days per week as needed to affected areas of legs   montelukast (SINGULAIR) 10 MG tablet TAKE 1 TABLET BY MOUTH EVERYDAY AT BEDTIME   Multiple Vitamin (MULTIVITAMIN WITH MINERALS) TABS tablet Take 1 tablet by mouth daily.   naloxone (NARCAN) nasal spray 4 mg/0.1 mL Place into the nose.   OLANZapine (ZYPREXA) 5 MG tablet Take by mouth.   omeprazole (PRILOSEC) 40 MG capsule TAKE 1 CAPSULE (40 MG TOTAL) BY MOUTH DAILY.   ondansetron (ZOFRAN) 8 MG tablet Take by mouth.   prochlorperazine (COMPAZINE) 10 MG tablet Take by mouth.   SYMBICORT 160-4.5 MCG/ACT inhaler pulmonology   tretinoin (RETIN-A) 0.025 % cream Apply a pea sized amount to the entire face QHS.       09/06/2022   10:47 AM 05/03/2022   11:21 AM 12/23/2021   10:21 AM 12/23/2020   10:42 AM  GAD 7 : Generalized Anxiety Score  Nervous, Anxious, on Edge 0 0 0 0  Control/stop worrying 0 0 0 0  Worry too much - different things 0 0 0 0  Trouble relaxing 0 0 0 0  Restless 0 0 0 0  Easily annoyed or irritable 0 0 0 0  Afraid - awful might happen 0 0 0 0  Total GAD 7 Score 0 0 0 0  Anxiety Difficulty Not difficult at all Not difficult at all         09/06/2022   10:47 AM 05/03/2022   11:21 AM 03/01/2022    8:36 AM  Depression screen PHQ 2/9  Decreased Interest 0 0 0  Down, Depressed, Hopeless 0 0 0  PHQ - 2 Score 0 0 0  Altered sleeping 0 0   Tired, decreased energy 0 0   Change in appetite 1 0   Feeling bad or failure about yourself  0 0   Trouble concentrating 0 0   Moving slowly or fidgety/restless 0 0   Suicidal thoughts 0 0   PHQ-9 Score 1 0   Difficult doing work/chores Not difficult at all Not difficult at all     BP Readings from Last 3 Encounters:  09/06/22 128/68  05/03/22 120/60  12/23/21 124/80    Physical Exam Vitals and nursing note reviewed.  Constitutional:      Appearance: She is well-developed.  HENT:      Head: Normocephalic.     Right Ear: Tympanic membrane and external ear normal.     Left Ear: Tympanic membrane and external ear normal.     Nose: Nose normal.     Mouth/Throat:     Mouth: Mucous membranes  are moist.     Pharynx: No oropharyngeal exudate or posterior oropharyngeal erythema.  Eyes:     General: Lids are everted, no foreign bodies appreciated. No scleral icterus.       Left eye: No foreign body or hordeolum.     Conjunctiva/sclera: Conjunctivae normal.     Right eye: Right conjunctiva is not injected.     Left eye: Left conjunctiva is not injected.     Pupils: Pupils are equal, round, and reactive to light.  Neck:     Thyroid: No thyromegaly.     Vascular: No JVD.     Trachea: No tracheal deviation.  Cardiovascular:     Rate and Rhythm: Normal rate and regular rhythm.     Heart sounds: Normal heart sounds. No murmur heard.    No friction rub. No gallop.  Pulmonary:     Effort: Pulmonary effort is normal. No respiratory distress.     Breath sounds: Normal breath sounds. No wheezing, rhonchi or rales.  Abdominal:     General: Bowel sounds are normal.     Palpations: Abdomen is soft. There is no mass.     Tenderness: There is no abdominal tenderness. There is no guarding or rebound.  Musculoskeletal:        General: No tenderness. Normal range of motion.     Cervical back: Normal range of motion and neck supple.  Lymphadenopathy:     Cervical: No cervical adenopathy.  Skin:    General: Skin is warm.     Findings: No erythema or rash.  Neurological:     Mental Status: She is alert and oriented to person, place, and time.     Cranial Nerves: No cranial nerve deficit.     Deep Tendon Reflexes: Reflexes normal.  Psychiatric:        Mood and Affect: Mood is not anxious or depressed.     Wt Readings from Last 3 Encounters:  09/06/22 162 lb (73.5 kg)  05/03/22 170 lb (77.1 kg)  12/23/21 162 lb (73.5 kg)    BP 128/68   Pulse 67   Ht _0  (1.575 m)   Wt 162  lb (73.5 kg)   SpO2 96%   BMI 29.63 kg/m   Assessment and Plan:  1. Essential hypertension Chronic.  Controlled.  Stable.  Continue high chlorothiazide 25 mg once a day.  Blood pressure today is 128/68.  Patient tolerating medication well and is asymptomatic from the disease. - hydrochlorothiazide (HYDRODIURIL) 25 MG tablet; Take 1 tablet (25 mg total) by mouth daily.  Dispense: 90 tablet; Refill: 1  2. Mixed hyperlipidemia .  Controlled.  Stable.  Continue atorvastatin 10 mg once a day.  Will check lipid panel for current level of control. - atorvastatin (LIPITOR) 10 MG tablet; Take 1 tablet (10 mg total) by mouth daily.  Dispense: 90 tablet; Refill: 1 - Lipid Panel With LDL/HDL Ratio  3. Type 2 diabetes mellitus without complication, without long-term current use of insulin (HCC) Chronic.  Controlled.  Stable.  Continue Victoza 0.6 mg once a day.  Will check A1c and microalbuminuria for level of control. - Insulin Pen Needle (BD PEN NEEDLE NANO 2ND GEN) 32G X 4 MM MISC; Use as directed  Dispense: 100 each; Refill: 1 - liraglutide (VICTOZA) 18 MG/3ML SOPN; INJECT 0.6 MG UNDER THE SKIN ONCE DAILY  Dispense: 9 mL; Refill: 1 - HgB A1c - Microalbumin / creatinine urine ratio  4. Adult hypothyroidism Chronic.  Controlled.  Stable.  Asymptomatic at current dosing and will continue likely with levothyroxine 50 mg once a day. - levothyroxine (SYNTHROID) 50 MCG tablet; Take 1 tablet (50 mcg total) by mouth daily.  Dispense: 90 tablet; Refill: 1  5. Asthma, cough variant .  Controlled.  Stable.  Continue Singulair 10 mg once a day. - montelukast (SINGULAIR) 10 MG tablet; TAKE 1 TABLET BY MOUTH EVERYDAY AT BEDTIME  Dispense: 90 tablet; Refill: 1  6. Gastroesophageal reflux disease .  Controlled.  Stable.  Continue omeprazole 40 mg once a day. - omeprazole (PRILOSEC) 40 MG capsule; Take 1 capsule (40 mg total) by mouth daily.  Dispense: 90 capsule; Refill: 1  7. Chronic allergic rhinitis due  to pollen .  Controlled.  Stable.  Continue Singulair 10 mg once a day. - montelukast (SINGULAIR) 10 MG tablet; TAKE 1 TABLET BY MOUTH EVERYDAY AT BEDTIME  Dispense: 90 tablet; Refill: 1    Otilio Miu, MD

## 2022-09-07 LAB — MICROALBUMIN / CREATININE URINE RATIO
Creatinine, Urine: 59.7 mg/dL
Microalb/Creat Ratio: 21 mg/g creat (ref 0–29)
Microalbumin, Urine: 12.4 ug/mL

## 2022-09-07 LAB — LIPID PANEL WITH LDL/HDL RATIO
Cholesterol, Total: 181 mg/dL (ref 100–199)
HDL: 74 mg/dL (ref >39)
LDL Chol Calc (NIH): 82 mg/dL (ref 0–99)
LDL/HDL Ratio: 1.1 ratio (ref 0.0–3.2)
Triglycerides: 146 mg/dL (ref 0–149)
VLDL Cholesterol Cal: 25 mg/dL (ref 5–40)

## 2022-09-07 LAB — HEMOGLOBIN A1C
Est. average glucose Bld gHb Est-mCnc: 160 mg/dL
Hgb A1c MFr Bld: 7.2 % — ABNORMAL HIGH (ref 4.8–5.6)

## 2022-09-22 DIAGNOSIS — C50911 Malignant neoplasm of unspecified site of right female breast: Secondary | ICD-10-CM | POA: Diagnosis not present

## 2022-09-22 DIAGNOSIS — Z5112 Encounter for antineoplastic immunotherapy: Secondary | ICD-10-CM | POA: Diagnosis not present

## 2022-09-22 DIAGNOSIS — Z853 Personal history of malignant neoplasm of breast: Secondary | ICD-10-CM | POA: Diagnosis not present

## 2022-09-22 DIAGNOSIS — E876 Hypokalemia: Secondary | ICD-10-CM | POA: Diagnosis not present

## 2022-09-22 DIAGNOSIS — Z5111 Encounter for antineoplastic chemotherapy: Secondary | ICD-10-CM | POA: Diagnosis not present

## 2022-09-22 DIAGNOSIS — C7800 Secondary malignant neoplasm of unspecified lung: Secondary | ICD-10-CM | POA: Diagnosis not present

## 2022-09-22 DIAGNOSIS — C7951 Secondary malignant neoplasm of bone: Secondary | ICD-10-CM | POA: Diagnosis not present

## 2022-09-22 DIAGNOSIS — R110 Nausea: Secondary | ICD-10-CM | POA: Diagnosis not present

## 2022-09-22 DIAGNOSIS — C787 Secondary malignant neoplasm of liver and intrahepatic bile duct: Secondary | ICD-10-CM | POA: Diagnosis not present

## 2022-10-12 DIAGNOSIS — C7951 Secondary malignant neoplasm of bone: Secondary | ICD-10-CM | POA: Diagnosis not present

## 2022-10-12 DIAGNOSIS — C787 Secondary malignant neoplasm of liver and intrahepatic bile duct: Secondary | ICD-10-CM | POA: Diagnosis not present

## 2022-10-12 DIAGNOSIS — I2699 Other pulmonary embolism without acute cor pulmonale: Secondary | ICD-10-CM | POA: Diagnosis not present

## 2022-10-12 DIAGNOSIS — C50919 Malignant neoplasm of unspecified site of unspecified female breast: Secondary | ICD-10-CM | POA: Diagnosis not present

## 2022-10-12 DIAGNOSIS — C786 Secondary malignant neoplasm of retroperitoneum and peritoneum: Secondary | ICD-10-CM | POA: Diagnosis not present

## 2022-10-13 DIAGNOSIS — E876 Hypokalemia: Secondary | ICD-10-CM | POA: Diagnosis not present

## 2022-10-13 DIAGNOSIS — K521 Toxic gastroenteritis and colitis: Secondary | ICD-10-CM | POA: Diagnosis not present

## 2022-10-13 DIAGNOSIS — Z5111 Encounter for antineoplastic chemotherapy: Secondary | ICD-10-CM | POA: Diagnosis not present

## 2022-10-13 DIAGNOSIS — Z86711 Personal history of pulmonary embolism: Secondary | ICD-10-CM | POA: Diagnosis not present

## 2022-10-13 DIAGNOSIS — C799 Secondary malignant neoplasm of unspecified site: Secondary | ICD-10-CM | POA: Diagnosis not present

## 2022-10-13 DIAGNOSIS — C7951 Secondary malignant neoplasm of bone: Secondary | ICD-10-CM | POA: Diagnosis not present

## 2022-10-13 DIAGNOSIS — Z5112 Encounter for antineoplastic immunotherapy: Secondary | ICD-10-CM | POA: Diagnosis not present

## 2022-10-13 DIAGNOSIS — R9389 Abnormal findings on diagnostic imaging of other specified body structures: Secondary | ICD-10-CM | POA: Diagnosis not present

## 2022-10-13 DIAGNOSIS — C50912 Malignant neoplasm of unspecified site of left female breast: Secondary | ICD-10-CM | POA: Diagnosis not present

## 2022-10-13 DIAGNOSIS — C50911 Malignant neoplasm of unspecified site of right female breast: Secondary | ICD-10-CM | POA: Diagnosis not present

## 2022-10-13 DIAGNOSIS — Z79899 Other long term (current) drug therapy: Secondary | ICD-10-CM | POA: Diagnosis not present

## 2022-10-26 ENCOUNTER — Other Ambulatory Visit: Payer: Self-pay

## 2022-10-26 DIAGNOSIS — E119 Type 2 diabetes mellitus without complications: Secondary | ICD-10-CM

## 2022-10-26 NOTE — Progress Notes (Signed)
A1C printed

## 2022-10-27 ENCOUNTER — Other Ambulatory Visit: Payer: Self-pay

## 2022-10-27 DIAGNOSIS — E119 Type 2 diabetes mellitus without complications: Secondary | ICD-10-CM

## 2022-10-27 LAB — HEMOGLOBIN A1C
Est. average glucose Bld gHb Est-mCnc: 160 mg/dL
Hgb A1c MFr Bld: 7.2 % — ABNORMAL HIGH (ref 4.8–5.6)

## 2022-10-27 MED ORDER — VICTOZA 18 MG/3ML ~~LOC~~ SOPN: PEN_INJECTOR | SUBCUTANEOUS | 1 refills | Status: DC

## 2022-11-03 DIAGNOSIS — E876 Hypokalemia: Secondary | ICD-10-CM | POA: Diagnosis not present

## 2022-11-03 DIAGNOSIS — C7801 Secondary malignant neoplasm of right lung: Secondary | ICD-10-CM | POA: Diagnosis not present

## 2022-11-03 DIAGNOSIS — Z5111 Encounter for antineoplastic chemotherapy: Secondary | ICD-10-CM | POA: Diagnosis not present

## 2022-11-03 DIAGNOSIS — I2699 Other pulmonary embolism without acute cor pulmonale: Secondary | ICD-10-CM | POA: Diagnosis not present

## 2022-11-03 DIAGNOSIS — C50911 Malignant neoplasm of unspecified site of right female breast: Secondary | ICD-10-CM | POA: Diagnosis not present

## 2022-11-03 DIAGNOSIS — Z5112 Encounter for antineoplastic immunotherapy: Secondary | ICD-10-CM | POA: Diagnosis not present

## 2022-11-03 DIAGNOSIS — R197 Diarrhea, unspecified: Secondary | ICD-10-CM | POA: Diagnosis not present

## 2022-11-03 DIAGNOSIS — C7951 Secondary malignant neoplasm of bone: Secondary | ICD-10-CM | POA: Diagnosis not present

## 2022-11-03 DIAGNOSIS — C787 Secondary malignant neoplasm of liver and intrahepatic bile duct: Secondary | ICD-10-CM | POA: Diagnosis not present

## 2022-11-10 DIAGNOSIS — J385 Laryngeal spasm: Secondary | ICD-10-CM | POA: Diagnosis not present

## 2022-11-24 DIAGNOSIS — C7951 Secondary malignant neoplasm of bone: Secondary | ICD-10-CM | POA: Diagnosis not present

## 2022-11-24 DIAGNOSIS — C786 Secondary malignant neoplasm of retroperitoneum and peritoneum: Secondary | ICD-10-CM | POA: Diagnosis not present

## 2022-11-24 DIAGNOSIS — Z5112 Encounter for antineoplastic immunotherapy: Secondary | ICD-10-CM | POA: Diagnosis not present

## 2022-11-24 DIAGNOSIS — E876 Hypokalemia: Secondary | ICD-10-CM | POA: Diagnosis not present

## 2022-11-24 DIAGNOSIS — C50911 Malignant neoplasm of unspecified site of right female breast: Secondary | ICD-10-CM | POA: Diagnosis not present

## 2022-11-24 DIAGNOSIS — I2699 Other pulmonary embolism without acute cor pulmonale: Secondary | ICD-10-CM | POA: Diagnosis not present

## 2022-11-24 DIAGNOSIS — Z171 Estrogen receptor negative status [ER-]: Secondary | ICD-10-CM | POA: Diagnosis not present

## 2022-11-24 DIAGNOSIS — C787 Secondary malignant neoplasm of liver and intrahepatic bile duct: Secondary | ICD-10-CM | POA: Diagnosis not present

## 2022-11-24 DIAGNOSIS — C50912 Malignant neoplasm of unspecified site of left female breast: Secondary | ICD-10-CM | POA: Diagnosis not present

## 2022-11-24 DIAGNOSIS — C50919 Malignant neoplasm of unspecified site of unspecified female breast: Secondary | ICD-10-CM | POA: Diagnosis not present

## 2022-11-24 DIAGNOSIS — C7800 Secondary malignant neoplasm of unspecified lung: Secondary | ICD-10-CM | POA: Diagnosis not present

## 2022-11-24 DIAGNOSIS — Z5111 Encounter for antineoplastic chemotherapy: Secondary | ICD-10-CM | POA: Diagnosis not present

## 2022-11-29 DIAGNOSIS — C50919 Malignant neoplasm of unspecified site of unspecified female breast: Secondary | ICD-10-CM | POA: Diagnosis not present

## 2022-11-29 DIAGNOSIS — R9389 Abnormal findings on diagnostic imaging of other specified body structures: Secondary | ICD-10-CM | POA: Diagnosis not present

## 2022-11-29 DIAGNOSIS — N8500 Endometrial hyperplasia, unspecified: Secondary | ICD-10-CM | POA: Diagnosis not present

## 2022-12-15 DIAGNOSIS — Z853 Personal history of malignant neoplasm of breast: Secondary | ICD-10-CM | POA: Diagnosis not present

## 2022-12-15 DIAGNOSIS — E876 Hypokalemia: Secondary | ICD-10-CM | POA: Diagnosis not present

## 2022-12-15 DIAGNOSIS — Z5111 Encounter for antineoplastic chemotherapy: Secondary | ICD-10-CM | POA: Diagnosis not present

## 2022-12-15 DIAGNOSIS — C787 Secondary malignant neoplasm of liver and intrahepatic bile duct: Secondary | ICD-10-CM | POA: Diagnosis not present

## 2022-12-15 DIAGNOSIS — C7800 Secondary malignant neoplasm of unspecified lung: Secondary | ICD-10-CM | POA: Diagnosis not present

## 2022-12-15 DIAGNOSIS — C50911 Malignant neoplasm of unspecified site of right female breast: Secondary | ICD-10-CM | POA: Diagnosis not present

## 2022-12-15 DIAGNOSIS — C50912 Malignant neoplasm of unspecified site of left female breast: Secondary | ICD-10-CM | POA: Diagnosis not present

## 2022-12-15 DIAGNOSIS — R197 Diarrhea, unspecified: Secondary | ICD-10-CM | POA: Diagnosis not present

## 2022-12-15 DIAGNOSIS — Z5112 Encounter for antineoplastic immunotherapy: Secondary | ICD-10-CM | POA: Diagnosis not present

## 2022-12-15 DIAGNOSIS — R9389 Abnormal findings on diagnostic imaging of other specified body structures: Secondary | ICD-10-CM | POA: Diagnosis not present

## 2022-12-15 DIAGNOSIS — C7951 Secondary malignant neoplasm of bone: Secondary | ICD-10-CM | POA: Diagnosis not present

## 2023-01-03 ENCOUNTER — Encounter: Payer: Self-pay | Admitting: Family Medicine

## 2023-01-03 ENCOUNTER — Ambulatory Visit: Payer: Medicare PPO | Admitting: Family Medicine

## 2023-01-03 VITALS — BP 108/72 | HR 69 | Ht 62.0 in | Wt 154.0 lb

## 2023-01-03 DIAGNOSIS — E119 Type 2 diabetes mellitus without complications: Secondary | ICD-10-CM

## 2023-01-03 DIAGNOSIS — R490 Dysphonia: Secondary | ICD-10-CM | POA: Diagnosis not present

## 2023-01-03 DIAGNOSIS — R053 Chronic cough: Secondary | ICD-10-CM | POA: Diagnosis not present

## 2023-01-03 MED ORDER — BENZONATATE 100 MG PO CAPS: ORAL_CAPSULE | ORAL | 3 refills | Status: DC

## 2023-01-03 MED ORDER — VICTOZA 18 MG/3ML ~~LOC~~ SOPN
PEN_INJECTOR | SUBCUTANEOUS | 1 refills | Status: DC
Start: 2023-01-03 — End: 2023-01-17

## 2023-01-03 MED ORDER — BENZONATATE 100 MG PO CAPS
ORAL_CAPSULE | ORAL | 0 refills | Status: DC
Start: 2023-01-03 — End: 2023-01-03

## 2023-01-03 NOTE — Progress Notes (Signed)
Date:  01/03/2023   Name:  Linda Vazquez   DOB:  04/03/1946   MRN:  [id number]   Chief Complaint: Cough and Diabetes  Cough This is a recurrent problem. The current episode started more than 1 year ago. The problem has been gradually improving. The problem occurs hourly. The cough is Non-productive. Associated symptoms include postnasal drip and rhinorrhea. Pertinent negatives include no chest pain, chills, ear pain, fever, hemoptysis, myalgias, nasal congestion, sore throat, shortness of breath, weight loss or wheezing. Nothing aggravates the symptoms. She has tried prescription cough suppressant (tessalon perles) for the symptoms. The treatment provided moderate relief.  Diabetes She presents for her follow-up diabetic visit. She has type 2 diabetes mellitus. Her disease course has been stable. There are no hypoglycemic associated symptoms. Associated symptoms include blurred vision, foot paresthesias and visual change. Pertinent negatives for diabetes include no chest pain, no fatigue, no polydipsia, no polyuria, no weakness and no weight loss. There are no hypoglycemic complications. Symptoms are worsening. Diabetic complications include peripheral neuropathy. (But likely chemotherapy) Current diabetic treatments: victoza. She is following a generally healthy diet. Meal planning includes avoidance of concentrated sweets and carbohydrate counting. Her breakfast blood glucose is taken between 8-9 am. An ACE inhibitor/angiotensin II receptor blocker is being taken.    Lab Results  Component Value Date   NA 138 04/26/2021   K 4.6 04/26/2021   CO2 28 (A) 04/26/2021   GLUCOSE 101 (H) 12/23/2020   BUN 30 (A) 04/26/2021   CREATININE 1.4 (A) 04/26/2021   CALCIUM 9.7 04/26/2021   EGFR 53 (L) 12/23/2020   GFRNONAA 45 (L) 04/15/2020   Lab Results  Component Value Date   CHOL 181 09/06/2022   HDL 74 09/06/2022   LDLCALC 82 09/06/2022   TRIG 146 09/06/2022   CHOLHDL 3.0 01/17/2019   Lab  Results  Component Value Date   TSH 2.060 12/23/2021   Lab Results  Component Value Date   HGBA1C 7.2 (H) 10/26/2022   Lab Results  Component Value Date   WBC 6.4 04/26/2021   HGB 12.8 04/26/2021   HCT 39 04/26/2021   PLT 244 04/26/2021   Lab Results  Component Value Date   ALT 20 04/26/2021   AST 27 04/26/2021   ALKPHOS 119 04/26/2021   BILITOT 0.5 12/23/2020   No results found for: "25OHVITD2", "25OHVITD3", "VD25OH"   Review of Systems  Constitutional:  Negative for chills, fatigue, fever and weight loss.  HENT:  Positive for postnasal drip and rhinorrhea. Negative for ear pain and sore throat.   Eyes:  Positive for blurred vision. Negative for visual disturbance.  Respiratory:  Positive for cough. Negative for hemoptysis, chest tightness, shortness of breath and wheezing.   Cardiovascular:  Negative for chest pain and leg swelling.  Gastrointestinal:  Positive for diarrhea. Negative for abdominal pain and blood in stool.  Endocrine: Negative for polydipsia and polyuria.  Musculoskeletal:  Negative for myalgias.  Neurological:  Negative for weakness.    Patient Active Problem List   Diagnosis Date Noted   History of 2019 novel coronavirus disease (COVID-19) 07/29/2022   Chemotherapy induced nausea and vomiting 07/02/2022   Arthritis of left knee 04/16/2022   Hypocalcemia 10/12/2021   Diarrhea due to drug 07/12/2021   Hypokalemia 06/01/2021   Chronic right shoulder pain 06/05/2020   Cervical radiculopathy 06/05/2020   Venous stasis dermatitis of left lower extremity 03/07/2019   Venous insufficiency of both lower extremities 03/07/2019   Body mass index (BMI)  33.0-33.9, adult 03/07/2019   Type 2 diabetes mellitus without complication, without long-term current use of insulin 01/17/2019   Nontraumatic complete tear of right rotator cuff 12/13/2018   Rotator cuff tendinitis, right 12/13/2018   Genetic testing 08/29/2017   Asthma, cough variant 05/29/2015    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   Chronic cough 10/31/2014   Esophageal dysfunction 09/23/2014   Bony exostosis 12/07/2013   BP (high blood pressure) 06/04/2013   Dysphonia 03/01/2013   Adult hypothyroidism 03/01/2013   Post-tussive emesis 03/01/2013   Malignant neoplasm of breast 02/09/2013   Acquired lymphedema 02/09/2013   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    Allergies  Allergen Reactions   Ace Inhibitors Cough    Per pt bp med caused cough possible ace inhibitor   Sulfa Antibiotics Rash    Other Reaction: Not Assessed    Past Surgical History:  Procedure Laterality Date   BIOPSY THYROID     benign   BREAST LUMPECTOMY Right    COLONOSCOPY  2006   repeat in 10 yrs- [facility] Dr   KNEE ARTHROSCOPY WITH MENISCAL REPAIR Bilateral    MASTECTOMY Left    MASTECTOMY Right    2 yrs later   MEDIASTINAL MASS EXCISION     SKIN CANCER EXCISION     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  Current Meds  Medication Sig   ACCU-CHEK GUIDE test strip USE TO TEST EVERY DAY   Accu-Chek Softclix Lancets lancets 100 each by Other route daily. Use as instructed/ Dx E11.9   albuterol (VENTOLIN HFA) 108 (90 Base) MCG/ACT inhaler Inhale 1-2 puffs into the lungs every 6 (six) hours as needed for wheezing or shortness of breath.   amitriptyline (ELAVIL) 10 MG tablet Take 1 tablet by mouth at bedtime as needed. Dr Rica Records   atorvastatin (LIPITOR) 10 MG tablet Take 1 tablet (10 mg total) by mouth daily.   benzonatate (TESSALON) 100 MG capsule TAKE 1 CAPSULE BY MOUTH 2  TIMES DAILY AS NEEDED.   Blood Glucose Monitoring Suppl (ACCU-CHEK GUIDE ME) w/Device KIT    CALCIUM PO Take 650 mg by mouth in the morning and at bedtime.   carbamide peroxide (DEBROX) 6.5 % OTIC solution Place 5 drops into both ears 2 (two) times daily. (Patient taking differently: Place 5 drops into both ears 2 (two) times daily as needed.)   diphenoxylate-atropine (LOMOTIL) 2.5-0.025 MG tablet Take by mouth.   ELIQUIS DVT/PE STARTER PACK 5 mg 2 (two) times daily.   gabapentin (NEURONTIN) 300 MG/6ML solution Take 300 mg by mouth.   hydrochlorothiazide (HYDRODIURIL) 25 MG tablet Take 1 tablet (25 mg total) by mouth daily.   Insulin Pen Needle (BD PEN NEEDLE NANO 2ND GEN) 32G X 4 MM MISC Use as directed   levothyroxine (SYNTHROID) 50 MCG tablet Take 1 tablet (50 mcg total) by mouth daily.   lidocaine-prilocaine (EMLA) cream Apply topically.   liraglutide (VICTOZA) 18 MG/3ML SOPN INJECT 1.2 MG UNDER THE  SKIN ONCE DAILY   MAGNESIUM PO Take by mouth.   mometasone (ELOCON) 0.1 % cream Apply 1 Application topically as directed. 3-5 days per week as needed to affected areas of legs   montelukast (SINGULAIR) 10 MG tablet TAKE 1 TABLET BY MOUTH EVERYDAY AT BEDTIME   Multiple Vitamin (MULTIVITAMIN WITH MINERALS) TABS tablet Take 1 tablet by mouth daily.   naloxone (NARCAN) nasal spray 4 mg/0.1 mL Place into the nose.   OLANZapine (ZYPREXA) 5 MG tablet Take by mouth.   omeprazole (PRILOSEC) 40 MG capsule Take 1 capsule (40 mg total) by mouth daily.   ondansetron (ZOFRAN) 8 MG tablet Take by mouth.   prochlorperazine (COMPAZINE) 10 MG tablet Take by mouth.   SYMBICORT 160-4.5 MCG/ACT inhaler pulmonology   tretinoin (RETIN-A) 0.025 % cream Apply a pea sized amount to the entire face QHS.       01/03/2023   10:31 AM 09/06/2022   10:47 AM 05/03/2022   11:21 AM 12/23/2021   10:21 AM  GAD 7 : Generalized Anxiety Score  Nervous, Anxious, on Edge 0 0 0 0  Control/stop worrying 0 0 0 0  Worry too much -  different things 0 0 0 0  Trouble relaxing 0 0 0 0  Restless 0 0 0 0  Easily annoyed or irritable 0 0 0 0  Afraid - awful might happen 0 0 0 0  Total GAD 7 Score 0 0 0 0  Anxiety Difficulty Not difficult at all Not difficult at all Not difficult at all        01/03/2023   10:31 AM 09/06/2022   10:47 AM 05/03/2022   11:21 AM  Depression screen PHQ 2/9  Decreased Interest 0 0 0  Down, Depressed, Hopeless 0 0 0  PHQ - 2 Score 0 0 0  Altered sleeping 0 0 0  Tired, decreased energy 0 0 0  Change in appetite 0 1 0  Feeling bad or failure about yourself  0 0 0  Trouble concentrating 0 0 0  Moving slowly or fidgety/restless 0 0 0  Suicidal thoughts 0 0 0  PHQ-9 Score 0 1 0  Difficult doing work/chores Not difficult at all Not difficult at all Not difficult at all    BP Readings from Last 3 Encounters:  01/03/23 108/72  09/06/22 128/68  05/03/22 120/60    Physical Exam Vitals and nursing note reviewed.  HENT:     Head: Normocephalic.     Right Ear: Tympanic membrane normal.     Left Ear: Tympanic membrane normal.     Nose: Nose normal.     Mouth/Throat:     Mouth: Mucous membranes are moist.  Eyes:     Pupils: Pupils are equal, round, and reactive to light.  Cardiovascular:     Rate and Rhythm: Normal rate and regular rhythm.     Heart sounds: No murmur heard.    No friction rub. No gallop.  Pulmonary:     Breath sounds: No wheezing, rhonchi or rales.  Abdominal:     General: Abdomen is flat.     Tenderness: There is no abdominal tenderness.  Musculoskeletal:     Cervical back: Neck supple.  Neurological:     Mental Status: She is alert.     Wt Readings from Last 3 Encounters:  01/03/23 154 lb (69.9 kg)  09/06/22 162 lb (73.5 kg)  05/03/22 170 lb (77.1 kg)    BP 108/72   Pulse 69   Ht  5\' 2"  (1.575 m)   Wt 154 lb (69.9 kg)   SpO2 95%   BMI 28.17 kg/m   Assessment and Plan:  1. Type 2 diabetes mellitus without complication, without long-term current  use of insulin Chronic.  Controlled.  Stable.  Last visit necessitated just increasing Victoza to 1.2 once a day.  Patient is doing relatively well.  Will check A1c for current level of control. - liraglutide (VICTOZA) 18 MG/3ML SOPN; INJECT 1.2 MG UNDER THE SKIN ONCE DAILY  Dispense: 9 mL; Refill: 1 - HgB A1c  2. Chronic cough Chronic.  Controlled.  Stable.  Patient has resolution with Tessalon Perles. - benzonatate (TESSALON) 100 MG capsule; TAKE 1 CAPSULE BY MOUTH 2 TIMES DAILY AS NEEDED.  Dispense: 180 capsule; Refill: 0  3. Dysphonia Chronic.  Controlled.  Stable.  Patient has resolution with Tessalon Perles. - benzonatate (TESSALON) 100 MG capsule; TAKE 1 CAPSULE BY MOUTH 2 TIMES DAILY AS NEEDED.  Dispense: 180 capsule; Refill: 0    Elizabeth Sauer, MD

## 2023-01-04 DIAGNOSIS — R9389 Abnormal findings on diagnostic imaging of other specified body structures: Secondary | ICD-10-CM | POA: Diagnosis not present

## 2023-01-05 DIAGNOSIS — R110 Nausea: Secondary | ICD-10-CM | POA: Diagnosis not present

## 2023-01-05 DIAGNOSIS — C7800 Secondary malignant neoplasm of unspecified lung: Secondary | ICD-10-CM | POA: Diagnosis not present

## 2023-01-05 DIAGNOSIS — E876 Hypokalemia: Secondary | ICD-10-CM | POA: Diagnosis not present

## 2023-01-05 DIAGNOSIS — Z5112 Encounter for antineoplastic immunotherapy: Secondary | ICD-10-CM | POA: Diagnosis not present

## 2023-01-05 DIAGNOSIS — R911 Solitary pulmonary nodule: Secondary | ICD-10-CM | POA: Diagnosis not present

## 2023-01-05 DIAGNOSIS — Z5111 Encounter for antineoplastic chemotherapy: Secondary | ICD-10-CM | POA: Diagnosis not present

## 2023-01-05 DIAGNOSIS — I2693 Single subsegmental pulmonary embolism without acute cor pulmonale: Secondary | ICD-10-CM | POA: Diagnosis not present

## 2023-01-05 DIAGNOSIS — C50911 Malignant neoplasm of unspecified site of right female breast: Secondary | ICD-10-CM | POA: Diagnosis not present

## 2023-01-05 DIAGNOSIS — C7951 Secondary malignant neoplasm of bone: Secondary | ICD-10-CM | POA: Diagnosis not present

## 2023-01-05 DIAGNOSIS — C787 Secondary malignant neoplasm of liver and intrahepatic bile duct: Secondary | ICD-10-CM | POA: Diagnosis not present

## 2023-01-05 DIAGNOSIS — C786 Secondary malignant neoplasm of retroperitoneum and peritoneum: Secondary | ICD-10-CM | POA: Diagnosis not present

## 2023-01-05 DIAGNOSIS — K521 Toxic gastroenteritis and colitis: Secondary | ICD-10-CM | POA: Diagnosis not present

## 2023-01-05 DIAGNOSIS — R918 Other nonspecific abnormal finding of lung field: Secondary | ICD-10-CM | POA: Diagnosis not present

## 2023-01-06 DIAGNOSIS — H25813 Combined forms of age-related cataract, bilateral: Secondary | ICD-10-CM | POA: Diagnosis not present

## 2023-01-14 DIAGNOSIS — E119 Type 2 diabetes mellitus without complications: Secondary | ICD-10-CM | POA: Diagnosis not present

## 2023-01-15 LAB — HEMOGLOBIN A1C
Est. average glucose Bld gHb Est-mCnc: 166 mg/dL
Hgb A1c MFr Bld: 7.4 % — ABNORMAL HIGH (ref 4.8–5.6)

## 2023-01-17 ENCOUNTER — Other Ambulatory Visit: Payer: Self-pay

## 2023-01-17 DIAGNOSIS — E119 Type 2 diabetes mellitus without complications: Secondary | ICD-10-CM

## 2023-01-17 MED ORDER — VICTOZA 18 MG/3ML ~~LOC~~ SOPN
PEN_INJECTOR | SUBCUTANEOUS | 1 refills | Status: DC
Start: 2023-01-17 — End: 2023-05-13

## 2023-01-26 DIAGNOSIS — Z5111 Encounter for antineoplastic chemotherapy: Secondary | ICD-10-CM | POA: Diagnosis not present

## 2023-01-26 DIAGNOSIS — R911 Solitary pulmonary nodule: Secondary | ICD-10-CM | POA: Diagnosis not present

## 2023-01-26 DIAGNOSIS — C50912 Malignant neoplasm of unspecified site of left female breast: Secondary | ICD-10-CM | POA: Diagnosis not present

## 2023-01-26 DIAGNOSIS — Z5112 Encounter for antineoplastic immunotherapy: Secondary | ICD-10-CM | POA: Diagnosis not present

## 2023-01-26 DIAGNOSIS — C787 Secondary malignant neoplasm of liver and intrahepatic bile duct: Secondary | ICD-10-CM | POA: Diagnosis not present

## 2023-01-26 DIAGNOSIS — C7989 Secondary malignant neoplasm of other specified sites: Secondary | ICD-10-CM | POA: Diagnosis not present

## 2023-01-26 DIAGNOSIS — Z86711 Personal history of pulmonary embolism: Secondary | ICD-10-CM | POA: Diagnosis not present

## 2023-01-26 DIAGNOSIS — E876 Hypokalemia: Secondary | ICD-10-CM | POA: Diagnosis not present

## 2023-01-26 DIAGNOSIS — C7951 Secondary malignant neoplasm of bone: Secondary | ICD-10-CM | POA: Diagnosis not present

## 2023-01-26 DIAGNOSIS — K521 Toxic gastroenteritis and colitis: Secondary | ICD-10-CM | POA: Diagnosis not present

## 2023-01-26 DIAGNOSIS — Z79899 Other long term (current) drug therapy: Secondary | ICD-10-CM | POA: Diagnosis not present

## 2023-01-26 DIAGNOSIS — Z1379 Encounter for other screening for genetic and chromosomal anomalies: Secondary | ICD-10-CM | POA: Diagnosis not present

## 2023-01-26 DIAGNOSIS — R9389 Abnormal findings on diagnostic imaging of other specified body structures: Secondary | ICD-10-CM | POA: Diagnosis not present

## 2023-01-26 DIAGNOSIS — C50911 Malignant neoplasm of unspecified site of right female breast: Secondary | ICD-10-CM | POA: Diagnosis not present

## 2023-02-01 DIAGNOSIS — H25813 Combined forms of age-related cataract, bilateral: Secondary | ICD-10-CM | POA: Diagnosis not present

## 2023-02-01 DIAGNOSIS — H04123 Dry eye syndrome of bilateral lacrimal glands: Secondary | ICD-10-CM | POA: Diagnosis not present

## 2023-02-16 DIAGNOSIS — Z170 Estrogen receptor positive status [ER+]: Secondary | ICD-10-CM | POA: Diagnosis not present

## 2023-02-16 DIAGNOSIS — R110 Nausea: Secondary | ICD-10-CM | POA: Diagnosis not present

## 2023-02-16 DIAGNOSIS — Z5112 Encounter for antineoplastic immunotherapy: Secondary | ICD-10-CM | POA: Diagnosis not present

## 2023-02-16 DIAGNOSIS — K521 Toxic gastroenteritis and colitis: Secondary | ICD-10-CM | POA: Diagnosis not present

## 2023-02-16 DIAGNOSIS — C787 Secondary malignant neoplasm of liver and intrahepatic bile duct: Secondary | ICD-10-CM | POA: Diagnosis not present

## 2023-02-16 DIAGNOSIS — C50911 Malignant neoplasm of unspecified site of right female breast: Secondary | ICD-10-CM | POA: Diagnosis not present

## 2023-02-16 DIAGNOSIS — C7951 Secondary malignant neoplasm of bone: Secondary | ICD-10-CM | POA: Diagnosis not present

## 2023-02-16 DIAGNOSIS — C7800 Secondary malignant neoplasm of unspecified lung: Secondary | ICD-10-CM | POA: Diagnosis not present

## 2023-02-16 DIAGNOSIS — E876 Hypokalemia: Secondary | ICD-10-CM | POA: Diagnosis not present

## 2023-02-16 DIAGNOSIS — R9389 Abnormal findings on diagnostic imaging of other specified body structures: Secondary | ICD-10-CM | POA: Diagnosis not present

## 2023-02-16 DIAGNOSIS — C786 Secondary malignant neoplasm of retroperitoneum and peritoneum: Secondary | ICD-10-CM | POA: Diagnosis not present

## 2023-02-16 DIAGNOSIS — Z5111 Encounter for antineoplastic chemotherapy: Secondary | ICD-10-CM | POA: Diagnosis not present

## 2023-02-16 DIAGNOSIS — Z1379 Encounter for other screening for genetic and chromosomal anomalies: Secondary | ICD-10-CM | POA: Diagnosis not present

## 2023-03-03 ENCOUNTER — Ambulatory Visit (INDEPENDENT_AMBULATORY_CARE_PROVIDER_SITE_OTHER): Payer: Medicare PPO

## 2023-03-03 VITALS — Ht 62.0 in | Wt 154.0 lb

## 2023-03-03 DIAGNOSIS — H6123 Impacted cerumen, bilateral: Secondary | ICD-10-CM | POA: Diagnosis not present

## 2023-03-03 DIAGNOSIS — Z0000 Encounter for general adult medical examination without abnormal findings: Secondary | ICD-10-CM

## 2023-03-03 DIAGNOSIS — H9193 Unspecified hearing loss, bilateral: Secondary | ICD-10-CM | POA: Diagnosis not present

## 2023-03-03 NOTE — Progress Notes (Signed)
I connected with  Gardiner Barefoot on 03/03/23 by a audio enabled telemedicine application and verified that I am speaking with the correct person using two identifiers.  Patient Location: Home  Provider Location: Office/Clinic  I discussed the limitations of evaluation and management by telemedicine. The patient expressed understanding and agreed to proceed.  Subjective:   Linda Vazquez is a 77 y.o. female who presents for Medicare Annual (Subsequent) preventive examination.  Review of Systems     Cardiac Risk Factors include: advanced age (>16men, >11 women);diabetes mellitus;hypertension;sedentary lifestyle     Objective:    Today's Vitals   03/03/23 1012  Weight: 154 lb (69.9 kg)  Height: 5\' 2"  (1.575 m)   Body mass index is 28.17 kg/m.     03/03/2023   10:03 AM 03/01/2022    8:37 AM 02/23/2021    8:28 AM 02/13/2020    8:39 AM 02/07/2019    8:40 AM 12/18/2018    9:43 AM 02/06/2018    8:27 AM  Advanced Directives  Does Patient Have a Medical Advance Directive? No Yes Yes Yes Yes Yes Yes  Type of Furniture conservator/restorer;Living will Healthcare Power of [place];Living will Healthcare Power of [place];Living will Living will;Healthcare Power of Attorney Living will;Healthcare Power of [employer] Power of [place];Living will  Does patient want to make changes to medical advance directive?      No - Patient declined   Copy of Healthcare Power of Attorney in Chart?  No - copy requested No - copy requested Yes - validated most recent copy scanned in chart (See row information) No - copy requested  No - copy requested  Would patient like information on creating a medical advance directive? No - Patient declined          Current Medications (verified) Outpatient Encounter Medications as of 03/03/2023  Medication Sig   ACCU-CHEK GUIDE test strip USE TO TEST EVERY DAY   Accu-Chek Softclix Lancets lancets 100 each by Other route daily. Use as  instructed/ Dx E11.9   atorvastatin (LIPITOR) 10 MG tablet Take 1 tablet (10 mg total) by mouth daily.   benzonatate (TESSALON) 100 MG capsule TAKE 1 CAPSULE BY MOUTH 2 TIMES DAILY AS NEEDED.   Blood Glucose Monitoring Suppl (ACCU-CHEK GUIDE ME) w/Device KIT    CALCIUM PO Take 650 mg by mouth in the morning and at bedtime.   carbamide peroxide (DEBROX) 6.5 % OTIC solution Place 5 drops into both ears 2 (two) times daily. (Patient taking differently: Place 5 drops into both ears 2 (two) times daily as needed.)   diphenoxylate-atropine (LOMOTIL) 2.5-0.025 MG tablet Take by mouth.   hydrochlorothiazide (HYDRODIURIL) 25 MG tablet Take 1 tablet (25 mg total) by mouth daily.   Insulin Pen Needle (BD PEN NEEDLE NANO 2ND GEN) 32G X 4 MM MISC Use as directed   levothyroxine (SYNTHROID) 50 MCG tablet Take 1 tablet (50 mcg total) by mouth daily.   lidocaine-prilocaine (EMLA) cream Apply topically.   liraglutide (VICTOZA) 18 MG/3ML SOPN INJECT 1.8 MG UNDER THE SKIN ONCE DAILY   mometasone (ELOCON) 0.1 % cream Apply 1 Application topically as directed. 3-5 days per week as needed to affected areas of legs   montelukast (SINGULAIR) 10 MG tablet TAKE 1 TABLET BY MOUTH EVERYDAY AT BEDTIME   naloxone (NARCAN) nasal spray 4 mg/0.1 mL Place into the nose.   OLANZapine (ZYPREXA) 5 MG tablet Take by mouth.   omeprazole (PRILOSEC) 40 MG capsule Take 1  capsule (40 mg total) by mouth daily.   prochlorperazine (COMPAZINE) 10 MG tablet Take by mouth.   SYMBICORT 160-4.5 MCG/ACT inhaler pulmonology   albuterol (VENTOLIN HFA) 108 (90 Base) MCG/ACT inhaler Inhale 1-2 puffs into the lungs every 6 (six) hours as needed for wheezing or shortness of breath.   amitriptyline (ELAVIL) 10 MG tablet Take 1 tablet by mouth at bedtime as needed. Dr Nelta Numbers DVT/PE STARTER PACK 5 mg 2 (two) times daily.   gabapentin (NEURONTIN) 300 MG/6ML solution Take 300 mg by mouth. (Patient not taking: Reported on 03/03/2023)    MAGNESIUM PO Take by mouth. (Patient not taking: Reported on 03/03/2023)   Multiple Vitamin (MULTIVITAMIN WITH MINERALS) TABS tablet Take 1 tablet by mouth daily. (Patient not taking: Reported on 03/03/2023)   ondansetron (ZOFRAN) 8 MG tablet Take by mouth.   tretinoin (RETIN-A) 0.025 % cream Apply a pea sized amount to the entire face QHS. (Patient not taking: Reported on 03/03/2023)   No facility-administered encounter medications on file as of 03/03/2023.    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   left dorsum hand   Squamous cell carcinoma of skin 09/24/2020   right dorsum hand proximal to index finger MCP - [facility], ED&C    Thyroid disease    Varix of lower extremity    Past Surgical History:  Procedure Laterality Date   BIOPSY THYROID     benign   BREAST LUMPECTOMY Right    COLONOSCOPY  2006   repeat in 10 yrs- [facility] Dr   KNEE ARTHROSCOPY WITH MENISCAL REPAIR Bilateral    MASTECTOMY Left    MASTECTOMY Right    2 yrs later   MEDIASTINAL MASS EXCISION     SKIN CANCER EXCISION     melanoma- stripped nodes on L) side   TONSILLECTOMY     tubal cauterization     Family History  Problem Relation Age of Onset   Hypertension Mother    Heart disease Mother    Heart disease Father    Diabetes Brother    Diabetes Paternal Uncle    Diabetes Brother    Social History   Socioeconomic History   Marital status: Married    Spouse name: Not on file   Number of children: 0   Years of education: Not on file   Highest education level: Master's degree (e.g., MA, MS, MEng, MEd, MSW, MBA)  Occupational History   Occupation:  Retired  Tobacco Use   Smoking status: Never   Smokeless tobacco: Never   Tobacco comments:    Smoking cessation materials not required  Vaping Use   Vaping Use: Never used  Substance and Sexual Activity   Alcohol use: Yes    Alcohol/week: 0.0 standard drinks of alcohol    Comment: socially 1-2 x month   Drug use: No   Sexual activity: Not Currently  Other Topics Concern   Not on file  Social History Narrative   Not on file   Social Determinants of Health   Financial Resource Strain: Low Risk  (03/03/2023)  Overall Financial Resource Strain (CARDIA)    Difficulty of Paying Living Expenses: Not hard at all  Food Insecurity: No Food Insecurity (03/03/2023)   Hunger Vital Sign    Worried About Running Out of Food in the Last Year: Never true    Ran Out of Food in the Last Year: Never true  Transportation Needs: No Transportation Needs (03/03/2023)   PRAPARE - Administrator, Civil Service (Medical): No    Lack of Transportation (Non-Medical): No  Physical Activity: Insufficiently Active (03/03/2023)   Exercise Vital Sign    Days of Exercise per Week: 3 days    Minutes of Exercise per Session: 30 min  Stress: No Stress Concern Present (03/03/2023)   [employer] of Occupational Health - Occupational Stress Questionnaire    Feeling of Stress : Only a little  Social Connections: Moderately Isolated (03/03/2023)   Social Connection and Isolation Panel [NHANES]    Frequency of Communication with Friends and Family: Twice a week    Frequency of Social Gatherings with Friends and Family: Once a week    Attends Religious Services: Never    Database administrator or Organizations: No    Attends Engineer, structural: Never    Marital Status: Married    Tobacco Counseling Counseling given: Not Answered Tobacco comments: Smoking cessation materials not required   Clinical Intake:  Pre-visit preparation completed: Yes  Pain : No/denies pain      Nutritional Risks: None Diabetes: Yes CBG done?: No Did pt. bring in CBG monitor from home?: No  How often do you need to have someone help you when you read instructions, pamphlets, or other written materials from your doctor or pharmacy?: 1 - Never  Diabetic?yes Nutrition Risk Assessment:  Has the patient had any N/V/D within the last 2 months?  Yes  Does the patient have any non-healing wounds?  No  Has the patient had any unintentional weight loss or weight gain?  No   Diabetes:  Is the patient diabetic?  Yes  If diabetic, was a CBG obtained today?  No  Did the patient bring in their glucometer from home?  No  How often do you monitor your CBG's? occasionally.   Financial Strains and Diabetes Management:  Are you having any financial strains with the device, your supplies or your medication? No .  Does the patient want to be seen by Chronic Care Management for management of their diabetes?  No  Would the patient like to be referred to a Nutritionist or for Diabetic Management?  No   Diabetic Exams:  Diabetic Eye Exam: Completed 04/30/20. HAD APPT FEW MONTHS AGO. Pt has been advised about the importance in completing this exam.   Diabetic Foot Exam: Completed 12/23/21. Pt has been advised about the importance in completing this exam.    Interpreter Needed?: No  Information entered by :: Kennedy Bucker, LPN   Activities of Daily Living    03/03/2023   10:03 AM  In your present state of health, do you have any difficulty performing the following activities:  Hearing? 0  Vision? 0  Difficulty concentrating or making decisions? 0  Walking or climbing stairs? 0  Dressing or bathing? 0  Doing errands, shopping? 0  Preparing Food and eating ? N  Using the Toilet? N  In the past six months, have you accidently leaked urine? N  Do you have problems with loss of bowel control? N  Managing your Medications? N  Managing  your Finances? N  Housekeeping or managing your  Housekeeping? N    Patient Care Team: Duanne Limerick, MD as PCP - General (Family Medicine) Gearlean Alf, MD as Consulting Physician (Pulmonary Disease) Dayna Ramus, MD as Consulting Physician (Otolaryngology) [place], [facility] Morrison Old, Dineen Kid, MD (Dermatology) Dent, Victory Dakin, MD as Referring Physician (Oncology)  Indicate any recent Medical Services you may have received from other than [facility] providers in the past year (date may be approximate).     Assessment:   This is a routine wellness examination for Linda Vazquez.  Hearing/Vision screen Hearing Screening - Comments:: No aids Vision Screening - Comments:: Wears glasses- Dr.Jackson at [employer]  Dietary issues and exercise activities discussed: Current Exercise Habits: Home exercise routine, Type of exercise: walking, Time (Minutes): 30, Frequency (Times/Week): 3, Weekly Exercise (Minutes/Week): 90, Intensity: Mild   Goals Addressed             This Visit's Progress    DIET - EAT MORE FRUITS AND VEGETABLES         Depression Screen    03/03/2023   10:01 AM 01/03/2023   10:31 AM 09/06/2022   10:47 AM 05/03/2022   11:21 AM 03/01/2022    8:36 AM 12/23/2021   10:21 AM 02/23/2021    8:27 AM  PHQ 2/9 Scores  PHQ - 2 Score 0 0 0 0 0 0 0  PHQ- 9 Score 0 0 1 0  2     Fall Risk    03/03/2023   10:03 AM 01/03/2023   10:37 AM 01/03/2023   10:31 AM 09/06/2022   10:47 AM 05/03/2022   11:21 AM  Fall Risk   Falls in the past year? 0 0 0 0 1  Number falls in past yr: 0 0 0 0 0  Injury with Fall? 0 0 0 0 0  Risk for fall due to : No Fall Risks No Fall Risks No Fall Risks No Fall Risks Impaired balance/gait  Follow up Falls prevention discussed;Falls evaluation completed Falls evaluation completed Falls evaluation completed Falls evaluation completed Falls evaluation completed    FALL RISK PREVENTION PERTAINING TO THE HOME:  Any stairs in or around the home? Yes  If so, are there any without  handrails? No  Home free of loose throw rugs in walkways, pet beds, electrical cords, etc? Yes  Adequate lighting in your home to reduce risk of falls? Yes   ASSISTIVE DEVICES UTILIZED TO PREVENT FALLS:  Life alert? No  Use of a cane, walker or w/c? No  Grab bars in the bathroom? Yes  Shower chair or bench in shower? Yes  Elevated toilet seat or a handicapped toilet? Yes    Cognitive Function:        03/03/2023   10:08 AM 02/07/2019    8:45 AM 02/06/2018    8:30 AM  6CIT Screen  What Year? 0 points 0 points 0 points  What month? 0 points 0 points 0 points  What time? 0 points 0 points 0 points  Count back from 20 0 points 0 points 0 points  Months in reverse 0 points 0 points 0 points  Repeat phrase 0 points 0 points 0 points  Total Score 0 points 0 points 0 points    Immunizations Immunization History  Administered Date(s) Administered   Influenza, High Dose Seasonal PF 07/06/2017, 06/28/2018, 05/29/2019   Influenza-Unspecified 05/21/2014, 06/24/2014, 06/03/2015, 07/06/2017, 05/22/2019, 06/02/2020, 07/09/2022   Moderna Sars-Covid-2 Vaccination 10/05/2019, 11/02/2019, 08/05/2020,  01/22/2021   Pneumococcal Conjugate-13 11/10/2016   Pneumococcal Polysaccharide-23 01/16/2014   Tdap 11/06/2015   Zoster, Live 03/09/2012    TDAP status: Up to date  Flu Vaccine status: Up to date  Pneumococcal vaccine status: Up to date  Covid-19 vaccine status: Completed vaccines  Qualifies for Shingles Vaccine? Yes   Zostavax completed Yes   Shingrix Completed?: No.    Education has been provided regarding the importance of this vaccine. Patient has been advised to call insurance company to determine out of pocket expense if they have not yet received this vaccine. Advised may also receive vaccine at local pharmacy or Health Dept. Verbalized acceptance and understanding.  Screening Tests Health Maintenance  Topic Date Due   Zoster Vaccines- Shingrix (1 of 2) Never done    OPHTHALMOLOGY EXAM  04/30/2021   Diabetic kidney evaluation - eGFR measurement  04/26/2022   COVID-19 Vaccine (5 - 2023-24 season) 05/21/2022   FOOT EXAM  12/24/2022   INFLUENZA VACCINE  04/21/2023   HEMOGLOBIN A1C  07/16/2023   Diabetic kidney evaluation - Urine ACR  09/07/2023   Medicare Annual Wellness (AWV)  03/02/2024   DTaP/Tdap/Td (2 - Td or Tdap) 11/05/2025   Pneumonia Vaccine 73+ Years old  Completed   DEXA SCAN  Completed   Hepatitis C Screening  Completed   HPV VACCINES  Aged Out   MAMMOGRAM  Discontinued   Colonoscopy  Discontinued    Health Maintenance  Health Maintenance Due  Topic Date Due   Zoster Vaccines- Shingrix (1 of 2) Never done   OPHTHALMOLOGY EXAM  04/30/2021   Diabetic kidney evaluation - eGFR measurement  04/26/2022   COVID-19 Vaccine (5 - 2023-24 season) 05/21/2022   FOOT EXAM  12/24/2022    Colorectal cancer screening: No longer required.   Mammogram status: No longer required due to AGE.  Bone Density status: Completed 11/11/15. Results reflect: Bone density results: NORMAL. Repeat every 5 years.- DECLINED REFERRAL  Lung Cancer Screening: (Low Dose CT Chest recommended if Age 68-80 years, 30 pack-year currently smoking OR have quit w/in 15years.) does not qualify.    Additional Screening:  Hepatitis C Screening: does qualify; Completed 10/17/17  Vision Screening: Recommended annual ophthalmology exams for early detection of glaucoma and other disorders of the eye. Is the patient up to date with their annual eye exam?  Yes  Who is the provider or what is the name of the office in which the patient attends annual eye exams? [facility] If pt is not established with a provider, would they like to be referred to a provider to establish care? No .   Dental Screening: Recommended annual dental exams for proper oral hygiene  Community Resource Referral / Chronic Care Management: CRR required this visit?  No   CCM required this visit?  No       Plan:     I have personally reviewed and noted the following in the patient's chart:   Medical and social history Use of alcohol, tobacco or illicit drugs  Current medications and supplements including opioid prescriptions. Patient is not currently taking opioid prescriptions. Functional ability and status Nutritional status Physical activity Advanced directives List of other physicians Hospitalizations, surgeries, and ER visits in previous 12 months Vitals Screenings to include cognitive, depression, and falls Referrals and appointments  In addition, I have reviewed and discussed with patient certain preventive protocols, quality metrics, and best practice recommendations. A written personalized care plan for preventive services as well as general preventive health  recommendations were provided to patient.     Hal Hope, LPN   [id number]/[id number]   Nurse Notes: none

## 2023-03-03 NOTE — Patient Instructions (Signed)
Linda Vazquez , Thank you for taking time to come for your Medicare Wellness Visit. I appreciate your ongoing commitment to your health goals. Please review the following plan we discussed and let me know if I can assist you in the future.   These are the goals we discussed:  Goals      DIET - EAT MORE FRUITS AND VEGETABLES     DIET - INCREASE WATER INTAKE     Recommend to drink at least 6-8 8oz glasses of water per day.     Patient Stated     Pt would like to maintain A1c with recent diagnosis of Type 2 DM        This is a list of the screening recommended for you and due dates:  Health Maintenance  Topic Date Due   Zoster (Shingles) Vaccine (1 of 2) Never done   Eye exam for diabetics  04/30/2021   Yearly kidney function blood test for diabetes  04/26/2022   COVID-19 Vaccine (5 - 2023-24 season) 05/21/2022   Complete foot exam   12/24/2022   Flu Shot  04/21/2023   Hemoglobin A1C  07/16/2023   Yearly kidney health urinalysis for diabetes  09/07/2023   Medicare Annual Wellness Visit  03/02/2024   DTaP/Tdap/Td vaccine (2 - Td or Tdap) 11/05/2025   Pneumonia Vaccine  Completed   DEXA scan (bone density measurement)  Completed   Hepatitis C Screening  Completed   HPV Vaccine  Aged Out   Mammogram  Discontinued   Colon Cancer Screening  Discontinued    Advanced directives: no  Conditions/risks identified: none  Next appointment: Follow up in one year for your annual wellness visit 03/08/24 @ 8:15 am by phone   Preventive Care 65 Years and Older, Female Preventive care refers to lifestyle choices and visits with your health care provider that can promote health and wellness. What does preventive care include? A yearly physical exam. This is also called an annual well check. Dental exams once or twice a year. Routine eye exams. Ask your health care provider how often you should have your eyes checked. Personal lifestyle choices, including: Daily care of your teeth and  gums. Regular physical activity. Eating a healthy diet. Avoiding tobacco and drug use. Limiting alcohol use. Practicing safe sex. Taking low-dose aspirin every day. Taking vitamin and mineral supplements as recommended by your health care provider. What happens during an annual well check? The services and screenings done by your health care provider during your annual well check will depend on your age, overall health, lifestyle risk factors, and family history of disease. Counseling  Your health care provider may ask you questions about your: Alcohol use. Tobacco use. Drug use. Emotional well-being. Home and relationship well-being. Sexual activity. Eating habits. History of falls. Memory and ability to understand (cognition). Work and work Astronomer. Reproductive health. Screening  You may have the following tests or measurements: Height, weight, and BMI. Blood pressure. Lipid and cholesterol levels. These may be checked every 5 years, or more frequently if you are over 23 years old. Skin check. Lung cancer screening. You may have this screening every year starting at age 77 if you have a 30-pack-year history of smoking and currently smoke or have quit within the past 15 years. Fecal occult blood test (FOBT) of the stool. You may have this test every year starting at age 74. Flexible sigmoidoscopy or colonoscopy. You may have a sigmoidoscopy every 5 years or a colonoscopy every 10 years  starting at age 50. Hepatitis C blood test. Hepatitis B blood test. Sexually transmitted disease (STD) testing. Diabetes screening. This is done by checking your blood sugar (glucose) after you have not eaten for a while (fasting). You may have this done every 1-3 years. Bone density scan. This is done to screen for osteoporosis. You may have this done starting at age 86. Mammogram. This may be done every 1-2 years. Talk to your health care provider about how often you should have regular  mammograms. Talk with your health care provider about your test results, treatment options, and if necessary, the need for more tests. Vaccines  Your health care provider may recommend certain vaccines, such as: Influenza vaccine. This is recommended every year. Tetanus, diphtheria, and acellular pertussis (Tdap, Td) vaccine. You may need a Td booster every 10 years. Zoster vaccine. You may need this after age 15. Pneumococcal 13-valent conjugate (PCV13) vaccine. One dose is recommended after age 63. Pneumococcal polysaccharide (PPSV23) vaccine. One dose is recommended after age 77. Talk to your health care provider about which screenings and vaccines you need and how often you need them. This information is not intended to replace advice given to you by your health care provider. Make sure you discuss any questions you have with your health care provider. Document Released: 10/03/2015 Document Revised: 05/26/2016 Document Reviewed: 07/08/2015 Elsevier Interactive Patient Education  2017 [place] Prevention in the Home Falls can cause injuries. They can happen to people of all ages. There are many things you can do to make your home safe and to help prevent falls. What can I do on the outside of my home? Regularly fix the edges of walkways and driveways and fix any cracks. Remove anything that might make you trip as you walk through a door, such as a raised step or threshold. Trim any bushes or trees on the path to your home. Use bright outdoor lighting. Clear any walking paths of anything that might make someone trip, such as rocks or tools. Regularly check to see if handrails are loose or broken. Make sure that both sides of any steps have handrails. Any raised decks and porches should have guardrails on the edges. Have any leaves, snow, or ice cleared regularly. Use sand or salt on walking paths during winter. Clean up any spills in your garage right away. This includes oil  or grease spills. What can I do in the bathroom? Use night lights. Install grab bars by the toilet and in the tub and shower. Do not use towel bars as grab bars. Use non-skid mats or decals in the tub or shower. If you need to sit down in the shower, use a plastic, non-slip stool. Keep the floor dry. Clean up any water that spills on the floor as soon as it happens. Remove soap buildup in the tub or shower regularly. Attach bath mats securely with double-sided non-slip rug tape. Do not have throw rugs and other things on the floor that can make you trip. What can I do in the bedroom? Use night lights. Make sure that you have a light by your bed that is easy to reach. Do not use any sheets or blankets that are too big for your bed. They should not hang down onto the floor. Have a firm chair that has side arms. You can use this for support while you get dressed. Do not have throw rugs and other things on the floor that can make you trip. What  can I do in the kitchen? Clean up any spills right away. Avoid walking on wet floors. Keep items that you use a lot in easy-to-reach places. If you need to reach something above you, use a strong step stool that has a grab bar. Keep electrical cords out of the way. Do not use floor polish or wax that makes floors slippery. If you must use wax, use non-skid floor wax. Do not have throw rugs and other things on the floor that can make you trip. What can I do with my stairs? Do not leave any items on the stairs. Make sure that there are handrails on both sides of the stairs and use them. Fix handrails that are broken or loose. Make sure that handrails are as long as the stairways. Check any carpeting to make sure that it is firmly attached to the stairs. Fix any carpet that is loose or worn. Avoid having throw rugs at the top or bottom of the stairs. If you do have throw rugs, attach them to the floor with carpet tape. Make sure that you have a light  switch at the top of the stairs and the bottom of the stairs. If you do not have them, ask someone to add them for you. What else can I do to help prevent falls? Wear shoes that: Do not have high heels. Have rubber bottoms. Are comfortable and fit you well. Are closed at the toe. Do not wear sandals. If you use a stepladder: Make sure that it is fully opened. Do not climb a closed stepladder. Make sure that both sides of the stepladder are locked into place. Ask someone to hold it for you, if possible. Clearly mark and make sure that you can see: Any grab bars or handrails. First and last steps. Where the edge of each step is. Use tools that help you move around (mobility aids) if they are needed. These include: Canes. Walkers. Scooters. Crutches. Turn on the lights when you go into a dark area. Replace any light bulbs as soon as they burn out. Set up your furniture so you have a clear path. Avoid moving your furniture around. If any of your floors are uneven, fix them. If there are any pets around you, be aware of where they are. Review your medicines with your doctor. Some medicines can make you feel dizzy. This can increase your chance of falling. Ask your doctor what other things that you can do to help prevent falls. This information is not intended to replace advice given to you by your health care provider. Make sure you discuss any questions you have with your health care provider. Document Released: 07/03/2009 Document Revised: 02/12/2016 Document Reviewed: 10/11/2014 Elsevier Interactive Patient Education  2017 [employer].

## 2023-03-07 DIAGNOSIS — C7800 Secondary malignant neoplasm of unspecified lung: Secondary | ICD-10-CM | POA: Diagnosis not present

## 2023-03-07 DIAGNOSIS — C50911 Malignant neoplasm of unspecified site of right female breast: Secondary | ICD-10-CM | POA: Diagnosis not present

## 2023-03-07 DIAGNOSIS — K521 Toxic gastroenteritis and colitis: Secondary | ICD-10-CM | POA: Diagnosis not present

## 2023-03-07 DIAGNOSIS — C786 Secondary malignant neoplasm of retroperitoneum and peritoneum: Secondary | ICD-10-CM | POA: Diagnosis not present

## 2023-03-07 DIAGNOSIS — Z5112 Encounter for antineoplastic immunotherapy: Secondary | ICD-10-CM | POA: Diagnosis not present

## 2023-03-07 DIAGNOSIS — R110 Nausea: Secondary | ICD-10-CM | POA: Diagnosis not present

## 2023-03-07 DIAGNOSIS — Z1379 Encounter for other screening for genetic and chromosomal anomalies: Secondary | ICD-10-CM | POA: Diagnosis not present

## 2023-03-07 DIAGNOSIS — E876 Hypokalemia: Secondary | ICD-10-CM | POA: Diagnosis not present

## 2023-03-07 DIAGNOSIS — C7951 Secondary malignant neoplasm of bone: Secondary | ICD-10-CM | POA: Diagnosis not present

## 2023-03-07 DIAGNOSIS — C787 Secondary malignant neoplasm of liver and intrahepatic bile duct: Secondary | ICD-10-CM | POA: Diagnosis not present

## 2023-03-07 DIAGNOSIS — Z5111 Encounter for antineoplastic chemotherapy: Secondary | ICD-10-CM | POA: Diagnosis not present

## 2023-03-29 DIAGNOSIS — R600 Localized edema: Secondary | ICD-10-CM | POA: Diagnosis not present

## 2023-03-29 DIAGNOSIS — Z5112 Encounter for antineoplastic immunotherapy: Secondary | ICD-10-CM | POA: Diagnosis not present

## 2023-03-29 DIAGNOSIS — T451X5A Adverse effect of antineoplastic and immunosuppressive drugs, initial encounter: Secondary | ICD-10-CM | POA: Diagnosis not present

## 2023-03-29 DIAGNOSIS — C7801 Secondary malignant neoplasm of right lung: Secondary | ICD-10-CM | POA: Diagnosis not present

## 2023-03-29 DIAGNOSIS — C50911 Malignant neoplasm of unspecified site of right female breast: Secondary | ICD-10-CM | POA: Diagnosis not present

## 2023-03-29 DIAGNOSIS — C787 Secondary malignant neoplasm of liver and intrahepatic bile duct: Secondary | ICD-10-CM | POA: Diagnosis not present

## 2023-03-29 DIAGNOSIS — C7800 Secondary malignant neoplasm of unspecified lung: Secondary | ICD-10-CM | POA: Diagnosis not present

## 2023-03-29 DIAGNOSIS — Z5111 Encounter for antineoplastic chemotherapy: Secondary | ICD-10-CM | POA: Diagnosis not present

## 2023-03-29 DIAGNOSIS — C786 Secondary malignant neoplasm of retroperitoneum and peritoneum: Secondary | ICD-10-CM | POA: Diagnosis not present

## 2023-03-29 DIAGNOSIS — E876 Hypokalemia: Secondary | ICD-10-CM | POA: Diagnosis not present

## 2023-03-29 DIAGNOSIS — C7802 Secondary malignant neoplasm of left lung: Secondary | ICD-10-CM | POA: Diagnosis not present

## 2023-03-29 DIAGNOSIS — R197 Diarrhea, unspecified: Secondary | ICD-10-CM | POA: Diagnosis not present

## 2023-03-29 DIAGNOSIS — G620 Drug-induced polyneuropathy: Secondary | ICD-10-CM | POA: Diagnosis not present

## 2023-03-29 DIAGNOSIS — R110 Nausea: Secondary | ICD-10-CM | POA: Diagnosis not present

## 2023-03-29 DIAGNOSIS — C50919 Malignant neoplasm of unspecified site of unspecified female breast: Secondary | ICD-10-CM | POA: Diagnosis not present

## 2023-03-29 DIAGNOSIS — C7951 Secondary malignant neoplasm of bone: Secondary | ICD-10-CM | POA: Diagnosis not present

## 2023-03-31 DIAGNOSIS — C50911 Malignant neoplasm of unspecified site of right female breast: Secondary | ICD-10-CM | POA: Diagnosis not present

## 2023-03-31 DIAGNOSIS — C7801 Secondary malignant neoplasm of right lung: Secondary | ICD-10-CM | POA: Diagnosis not present

## 2023-03-31 DIAGNOSIS — Z5111 Encounter for antineoplastic chemotherapy: Secondary | ICD-10-CM | POA: Diagnosis not present

## 2023-03-31 DIAGNOSIS — C7800 Secondary malignant neoplasm of unspecified lung: Secondary | ICD-10-CM | POA: Diagnosis not present

## 2023-03-31 DIAGNOSIS — K521 Toxic gastroenteritis and colitis: Secondary | ICD-10-CM | POA: Diagnosis not present

## 2023-03-31 DIAGNOSIS — C7951 Secondary malignant neoplasm of bone: Secondary | ICD-10-CM | POA: Diagnosis not present

## 2023-03-31 DIAGNOSIS — Z5112 Encounter for antineoplastic immunotherapy: Secondary | ICD-10-CM | POA: Diagnosis not present

## 2023-03-31 DIAGNOSIS — C786 Secondary malignant neoplasm of retroperitoneum and peritoneum: Secondary | ICD-10-CM | POA: Diagnosis not present

## 2023-03-31 DIAGNOSIS — Z853 Personal history of malignant neoplasm of breast: Secondary | ICD-10-CM | POA: Diagnosis not present

## 2023-03-31 DIAGNOSIS — C787 Secondary malignant neoplasm of liver and intrahepatic bile duct: Secondary | ICD-10-CM | POA: Diagnosis not present

## 2023-03-31 DIAGNOSIS — T451X5A Adverse effect of antineoplastic and immunosuppressive drugs, initial encounter: Secondary | ICD-10-CM | POA: Diagnosis not present

## 2023-04-05 DIAGNOSIS — D252 Subserosal leiomyoma of uterus: Secondary | ICD-10-CM | POA: Diagnosis not present

## 2023-04-05 DIAGNOSIS — C50919 Malignant neoplasm of unspecified site of unspecified female breast: Secondary | ICD-10-CM | POA: Diagnosis not present

## 2023-04-05 DIAGNOSIS — R9389 Abnormal findings on diagnostic imaging of other specified body structures: Secondary | ICD-10-CM | POA: Diagnosis not present

## 2023-04-20 DIAGNOSIS — J45991 Cough variant asthma: Secondary | ICD-10-CM | POA: Diagnosis not present

## 2023-04-21 DIAGNOSIS — Z1379 Encounter for other screening for genetic and chromosomal anomalies: Secondary | ICD-10-CM | POA: Diagnosis not present

## 2023-04-21 DIAGNOSIS — Z5112 Encounter for antineoplastic immunotherapy: Secondary | ICD-10-CM | POA: Diagnosis not present

## 2023-04-21 DIAGNOSIS — E876 Hypokalemia: Secondary | ICD-10-CM | POA: Diagnosis not present

## 2023-04-21 DIAGNOSIS — C7951 Secondary malignant neoplasm of bone: Secondary | ICD-10-CM | POA: Diagnosis not present

## 2023-04-21 DIAGNOSIS — Z5111 Encounter for antineoplastic chemotherapy: Secondary | ICD-10-CM | POA: Diagnosis not present

## 2023-04-21 DIAGNOSIS — C787 Secondary malignant neoplasm of liver and intrahepatic bile duct: Secondary | ICD-10-CM | POA: Diagnosis not present

## 2023-04-21 DIAGNOSIS — C50911 Malignant neoplasm of unspecified site of right female breast: Secondary | ICD-10-CM | POA: Diagnosis not present

## 2023-04-21 DIAGNOSIS — C50912 Malignant neoplasm of unspecified site of left female breast: Secondary | ICD-10-CM | POA: Diagnosis not present

## 2023-05-11 DIAGNOSIS — Z86711 Personal history of pulmonary embolism: Secondary | ICD-10-CM | POA: Diagnosis not present

## 2023-05-11 DIAGNOSIS — G620 Drug-induced polyneuropathy: Secondary | ICD-10-CM | POA: Diagnosis not present

## 2023-05-11 DIAGNOSIS — C7951 Secondary malignant neoplasm of bone: Secondary | ICD-10-CM | POA: Diagnosis not present

## 2023-05-11 DIAGNOSIS — Z5112 Encounter for antineoplastic immunotherapy: Secondary | ICD-10-CM | POA: Diagnosis not present

## 2023-05-11 DIAGNOSIS — C50811 Malignant neoplasm of overlapping sites of right female breast: Secondary | ICD-10-CM | POA: Diagnosis not present

## 2023-05-11 DIAGNOSIS — R748 Abnormal levels of other serum enzymes: Secondary | ICD-10-CM | POA: Diagnosis not present

## 2023-05-11 DIAGNOSIS — C7800 Secondary malignant neoplasm of unspecified lung: Secondary | ICD-10-CM | POA: Diagnosis not present

## 2023-05-11 DIAGNOSIS — C787 Secondary malignant neoplasm of liver and intrahepatic bile duct: Secondary | ICD-10-CM | POA: Diagnosis not present

## 2023-05-11 DIAGNOSIS — Z5111 Encounter for antineoplastic chemotherapy: Secondary | ICD-10-CM | POA: Diagnosis not present

## 2023-05-11 DIAGNOSIS — N8500 Endometrial hyperplasia, unspecified: Secondary | ICD-10-CM | POA: Diagnosis not present

## 2023-05-11 DIAGNOSIS — C50411 Malignant neoplasm of upper-outer quadrant of right female breast: Secondary | ICD-10-CM | POA: Diagnosis not present

## 2023-05-11 DIAGNOSIS — E876 Hypokalemia: Secondary | ICD-10-CM | POA: Diagnosis not present

## 2023-05-12 ENCOUNTER — Ambulatory Visit: Payer: Medicare PPO | Admitting: Family Medicine

## 2023-05-13 ENCOUNTER — Ambulatory Visit: Payer: Medicare PPO | Admitting: Family Medicine

## 2023-05-16 ENCOUNTER — Encounter: Payer: Self-pay | Admitting: Family Medicine

## 2023-05-16 ENCOUNTER — Ambulatory Visit: Payer: Medicare PPO | Admitting: Family Medicine

## 2023-05-16 VITALS — BP 122/76 | HR 80 | Ht 62.0 in | Wt 146.0 lb

## 2023-05-16 DIAGNOSIS — E782 Mixed hyperlipidemia: Secondary | ICD-10-CM

## 2023-05-16 DIAGNOSIS — E119 Type 2 diabetes mellitus without complications: Secondary | ICD-10-CM | POA: Diagnosis not present

## 2023-05-16 DIAGNOSIS — J301 Allergic rhinitis due to pollen: Secondary | ICD-10-CM

## 2023-05-16 DIAGNOSIS — Z7985 Long-term (current) use of injectable non-insulin antidiabetic drugs: Secondary | ICD-10-CM

## 2023-05-16 DIAGNOSIS — R051 Acute cough: Secondary | ICD-10-CM | POA: Diagnosis not present

## 2023-05-16 DIAGNOSIS — R053 Chronic cough: Secondary | ICD-10-CM

## 2023-05-16 DIAGNOSIS — I10 Essential (primary) hypertension: Secondary | ICD-10-CM | POA: Diagnosis not present

## 2023-05-16 DIAGNOSIS — R490 Dysphonia: Secondary | ICD-10-CM

## 2023-05-16 DIAGNOSIS — J45991 Cough variant asthma: Secondary | ICD-10-CM | POA: Diagnosis not present

## 2023-05-16 DIAGNOSIS — E039 Hypothyroidism, unspecified: Secondary | ICD-10-CM

## 2023-05-16 DIAGNOSIS — K219 Gastro-esophageal reflux disease without esophagitis: Secondary | ICD-10-CM

## 2023-05-16 MED ORDER — LIRAGLUTIDE 18 MG/3ML ~~LOC~~ SOPN
PEN_INJECTOR | SUBCUTANEOUS | 1 refills | Status: DC
Start: 2023-05-16 — End: 2023-09-16

## 2023-05-16 MED ORDER — OMEPRAZOLE 40 MG PO CPDR
40.0000 mg | DELAYED_RELEASE_CAPSULE | Freq: Every day | ORAL | 1 refills | Status: DC
Start: 2023-05-16 — End: 2023-12-03

## 2023-05-16 MED ORDER — ATORVASTATIN CALCIUM 10 MG PO TABS
10.0000 mg | ORAL_TABLET | Freq: Every day | ORAL | 1 refills | Status: DC
Start: 2023-05-16 — End: 2023-12-01

## 2023-05-16 MED ORDER — MONTELUKAST SODIUM 10 MG PO TABS
ORAL_TABLET | ORAL | 1 refills | Status: AC
Start: 2023-05-16 — End: ?

## 2023-05-16 MED ORDER — BENZONATATE 100 MG PO CAPS
ORAL_CAPSULE | ORAL | 0 refills | Status: AC
Start: 2023-05-16 — End: ?

## 2023-05-16 MED ORDER — HYDROCHLOROTHIAZIDE 12.5 MG PO TABS: 12.5000 mg | ORAL_TABLET | Freq: Every day | ORAL | 1 refills | Status: DC

## 2023-05-16 MED ORDER — LEVOTHYROXINE SODIUM 50 MCG PO TABS
50.0000 ug | ORAL_TABLET | Freq: Every day | ORAL | 1 refills | Status: DC
Start: 2023-05-16 — End: 2023-09-17

## 2023-05-16 MED ORDER — ALBUTEROL SULFATE HFA 108 (90 BASE) MCG/ACT IN AERS
1.0000 | INHALATION_SPRAY | Freq: Four times a day (QID) | RESPIRATORY_TRACT | 0 refills | Status: AC | PRN
Start: 2023-05-16 — End: 2023-07-15

## 2023-05-16 NOTE — Progress Notes (Signed)
Date:  05/16/2023   Name:  Linda Vazquez   DOB:  04-08-1946   MRN:  [id number]   Chief Complaint: Diabetes, Asthma, Cough, Hypothyroidism, Hypertension, Allergic Rhinitis , and Gastroesophageal Reflux  Diabetes She presents for her follow-up diabetic visit. She has type 2 diabetes mellitus. Her disease course has been stable. There are no hypoglycemic associated symptoms. Pertinent negatives for hypoglycemia include no headaches or nervousness/anxiousness. Pertinent negatives for diabetes include no blurred vision, no chest pain, no polydipsia, no polyuria, no visual change and no weight loss. There are no hypoglycemic complications. Symptoms are stable. There are no diabetic complications. Pertinent negatives for diabetic complications include no CVA. There are no known risk factors for coronary artery disease. Current diabetic treatments: victoza. An ACE inhibitor/angiotensin II receptor blocker is not being taken.  Asthma She complains of cough and frequent throat clearing. There is no chest tightness, difficulty breathing, hemoptysis, hoarse voice, shortness of breath, sputum production or wheezing. This is a chronic problem. The current episode started more than 1 year ago. The problem has been gradually improving. The cough is non-productive. Associated symptoms include nasal congestion, postnasal drip and rhinorrhea. Pertinent negatives include no chest pain, dyspnea on exertion, ear congestion, fever, headaches, heartburn, myalgias, PND, sneezing, sore throat or weight loss. Her symptoms are aggravated by nothing. Her symptoms are alleviated by nothing. She reports moderate improvement on treatment. Her past medical history is significant for asthma.  Cough This is a chronic problem. The cough is Non-productive. Associated symptoms include nasal congestion, postnasal drip and rhinorrhea. Pertinent negatives include no chest pain, ear congestion, fever, headaches, heartburn, hemoptysis,  myalgias, sore throat, shortness of breath, weight loss or wheezing. Her past medical history is significant for asthma.  Hypertension This is a chronic problem. The current episode started more than 1 year ago. The problem has been gradually improving since onset. The problem is controlled. Pertinent negatives include no anxiety, blurred vision, chest pain, headaches, orthopnea, palpitations, peripheral edema, PND or shortness of breath. Risk factors for coronary artery disease include dyslipidemia. There is no history of CAD/MI or CVA. Identifiable causes of hypertension include a thyroid problem. There is no history of chronic renal disease, a hypertension causing med or renovascular disease.  Gastroesophageal Reflux She complains of coughing and nausea. She reports no chest pain, no heartburn, no hoarse voice, no sore throat or no wheezing. Pertinent negatives include no weight loss. She has tried a PPI for the symptoms.  Thyroid Problem Presents for follow-up visit. Symptoms include constipation and hair loss. Patient reports no anxiety, cold intolerance, depressed mood, diaphoresis, diarrhea, dry skin, heat intolerance, hoarse voice, nail problem, palpitations, visual change or weight loss.    Lab Results  Component Value Date   NA 138 04/26/2021   K 4.6 04/26/2021   CO2 28 (A) 04/26/2021   GLUCOSE 101 (H) 12/23/2020   BUN 30 (A) 04/26/2021   CREATININE 1.4 (A) 04/26/2021   CALCIUM 9.7 04/26/2021   EGFR 53 (L) 12/23/2020   GFRNONAA 45 (L) 04/15/2020   Lab Results  Component Value Date   CHOL 181 09/06/2022   HDL 74 09/06/2022   LDLCALC 82 09/06/2022   TRIG 146 09/06/2022   CHOLHDL 3.0 01/17/2019   Lab Results  Component Value Date   TSH 2.060 12/23/2021   Lab Results  Component Value Date   HGBA1C 7.4 (H) 01/14/2023   Lab Results  Component Value Date   WBC 6.4 04/26/2021   HGB 12.8 04/26/2021  HCT 39 04/26/2021   PLT 244 04/26/2021   Lab Results  Component Value  Date   ALT 20 04/26/2021   AST 27 04/26/2021   ALKPHOS 119 04/26/2021   BILITOT 0.5 12/23/2020   No results found for: "25OHVITD2", "25OHVITD3", "VD25OH"   Review of Systems  Constitutional:  Negative for diaphoresis, fever and weight loss.  HENT:  Positive for postnasal drip and rhinorrhea. Negative for hoarse voice, sneezing and sore throat.   Eyes:  Negative for blurred vision.  Respiratory:  Positive for cough. Negative for hemoptysis, sputum production, shortness of breath and wheezing.   Cardiovascular:  Negative for chest pain, dyspnea on exertion, palpitations, orthopnea and PND.  Gastrointestinal:  Positive for constipation and nausea. Negative for diarrhea and heartburn.  Endocrine: Negative for cold intolerance, heat intolerance, polydipsia and polyuria.  Musculoskeletal:  Negative for myalgias.  Neurological:  Negative for headaches.  Psychiatric/Behavioral:  The patient is not nervous/anxious.     Patient Active Problem List   Diagnosis Date Noted   History of 2019 novel coronavirus disease (COVID-19) 07/29/2022   Chemotherapy induced nausea and vomiting 07/02/2022   Arthritis of left knee 04/16/2022   Hypocalcemia 10/12/2021   Diarrhea due to drug 07/12/2021   Hypokalemia 06/01/2021   Chronic right shoulder pain 06/05/2020   Cervical radiculopathy 06/05/2020   Venous stasis dermatitis of left lower extremity 03/07/2019   Venous insufficiency of both lower extremities 03/07/2019   Body mass index (BMI) 33.0-33.9, adult 03/07/2019   Type 2 diabetes mellitus without complication, without long-term current use of insulin (HCC) 01/17/2019   Nontraumatic complete tear of right rotator cuff 12/13/2018   Rotator cuff tendinitis, right 12/13/2018   Genetic testing 08/29/2017   Asthma, cough variant 05/29/2015   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   Chronic cough 10/31/2014   Esophageal dysfunction 09/23/2014   Bony exostosis 12/07/2013   BP (high blood pressure)  06/04/2013   Dysphonia 03/01/2013   Adult hypothyroidism 03/01/2013   Post-tussive emesis 03/01/2013   Malignant neoplasm of breast (HCC) 02/09/2013   Acquired lymphedema 02/09/2013   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    Allergies  Allergen Reactions   Ace Inhibitors Cough    Per pt bp med caused cough possible ace inhibitor   Sulfa Antibiotics Rash    Other Reaction: Not Assessed    Past Surgical History:  Procedure Laterality Date   BIOPSY THYROID     benign   BREAST LUMPECTOMY Right    COLONOSCOPY  2006   repeat in 10 yrs- [facility] Dr   KNEE ARTHROSCOPY WITH MENISCAL REPAIR Bilateral    MASTECTOMY Left    MASTECTOMY Right    2 yrs later   MEDIASTINAL MASS EXCISION     SKIN CANCER EXCISION     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  Current Meds  Medication Sig   ACCU-CHEK GUIDE test strip USE TO TEST EVERY DAY   Accu-Chek Softclix Lancets lancets 100 each by Other  route daily. Use as instructed/ Dx E11.9   amitriptyline (ELAVIL) 10 MG tablet Take 1 tablet by mouth at bedtime as needed. Dr Rica Records   Blood Glucose Monitoring Suppl (ACCU-CHEK GUIDE ME) w/Device KIT    CALCIUM PO Take 650 mg by mouth in the morning and at bedtime.   carbamide peroxide (DEBROX) 6.5 % OTIC solution Place 5 drops into both ears 2 (two) times daily. (Patient taking differently: Place 5 drops into both ears 2 (two) times daily as needed.)   cholecalciferol (VITAMIN D3) 25 MCG (1000 UNIT) tablet Take 1,000 Units by mouth daily.   diphenoxylate-atropine (LOMOTIL)  2.5-0.025 MG tablet Take by mouth.   ELIQUIS DVT/PE STARTER PACK 5 mg 2 (two) times daily.   hydrochlorothiazide (HYDRODIURIL) 12.5 MG tablet Take 1 tablet (12.5 mg total) by mouth daily.   Insulin Pen Needle (BD PEN NEEDLE NANO 2ND GEN) 32G X 4 MM MISC Use as directed   lidocaine-prilocaine (EMLA) cream Apply topically.   MAGNESIUM PO Take by mouth.   naloxone (NARCAN) nasal spray 4 mg/0.1 mL Place into the nose.   OLANZapine (ZYPREXA) 5 MG tablet Take by mouth.   ondansetron (ZOFRAN) 8 MG tablet Take by mouth.   Potassium 99 MG TABS Take 1 tablet by mouth daily.   prochlorperazine (COMPAZINE) 10 MG tablet Take by mouth.   SYMBICORT 160-4.5 MCG/ACT inhaler pulmonology   [DISCONTINUED] hydrochlorothiazide (HYDRODIURIL) 25 MG tablet Take 1 tablet (25 mg total) by mouth daily.   [DISCONTINUED] mometasone (ELOCON) 0.1 % cream Apply 1 Application topically as directed. 3-5 days per week as needed to affected areas of legs   [DISCONTINUED] tretinoin (RETIN-A) 0.025 % cream Apply a pea sized amount to the entire face QHS.       05/16/2023    8:10 AM 01/03/2023   10:31 AM 09/06/2022   10:47 AM 05/03/2022   11:21 AM  GAD 7 : Generalized Anxiety Score  Nervous, Anxious, on Edge 0 0 0 0  Control/stop worrying 0 0 0 0  Worry too much - different things 0 0 0 0  Trouble relaxing 0 0 0 0  Restless 0 0 0 0  Easily annoyed or irritable 0 0 0 0  Afraid - awful might happen 0 0 0 0  Total GAD 7 Score 0 0 0 0  Anxiety Difficulty Not difficult at all Not difficult at all Not difficult at all Not difficult at all       05/16/2023    8:10 AM 03/03/2023   10:01 AM 01/03/2023   10:31 AM  Depression screen PHQ 2/9  Decreased Interest 0 0 0  Down, Depressed, Hopeless 0 0 0  PHQ - 2 Score 0 0 0  Altered sleeping 0 0 0  Tired, decreased energy 0 0 0  Change in appetite 0 0 0  Feeling bad or failure about yourself  0 0 0  Trouble concentrating 0 0 0  Moving slowly or fidgety/restless 0 0 0  Suicidal  thoughts 0 0 0  PHQ-9 Score 0 0 0  Difficult doing work/chores Not difficult at all Not difficult at all Not difficult at all    BP Readings from Last 3 Encounters:  05/16/23 122/76  01/03/23 108/72  09/06/22 128/68    Physical Exam Vitals and nursing note reviewed. Exam conducted with a chaperone present.  Constitutional:      General: She is not in acute distress.    Appearance: She is not diaphoretic.  HENT:  Head: Normocephalic and atraumatic.     Right Ear: External ear normal.     Left Ear: External ear normal.     Nose: Nose normal.  Eyes:     General:        Right eye: No discharge.        Left eye: No discharge.     Conjunctiva/sclera: Conjunctivae normal.     Pupils: Pupils are equal, round, and reactive to light.  Neck:     Thyroid: No thyromegaly.     Vascular: No JVD.  Cardiovascular:     Rate and Rhythm: Normal rate and regular rhythm.     Heart sounds: Normal heart sounds. No murmur heard.    No friction rub. No gallop.  Pulmonary:     Effort: Pulmonary effort is normal.     Breath sounds: Normal breath sounds.  Abdominal:     General: Bowel sounds are normal.     Palpations: Abdomen is soft. There is no mass.     Tenderness: There is no abdominal tenderness. There is no guarding.  Musculoskeletal:        General: Normal range of motion.     Cervical back: Normal range of motion and neck supple.  Lymphadenopathy:     Cervical: No cervical adenopathy.  Skin:    General: Skin is warm and dry.  Neurological:     Mental Status: She is alert.     Deep Tendon Reflexes: Reflexes are normal and symmetric.     Wt Readings from Last 3 Encounters:  05/16/23 146 lb (66.2 kg)  03/03/23 154 lb (69.9 kg)  01/03/23 154 lb (69.9 kg)    BP 122/76   Pulse 80   Ht 5\' 2"  (1.575 m)   Wt 146 lb (66.2 kg)   SpO2 98%   BMI 26.70 kg/m   Assessment and Plan:  1. Long-term current use of injectable noninsulin antidiabetic medication Chronic.  Controlled.   Stable.  Continue Victoza 1.8 mg once a day.  Will check A1c and microalbumin for current level of control. - liraglutide (VICTOZA) 18 MG/3ML SOPN; INJECT 1.8 MG UNDER THE SKIN ONCE DAILY  Dispense: 9 mL; Refill: 1 - HgB A1c - Microalbumin / creatinine urine ratio  2. Asthma, cough variant .  Controlled.  Stable.  Continue albuterol 1 to 2 puffs every 6 hours they have been free encouraged her to take her Singulair 10 mg nightly. - albuterol (VENTOLIN HFA) 108 (90 Base) MCG/ACT inhaler; Inhale 1-2 puffs into the lungs every 6 (six) hours as needed for wheezing or shortness of breath.  Dispense: 2 each; Refill: 0 - montelukast (SINGULAIR) 10 MG tablet; TAKE 1 TABLET BY MOUTH EVERYDAY AT BEDTIME  Dispense: 90 tablet; Refill: 1  3. Acute cough Chronic.  Controlled.  Likely reactive airway disease involved and will reemphasize albuterol and montelukast. - albuterol (VENTOLIN HFA) 108 (90 Base) MCG/ACT inhaler; Inhale 1-2 puffs into the lungs every 6 (six) hours as needed for wheezing or shortness of breath.  Dispense: 2 each; Refill: 0  4. Mixed hyperlipidemia .  Controlled.  Stable.  Continue atorvastatin 10 mg once a day.  Will check lipid panel. - atorvastatin (LIPITOR) 10 MG tablet; Take 1 tablet (10 mg total) by mouth daily.  Dispense: 90 tablet; Refill: 1 - Lipid Panel With LDL/HDL Ratio  5. Chronic cough .  Controlled.  Stable.  Tessalon Perles as needed - benzonatate (TESSALON) 100 MG capsule; TAKE 1 CAPSULE BY MOUTH 2 TIMES DAILY  AS NEEDED.  Dispense: 180 capsule; Refill: 0  6. Dysphonia As noted - benzonatate (TESSALON) 100 MG capsule; TAKE 1 CAPSULE BY MOUTH 2 TIMES DAILY AS NEEDED.  Dispense: 180 capsule; Refill: 0  7. Essential hypertension Chronic.  Controlled.  Stable.  Blood pressure today is 122/76.  Because of decreased potassium we will decrease hydrochlorothiazide to 12.5 mg and will recheck blood pressure in 4 months.  Blood pressure is checked on a regular basis at  oncology. - hydrochlorothiazide (HYDRODIURIL) 12.5 MG tablet; Take 1 tablet (12.5 mg total) by mouth daily.  Dispense: 90 tablet; Refill: 1  8. Adult hypothyroidism Chronic.  Controlled.  Stable.  Continue levothyroxine 50 mcg daily.  Will check thyroid panel with TSH. - levothyroxine (SYNTHROID) 50 MCG tablet; Take 1 tablet (50 mcg total) by mouth daily.  Dispense: 90 tablet; Refill: 1 - Thyroid Panel With TSH  9. 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  Controlled.  Stable.  Continue Singulair 10 mg once a day. - montelukast (SINGULAIR) 10 MG tablet; TAKE 1 TABLET BY MOUTH EVERYDAY AT BEDTIME  Dispense: 90 tablet; Refill: 1  10. Gastroesophageal reflux disease Chronic.  Controlled.  Stable.  Continue omeprazole 40 mg once a day. - omeprazole (PRILOSEC) 40 MG capsule; Take 1 capsule (40 mg total) by mouth daily.  Dispense: 90 capsule; Refill: 1    Elizabeth Sauer, MD

## 2023-05-17 ENCOUNTER — Encounter: Payer: Self-pay | Admitting: Family Medicine

## 2023-05-17 LAB — HEMOGLOBIN A1C
Est. average glucose Bld gHb Est-mCnc: 154 mg/dL
Hgb A1c MFr Bld: 7 % — ABNORMAL HIGH (ref 4.8–5.6)

## 2023-05-17 LAB — LIPID PANEL WITH LDL/HDL RATIO
Cholesterol, Total: 173 mg/dL (ref 100–199)
HDL: 78 mg/dL (ref >39)
LDL Chol Calc (NIH): 77 mg/dL (ref 0–99)
LDL/HDL Ratio: 1 ratio (ref 0.0–3.2)
Triglycerides: 104 mg/dL (ref 0–149)
VLDL Cholesterol Cal: 18 mg/dL (ref 5–40)

## 2023-05-17 LAB — THYROID PANEL WITH TSH
Free Thyroxine Index: 2.7 (ref 1.2–4.9)
T3 Uptake Ratio: 25 % (ref 24–39)
T4, Total: 10.7 ug/dL (ref 4.5–12.0)
TSH: 5.36 u[IU]/mL — ABNORMAL HIGH (ref 0.450–4.500)

## 2023-06-01 DIAGNOSIS — I2699 Other pulmonary embolism without acute cor pulmonale: Secondary | ICD-10-CM | POA: Diagnosis not present

## 2023-06-01 DIAGNOSIS — K5900 Constipation, unspecified: Secondary | ICD-10-CM | POA: Diagnosis not present

## 2023-06-01 DIAGNOSIS — C50911 Malignant neoplasm of unspecified site of right female breast: Secondary | ICD-10-CM | POA: Diagnosis not present

## 2023-06-01 DIAGNOSIS — Z79899 Other long term (current) drug therapy: Secondary | ICD-10-CM | POA: Diagnosis not present

## 2023-06-01 DIAGNOSIS — Z171 Estrogen receptor negative status [ER-]: Secondary | ICD-10-CM | POA: Diagnosis not present

## 2023-06-01 DIAGNOSIS — M792 Neuralgia and neuritis, unspecified: Secondary | ICD-10-CM | POA: Diagnosis not present

## 2023-06-01 DIAGNOSIS — C50411 Malignant neoplasm of upper-outer quadrant of right female breast: Secondary | ICD-10-CM | POA: Diagnosis not present

## 2023-06-01 DIAGNOSIS — R110 Nausea: Secondary | ICD-10-CM | POA: Diagnosis not present

## 2023-06-01 DIAGNOSIS — Z5111 Encounter for antineoplastic chemotherapy: Secondary | ICD-10-CM | POA: Diagnosis not present

## 2023-06-01 DIAGNOSIS — E876 Hypokalemia: Secondary | ICD-10-CM | POA: Diagnosis not present

## 2023-06-01 DIAGNOSIS — C7951 Secondary malignant neoplasm of bone: Secondary | ICD-10-CM | POA: Diagnosis not present

## 2023-06-09 ENCOUNTER — Ambulatory Visit: Payer: Medicare PPO | Admitting: Dermatology

## 2023-06-09 ENCOUNTER — Other Ambulatory Visit: Payer: Self-pay

## 2023-06-09 ENCOUNTER — Telehealth: Payer: Self-pay | Admitting: Family Medicine

## 2023-06-09 DIAGNOSIS — E039 Hypothyroidism, unspecified: Secondary | ICD-10-CM | POA: Diagnosis not present

## 2023-06-09 DIAGNOSIS — E119 Type 2 diabetes mellitus without complications: Secondary | ICD-10-CM

## 2023-06-09 NOTE — Telephone Encounter (Signed)
Called pt let her know her labs are ready for pick up at the front desk when she gets her labs done. Pt verbalized understanding.  KP

## 2023-06-09 NOTE — Telephone Encounter (Signed)
Copied from CRM [id number]. Topic: General - Other >> Jun 09, 2023 10:05 AM [place] B wrote: Reason for CRM: pt called in states was told to come back for labs for urine and thyroid because this wasn't done previously. She wants to know if she needs a form to take with her for this. Can leave message wit info if she can't answer

## 2023-06-10 LAB — TSH: TSH: 1.37 u[IU]/mL (ref 0.450–4.500)

## 2023-06-10 LAB — MICROALBUMIN / CREATININE URINE RATIO
Creatinine, Urine: 71.9 mg/dL
Microalb/Creat Ratio: 18 mg/g creat (ref 0–29)
Microalbumin, Urine: 12.8 ug/mL

## 2023-06-12 ENCOUNTER — Encounter: Payer: Self-pay | Admitting: Family Medicine

## 2023-06-13 ENCOUNTER — Ambulatory Visit: Payer: Self-pay | Admitting: *Deleted

## 2023-06-13 NOTE — Telephone Encounter (Signed)
  Chief Complaint: R ankle swelling Symptoms: swelling in R ankle that comes and goes- will decrease with elevation, no pain, no discloration Frequency: symptoms started with reduction of atorvastatin dosing Pertinent Negatives: Patient denies , fever, chest pain, difficulty breathing, calf pain  Disposition: [] ED /[] Urgent Care (no appt availability in office) / [x] Appointment(In office/virtual)/ []  [facility]/ [] Home Care/ [] Refused Recommended Disposition /[] [facility]/ []  Follow-up with PCP Additional Notes: offered next day appointment- patient states she can not come- provider not in office- Wednesday- patient has been scheduled with alternate provider

## 2023-06-13 NOTE — Telephone Encounter (Signed)
Summary: atorvastatin (LIPITOR) 10 MG tablet? Swelling in ankle   The patient called in wanting to speak with her provider about the medication, atorvastatin (LIPITOR) 10 MG tablet prescribed. She says her right ankle has been swelling and she is not sure if it is due to the change of the medication as her provider had her on the 20mg  but changed it to 10 mg. Please assist patient further     Reason for Disposition  MILD or MODERATE ankle swelling (e.g., can't move joint normally, can't do usual activities) (Exceptions: Itchy, localized swelling; swelling is chronic.)  Answer Assessment - Initial Assessment Questions 1. LOCATION: "Which ankle is swollen?" "Where is the swelling?"     Right ankle 2. ONSET: "When did the swelling start?"     Was taking 20mg  atorvastatin- she decreased to 10mg - patient states she has noticed swelling in ankle 3. SWELLING: "How bad is the swelling?" Or, "How large is it?" (e.g., mild, moderate, severe; size of localized swelling)    - NONE: No joint swelling.   - LOCALIZED: Localized; small area of puffy or swollen skin (e.g., insect bite, skin irritation).   - MILD: Joint looks or feels mildly swollen or puffy.   - MODERATE: Swollen; interferes with normal activities (e.g., work or school); decreased range of movement; may be limping.   - SEVERE: Very swollen; can't move swollen joint at all; limping a lot or unable to walk.     mild 4. PAIN: "Is there any pain?" If Yes, ask: "How bad is it?" (Scale 1-10; or mild, moderate, severe)   - NONE (0): no pain.   - MILD (1-3): doesn't interfere with normal activities.    - MODERATE (4-7): interferes with normal activities (e.g., work or school) or awakens from sleep, limping.    - SEVERE (8-10): excruciating pain, unable to do any normal activities, unable to walk.      None- tightness 5. CAUSE: "What do you think caused the ankle swelling?"     Unsure- patient is on chemo at [facility] 6. OTHER SYMPTOMS: "Do you have any  other symptoms?" (e.g., fever, chest pain, difficulty breathing, calf pain)     no  Protocols used: Ankle Swelling-A-AH

## 2023-06-15 ENCOUNTER — Ambulatory Visit: Payer: Medicare PPO | Admitting: Internal Medicine

## 2023-06-21 DIAGNOSIS — C7801 Secondary malignant neoplasm of right lung: Secondary | ICD-10-CM | POA: Diagnosis not present

## 2023-06-21 DIAGNOSIS — Z171 Estrogen receptor negative status [ER-]: Secondary | ICD-10-CM | POA: Diagnosis not present

## 2023-06-21 DIAGNOSIS — C50919 Malignant neoplasm of unspecified site of unspecified female breast: Secondary | ICD-10-CM | POA: Diagnosis not present

## 2023-06-21 DIAGNOSIS — C7800 Secondary malignant neoplasm of unspecified lung: Secondary | ICD-10-CM | POA: Diagnosis not present

## 2023-06-21 DIAGNOSIS — R590 Localized enlarged lymph nodes: Secondary | ICD-10-CM | POA: Diagnosis not present

## 2023-06-21 DIAGNOSIS — G620 Drug-induced polyneuropathy: Secondary | ICD-10-CM | POA: Diagnosis not present

## 2023-06-21 DIAGNOSIS — Z5111 Encounter for antineoplastic chemotherapy: Secondary | ICD-10-CM | POA: Diagnosis not present

## 2023-06-21 DIAGNOSIS — R9389 Abnormal findings on diagnostic imaging of other specified body structures: Secondary | ICD-10-CM | POA: Diagnosis not present

## 2023-06-21 DIAGNOSIS — C50911 Malignant neoplasm of unspecified site of right female breast: Secondary | ICD-10-CM | POA: Diagnosis not present

## 2023-06-21 DIAGNOSIS — C787 Secondary malignant neoplasm of liver and intrahepatic bile duct: Secondary | ICD-10-CM | POA: Diagnosis not present

## 2023-06-21 DIAGNOSIS — E876 Hypokalemia: Secondary | ICD-10-CM | POA: Diagnosis not present

## 2023-06-21 DIAGNOSIS — Z5112 Encounter for antineoplastic immunotherapy: Secondary | ICD-10-CM | POA: Diagnosis not present

## 2023-06-21 DIAGNOSIS — C7802 Secondary malignant neoplasm of left lung: Secondary | ICD-10-CM | POA: Diagnosis not present

## 2023-06-21 DIAGNOSIS — C7951 Secondary malignant neoplasm of bone: Secondary | ICD-10-CM | POA: Diagnosis not present

## 2023-06-22 DIAGNOSIS — C50919 Malignant neoplasm of unspecified site of unspecified female breast: Secondary | ICD-10-CM | POA: Diagnosis not present

## 2023-07-12 DIAGNOSIS — C50911 Malignant neoplasm of unspecified site of right female breast: Secondary | ICD-10-CM | POA: Diagnosis not present

## 2023-07-12 DIAGNOSIS — Z1731 Human epidermal growth factor receptor 2 positive status: Secondary | ICD-10-CM | POA: Diagnosis not present

## 2023-07-12 DIAGNOSIS — Z5112 Encounter for antineoplastic immunotherapy: Secondary | ICD-10-CM | POA: Diagnosis not present

## 2023-07-12 DIAGNOSIS — C50912 Malignant neoplasm of unspecified site of left female breast: Secondary | ICD-10-CM | POA: Diagnosis not present

## 2023-07-12 DIAGNOSIS — C787 Secondary malignant neoplasm of liver and intrahepatic bile duct: Secondary | ICD-10-CM | POA: Diagnosis not present

## 2023-07-12 DIAGNOSIS — Z171 Estrogen receptor negative status [ER-]: Secondary | ICD-10-CM | POA: Diagnosis not present

## 2023-07-12 DIAGNOSIS — C7800 Secondary malignant neoplasm of unspecified lung: Secondary | ICD-10-CM | POA: Diagnosis not present

## 2023-07-12 DIAGNOSIS — Z23 Encounter for immunization: Secondary | ICD-10-CM | POA: Diagnosis not present

## 2023-07-12 DIAGNOSIS — Z5111 Encounter for antineoplastic chemotherapy: Secondary | ICD-10-CM | POA: Diagnosis not present

## 2023-07-12 DIAGNOSIS — E109 Type 1 diabetes mellitus without complications: Secondary | ICD-10-CM | POA: Diagnosis not present

## 2023-07-12 DIAGNOSIS — G620 Drug-induced polyneuropathy: Secondary | ICD-10-CM | POA: Diagnosis not present

## 2023-07-12 DIAGNOSIS — C7951 Secondary malignant neoplasm of bone: Secondary | ICD-10-CM | POA: Diagnosis not present

## 2023-07-21 DIAGNOSIS — N1831 Chronic kidney disease, stage 3a: Secondary | ICD-10-CM | POA: Diagnosis not present

## 2023-07-21 DIAGNOSIS — C50919 Malignant neoplasm of unspecified site of unspecified female breast: Secondary | ICD-10-CM | POA: Diagnosis not present

## 2023-07-21 DIAGNOSIS — K5900 Constipation, unspecified: Secondary | ICD-10-CM | POA: Diagnosis not present

## 2023-07-21 DIAGNOSIS — Z823 Family history of stroke: Secondary | ICD-10-CM | POA: Diagnosis not present

## 2023-07-21 DIAGNOSIS — K224 Dyskinesia of esophagus: Secondary | ICD-10-CM | POA: Diagnosis not present

## 2023-07-21 DIAGNOSIS — I951 Orthostatic hypotension: Secondary | ICD-10-CM | POA: Diagnosis not present

## 2023-07-21 DIAGNOSIS — I129 Hypertensive chronic kidney disease with stage 1 through stage 4 chronic kidney disease, or unspecified chronic kidney disease: Secondary | ICD-10-CM | POA: Diagnosis not present

## 2023-07-21 DIAGNOSIS — E785 Hyperlipidemia, unspecified: Secondary | ICD-10-CM | POA: Diagnosis not present

## 2023-07-21 DIAGNOSIS — Z809 Family history of malignant neoplasm, unspecified: Secondary | ICD-10-CM | POA: Diagnosis not present

## 2023-07-21 DIAGNOSIS — Z79899 Other long term (current) drug therapy: Secondary | ICD-10-CM | POA: Diagnosis not present

## 2023-07-21 DIAGNOSIS — E1122 Type 2 diabetes mellitus with diabetic chronic kidney disease: Secondary | ICD-10-CM | POA: Diagnosis not present

## 2023-07-21 DIAGNOSIS — Z8711 Personal history of peptic ulcer disease: Secondary | ICD-10-CM | POA: Diagnosis not present

## 2023-07-21 DIAGNOSIS — G620 Drug-induced polyneuropathy: Secondary | ICD-10-CM | POA: Diagnosis not present

## 2023-07-21 DIAGNOSIS — J45909 Unspecified asthma, uncomplicated: Secondary | ICD-10-CM | POA: Diagnosis not present

## 2023-07-21 DIAGNOSIS — K219 Gastro-esophageal reflux disease without esophagitis: Secondary | ICD-10-CM | POA: Diagnosis not present

## 2023-07-21 DIAGNOSIS — E039 Hypothyroidism, unspecified: Secondary | ICD-10-CM | POA: Diagnosis not present

## 2023-07-21 DIAGNOSIS — J383 Other diseases of vocal cords: Secondary | ICD-10-CM | POA: Diagnosis not present

## 2023-07-21 DIAGNOSIS — Z85828 Personal history of other malignant neoplasm of skin: Secondary | ICD-10-CM | POA: Diagnosis not present

## 2023-07-21 DIAGNOSIS — D84822 Immunodeficiency due to external causes: Secondary | ICD-10-CM | POA: Diagnosis not present

## 2023-07-21 DIAGNOSIS — Z7901 Long term (current) use of anticoagulants: Secondary | ICD-10-CM | POA: Diagnosis not present

## 2023-08-02 DIAGNOSIS — R112 Nausea with vomiting, unspecified: Secondary | ICD-10-CM | POA: Diagnosis not present

## 2023-08-02 DIAGNOSIS — G620 Drug-induced polyneuropathy: Secondary | ICD-10-CM | POA: Diagnosis not present

## 2023-08-02 DIAGNOSIS — R9389 Abnormal findings on diagnostic imaging of other specified body structures: Secondary | ICD-10-CM | POA: Diagnosis not present

## 2023-08-02 DIAGNOSIS — Z1731 Human epidermal growth factor receptor 2 positive status: Secondary | ICD-10-CM | POA: Diagnosis not present

## 2023-08-02 DIAGNOSIS — C7951 Secondary malignant neoplasm of bone: Secondary | ICD-10-CM | POA: Diagnosis not present

## 2023-08-02 DIAGNOSIS — C50911 Malignant neoplasm of unspecified site of right female breast: Secondary | ICD-10-CM | POA: Diagnosis not present

## 2023-08-02 DIAGNOSIS — Z86711 Personal history of pulmonary embolism: Secondary | ICD-10-CM | POA: Diagnosis not present

## 2023-08-02 DIAGNOSIS — Z170 Estrogen receptor positive status [ER+]: Secondary | ICD-10-CM | POA: Diagnosis not present

## 2023-08-02 DIAGNOSIS — Z5112 Encounter for antineoplastic immunotherapy: Secondary | ICD-10-CM | POA: Diagnosis not present

## 2023-08-02 DIAGNOSIS — T451X5A Adverse effect of antineoplastic and immunosuppressive drugs, initial encounter: Secondary | ICD-10-CM | POA: Diagnosis not present

## 2023-08-02 DIAGNOSIS — Z5111 Encounter for antineoplastic chemotherapy: Secondary | ICD-10-CM | POA: Diagnosis not present

## 2023-08-02 DIAGNOSIS — C50919 Malignant neoplasm of unspecified site of unspecified female breast: Secondary | ICD-10-CM | POA: Diagnosis not present

## 2023-08-02 DIAGNOSIS — C7800 Secondary malignant neoplasm of unspecified lung: Secondary | ICD-10-CM | POA: Diagnosis not present

## 2023-08-02 DIAGNOSIS — C787 Secondary malignant neoplasm of liver and intrahepatic bile duct: Secondary | ICD-10-CM | POA: Diagnosis not present

## 2023-08-19 DIAGNOSIS — K769 Liver disease, unspecified: Secondary | ICD-10-CM | POA: Diagnosis not present

## 2023-08-19 DIAGNOSIS — C7951 Secondary malignant neoplasm of bone: Secondary | ICD-10-CM | POA: Diagnosis not present

## 2023-08-19 DIAGNOSIS — C7800 Secondary malignant neoplasm of unspecified lung: Secondary | ICD-10-CM | POA: Diagnosis not present

## 2023-08-19 DIAGNOSIS — C50919 Malignant neoplasm of unspecified site of unspecified female breast: Secondary | ICD-10-CM | POA: Diagnosis not present

## 2023-08-19 DIAGNOSIS — K7689 Other specified diseases of liver: Secondary | ICD-10-CM | POA: Diagnosis not present

## 2023-08-19 DIAGNOSIS — R918 Other nonspecific abnormal finding of lung field: Secondary | ICD-10-CM | POA: Diagnosis not present

## 2023-08-23 DIAGNOSIS — C7800 Secondary malignant neoplasm of unspecified lung: Secondary | ICD-10-CM | POA: Diagnosis not present

## 2023-08-23 DIAGNOSIS — C7951 Secondary malignant neoplasm of bone: Secondary | ICD-10-CM | POA: Diagnosis not present

## 2023-08-23 DIAGNOSIS — C50912 Malignant neoplasm of unspecified site of left female breast: Secondary | ICD-10-CM | POA: Diagnosis not present

## 2023-08-23 DIAGNOSIS — I2699 Other pulmonary embolism without acute cor pulmonale: Secondary | ICD-10-CM | POA: Diagnosis not present

## 2023-08-23 DIAGNOSIS — C50911 Malignant neoplasm of unspecified site of right female breast: Secondary | ICD-10-CM | POA: Diagnosis not present

## 2023-08-23 DIAGNOSIS — C787 Secondary malignant neoplasm of liver and intrahepatic bile duct: Secondary | ICD-10-CM | POA: Diagnosis not present

## 2023-08-23 DIAGNOSIS — D696 Thrombocytopenia, unspecified: Secondary | ICD-10-CM | POA: Diagnosis not present

## 2023-08-23 DIAGNOSIS — G620 Drug-induced polyneuropathy: Secondary | ICD-10-CM | POA: Diagnosis not present

## 2023-08-26 DIAGNOSIS — C50919 Malignant neoplasm of unspecified site of unspecified female breast: Secondary | ICD-10-CM | POA: Diagnosis not present

## 2023-08-26 DIAGNOSIS — I340 Nonrheumatic mitral (valve) insufficiency: Secondary | ICD-10-CM | POA: Diagnosis not present

## 2023-08-26 DIAGNOSIS — I071 Rheumatic tricuspid insufficiency: Secondary | ICD-10-CM | POA: Diagnosis not present

## 2023-08-26 DIAGNOSIS — I081 Rheumatic disorders of both mitral and tricuspid valves: Secondary | ICD-10-CM | POA: Diagnosis not present

## 2023-09-04 ENCOUNTER — Other Ambulatory Visit: Payer: Self-pay | Admitting: Family Medicine

## 2023-09-04 DIAGNOSIS — I10 Essential (primary) hypertension: Secondary | ICD-10-CM

## 2023-09-05 DIAGNOSIS — Z5111 Encounter for antineoplastic chemotherapy: Secondary | ICD-10-CM | POA: Diagnosis not present

## 2023-09-05 DIAGNOSIS — C50919 Malignant neoplasm of unspecified site of unspecified female breast: Secondary | ICD-10-CM | POA: Diagnosis not present

## 2023-09-05 DIAGNOSIS — Z79899 Other long term (current) drug therapy: Secondary | ICD-10-CM | POA: Diagnosis not present

## 2023-09-05 DIAGNOSIS — Z5112 Encounter for antineoplastic immunotherapy: Secondary | ICD-10-CM | POA: Diagnosis not present

## 2023-09-12 DIAGNOSIS — C50919 Malignant neoplasm of unspecified site of unspecified female breast: Secondary | ICD-10-CM | POA: Diagnosis not present

## 2023-09-12 DIAGNOSIS — Z5111 Encounter for antineoplastic chemotherapy: Secondary | ICD-10-CM | POA: Diagnosis not present

## 2023-09-16 ENCOUNTER — Ambulatory Visit: Payer: Medicare PPO | Admitting: Family Medicine

## 2023-09-16 ENCOUNTER — Encounter: Payer: Self-pay | Admitting: Family Medicine

## 2023-09-16 VITALS — BP 120/72 | HR 74 | Ht 62.0 in | Wt 145.0 lb

## 2023-09-16 DIAGNOSIS — Z7985 Long-term (current) use of injectable non-insulin antidiabetic drugs: Secondary | ICD-10-CM | POA: Diagnosis not present

## 2023-09-16 DIAGNOSIS — E039 Hypothyroidism, unspecified: Secondary | ICD-10-CM | POA: Diagnosis not present

## 2023-09-16 MED ORDER — LIRAGLUTIDE 18 MG/3ML ~~LOC~~ SOPN: PEN_INJECTOR | SUBCUTANEOUS | 1 refills | Status: DC

## 2023-09-16 NOTE — Progress Notes (Signed)
Date:  09/16/2023   Name:  Linda Vazquez   DOB:  1945-12-09   MRN:  [id number]   Chief Complaint: Diabetes  Diabetes She presents for her follow-up diabetic visit. She has type 2 diabetes mellitus. Her disease course has been stable. There are no hypoglycemic associated symptoms. Pertinent negatives for hypoglycemia include no confusion, dizziness, headaches, nervousness/anxiousness or tremors. Associated symptoms include fatigue and weight loss. Pertinent negatives for diabetes include no blurred vision, no chest pain, no foot paresthesias, no foot ulcerations, no polydipsia, no polyuria, no visual change and no weakness. There are no hypoglycemic complications. Symptoms are stable. Pertinent negatives for diabetic complications include no CVA or PVD. Risk factors for coronary artery disease include diabetes mellitus. Her weight is stable. She is following a generally healthy diet. Meal planning includes avoidance of concentrated sweets. Her home blood glucose trend is decreasing rapidly. Her breakfast blood glucose is taken between 8-9 am. Her breakfast blood glucose range is generally 110-130 mg/dl.  Thyroid Problem Presents for follow-up visit. Symptoms include fatigue, hoarse voice and weight loss. Patient reports no anxiety, cold intolerance, constipation, depressed mood, diaphoresis, diarrhea, dry skin, hair loss, heat intolerance, leg swelling, menstrual problem, nail problem, palpitations, tremors, visual change or weight gain. The symptoms have been stable.    Lab Results  Component Value Date   NA 138 04/26/2021   K 4.6 04/26/2021   CO2 28 (A) 04/26/2021   GLUCOSE 101 (H) 12/23/2020   BUN 30 (A) 04/26/2021   CREATININE 1.4 (A) 04/26/2021   CALCIUM 9.7 04/26/2021   EGFR 53 (L) 12/23/2020   GFRNONAA 45 (L) 04/15/2020   Lab Results  Component Value Date   CHOL 173 05/16/2023   HDL 78 05/16/2023   LDLCALC 77 05/16/2023   TRIG 104 05/16/2023   CHOLHDL 3.0 01/17/2019    Lab Results  Component Value Date   TSH 1.370 06/09/2023   Lab Results  Component Value Date   HGBA1C 7.0 (H) 05/16/2023   Lab Results  Component Value Date   WBC 6.4 04/26/2021   HGB 12.8 04/26/2021   HCT 39 04/26/2021   PLT 244 04/26/2021   Lab Results  Component Value Date   ALT 20 04/26/2021   AST 27 04/26/2021   ALKPHOS 119 04/26/2021   BILITOT 0.5 12/23/2020   No results found for: "25OHVITD2", "25OHVITD3", "VD25OH"   Review of Systems  Constitutional:  Positive for fatigue and weight loss. Negative for diaphoresis, fever and weight gain.  HENT:  Positive for hoarse voice.   Eyes:  Negative for blurred vision.  Respiratory:  Negative for cough, shortness of breath and wheezing.   Cardiovascular:  Negative for chest pain, palpitations and leg swelling.  Gastrointestinal:  Negative for constipation and diarrhea.  Endocrine: Negative for cold intolerance, heat intolerance, polydipsia and polyuria.  Genitourinary:  Negative for menstrual problem.  Neurological:  Negative for dizziness, tremors, weakness and headaches.  Psychiatric/Behavioral:  Negative for confusion. The patient is not nervous/anxious.     Patient Active Problem List   Diagnosis Date Noted   History of 2019 novel coronavirus disease (COVID-19) 07/29/2022   Chemotherapy induced nausea and vomiting 07/02/2022   Arthritis of left knee 04/16/2022   Hypocalcemia 10/12/2021   Diarrhea due to drug 07/12/2021   Hypokalemia 06/01/2021   Chronic right shoulder pain 06/05/2020   Cervical radiculopathy 06/05/2020   Venous stasis dermatitis of left lower extremity 03/07/2019   Venous insufficiency of both lower extremities 03/07/2019   Body  mass index (BMI) 33.0-33.9, adult 03/07/2019   Type 2 diabetes mellitus without complication, without long-term current use of insulin (HCC) 01/17/2019   Nontraumatic complete tear of right rotator cuff 12/13/2018   Rotator cuff tendinitis, right 12/13/2018   Genetic  testing 08/29/2017   Asthma, cough variant 05/29/2015   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   Chronic cough 10/31/2014   Esophageal dysfunction 09/23/2014   Bony exostosis 12/07/2013   BP (high blood pressure) 06/04/2013   Dysphonia 03/01/2013   Adult hypothyroidism 03/01/2013   Post-tussive emesis 03/01/2013   Malignant neoplasm of breast (HCC) 02/09/2013   Acquired lymphedema 02/09/2013   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    Allergies  Allergen Reactions   Ace Inhibitors Cough    Per pt bp med caused cough possible ace inhibitor   Sulfa Antibiotics Rash    Other Reaction: Not Assessed    Past Surgical History:  Procedure Laterality Date   BIOPSY THYROID     benign   BREAST LUMPECTOMY Right    COLONOSCOPY  2006   repeat in 10 yrs- [facility]   KNEE ARTHROSCOPY WITH MENISCAL REPAIR Bilateral    MASTECTOMY Left    MASTECTOMY Right    2 yrs later   MEDIASTINAL MASS EXCISION     SKIN CANCER EXCISION     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  Current Meds  Medication Sig   ACCU-CHEK GUIDE test strip USE TO TEST EVERY DAY   Accu-Chek Softclix Lancets lancets 100 each by Other route daily. Use as instructed/ Dx E11.9   atorvastatin (LIPITOR) 10 MG tablet Take 1 tablet (10 mg total) by mouth daily.   benzonatate (TESSALON) 100 MG capsule TAKE 1 CAPSULE BY MOUTH 2 TIMES DAILY AS NEEDED.   Blood Glucose Monitoring Suppl (ACCU-CHEK GUIDE ME) w/Device KIT    CALCIUM PO Take 650 mg by mouth in the morning and at bedtime.   carbamide peroxide (DEBROX)  6.5 % OTIC solution Place 5 drops into both ears 2 (two) times daily. (Patient taking differently: Place 5 drops into both ears 2 (two) times daily as needed.)   cholecalciferol (VITAMIN D3) 25 MCG (1000 UNIT) tablet Take 1,000 Units by mouth daily.   diphenoxylate-atropine (LOMOTIL) 2.5-0.025 MG tablet Take by mouth.   hydrochlorothiazide (HYDRODIURIL) 12.5 MG tablet TAKE 1 TABLET BY MOUTH EVERY DAY   Insulin Pen Needle (BD PEN NEEDLE NANO 2ND GEN) 32G X 4 MM MISC Use as directed   levothyroxine (SYNTHROID) 50 MCG tablet Take 1 tablet (50 mcg total) by mouth daily.   lidocaine-prilocaine (EMLA) cream Apply topically.   liraglutide (VICTOZA) 18 MG/3ML SOPN INJECT 1.8 MG UNDER THE SKIN ONCE DAILY   MAGNESIUM PO Take by mouth.   montelukast (SINGULAIR) 10 MG tablet TAKE 1 TABLET BY MOUTH EVERYDAY AT BEDTIME   naloxone (NARCAN) nasal spray 4 mg/0.1 mL Place into the nose.   OLANZapine (ZYPREXA) 5 MG tablet Take by mouth.   omeprazole (PRILOSEC)  40 MG capsule Take 1 capsule (40 mg total) by mouth daily.   ondansetron (ZOFRAN) 8 MG tablet Take by mouth.   Potassium 99 MG TABS Take 1 tablet by mouth daily.   prochlorperazine (COMPAZINE) 10 MG tablet Take by mouth.   SYMBICORT 160-4.5 MCG/ACT inhaler pulmonology       09/16/2023    8:18 AM 05/16/2023    8:10 AM 01/03/2023   10:31 AM 09/06/2022   10:47 AM  GAD 7 : Generalized Anxiety Score  Nervous, Anxious, on Edge 0 0 0 0  Control/stop worrying 0 0 0 0  Worry too much - different things 0 0 0 0  Trouble relaxing 0 0 0 0  Restless 0 0 0 0  Easily annoyed or irritable 0 0 0 0  Afraid - awful might happen 0 0 0 0  Total GAD 7 Score 0 0 0 0  Anxiety Difficulty Not difficult at all Not difficult at all Not difficult at all Not difficult at all       09/16/2023    8:17 AM 05/16/2023    8:10 AM 03/03/2023   10:01 AM  Depression screen PHQ 2/9  Decreased Interest 0 0 0  Down, Depressed, Hopeless 0 0 0  PHQ - 2 Score 0 0 0  Altered  sleeping 0 0 0  Tired, decreased energy 3 0 0  Change in appetite 0 0 0  Feeling bad or failure about yourself  0 0 0  Trouble concentrating 0 0 0  Moving slowly or fidgety/restless 0 0 0  Suicidal thoughts 0 0 0  PHQ-9 Score 3 0 0  Difficult doing work/chores Not difficult at all Not difficult at all Not difficult at all    BP Readings from Last 3 Encounters:  09/16/23 120/72  05/16/23 122/76  01/03/23 108/72    Physical Exam Vitals and nursing note reviewed. Exam conducted with a chaperone present.  Constitutional:      General: She is not in acute distress.    Appearance: She is not diaphoretic.  HENT:     Head: Normocephalic and atraumatic.     Right Ear: Tympanic membrane and external ear normal.     Left Ear: Tympanic membrane and external ear normal.     Nose: Nose normal.  Eyes:     General:        Right eye: No discharge.        Left eye: No discharge.     Conjunctiva/sclera: Conjunctivae normal.     Pupils: Pupils are equal, round, and reactive to light.  Neck:     Thyroid: No thyromegaly.     Vascular: No JVD.  Cardiovascular:     Rate and Rhythm: Normal rate and regular rhythm.     Heart sounds: Normal heart sounds, S1 normal and S2 normal. No murmur heard.    No systolic murmur is present.     No diastolic murmur is present.     No friction rub. No gallop. No S3 or S4 sounds.  Pulmonary:     Effort: Pulmonary effort is normal.     Breath sounds: Normal breath sounds.  Abdominal:     General: Bowel sounds are normal.     Palpations: Abdomen is soft. There is no mass.     Tenderness: There is no abdominal tenderness. There is no guarding.  Musculoskeletal:        General: Normal range of motion.     Cervical back: Normal range of  motion and neck supple.  Lymphadenopathy:     Cervical: No cervical adenopathy.  Skin:    General: Skin is warm and dry.  Neurological:     Mental Status: She is alert.     Deep Tendon Reflexes: Reflexes are normal and  symmetric.     Wt Readings from Last 3 Encounters:  09/16/23 145 lb (65.8 kg)  05/16/23 146 lb (66.2 kg)  03/03/23 154 lb (69.9 kg)    BP 120/72   Pulse 74   Ht 5\' 2"  (1.575 m)   Wt 145 lb (65.8 kg)   SpO2 99%   BMI 26.52 kg/m   Assessment and Plan: 1. Long-term current use of injectable noninsulin antidiabetic medication (Primary) Chronic.  Controlled.  Stable.  Continue Victoza 1.8 mg daily.  Will check renal function panel for fasting glucose and A1c for 8 control of diabetes as well.  Patient is having appetite changes and has lost some weight so we will reaffirm that her diabetes is not of concern for this weight loss as well as reviewing thyroid concerns below.  Recheck in 4 to 6 months - liraglutide (VICTOZA) 18 MG/3ML SOPN; INJECT 1.8 MG UNDER THE SKIN ONCE DAILY  Dispense: 9 mL; Refill: 1 - Renal Function Panel - Hemoglobin A1c  2. Adult hypothyroidism Chronic.  Controlled.  Stable.  Tolerating current dosing.  And will recheck and 4 to 6 months patient notes that her weight has decreased and this may be due to may be a little over correction of her thyroid condition.  We will check a TSH to evaluate this concern. - TSH      Elizabeth Sauer, MD

## 2023-09-17 ENCOUNTER — Encounter: Payer: Self-pay | Admitting: Family Medicine

## 2023-09-17 ENCOUNTER — Other Ambulatory Visit: Payer: Self-pay | Admitting: Family Medicine

## 2023-09-17 LAB — RENAL FUNCTION PANEL
Albumin: 3.9 g/dL (ref 3.8–4.8)
BUN/Creatinine Ratio: 18 (ref 12–28)
BUN: 21 mg/dL (ref 8–27)
CO2: 22 mmol/L (ref 20–29)
Calcium: 8.7 mg/dL (ref 8.7–10.3)
Chloride: 103 mmol/L (ref 96–106)
Creatinine, Ser: 1.14 mg/dL — ABNORMAL HIGH (ref 0.57–1.00)
Glucose: 104 mg/dL — ABNORMAL HIGH (ref 70–99)
Phosphorus: 3.7 mg/dL (ref 3.0–4.3)
Potassium: 3.9 mmol/L (ref 3.5–5.2)
Sodium: 143 mmol/L (ref 134–144)
eGFR: 49 mL/min/{1.73_m2} — ABNORMAL LOW (ref >59)

## 2023-09-17 LAB — HEMOGLOBIN A1C
Est. average glucose Bld gHb Est-mCnc: 146 mg/dL
Hgb A1c MFr Bld: 6.7 % — ABNORMAL HIGH (ref 4.8–5.6)

## 2023-09-17 LAB — TSH: TSH: 4.6 u[IU]/mL — ABNORMAL HIGH (ref 0.450–4.500)

## 2023-09-17 MED ORDER — LEVOTHYROXINE SODIUM 75 MCG PO TABS: 75.0000 ug | ORAL_TABLET | Freq: Every day | ORAL | 0 refills | Status: DC

## 2023-09-27 DIAGNOSIS — Z5111 Encounter for antineoplastic chemotherapy: Secondary | ICD-10-CM | POA: Diagnosis not present

## 2023-09-27 DIAGNOSIS — G620 Drug-induced polyneuropathy: Secondary | ICD-10-CM | POA: Diagnosis not present

## 2023-09-27 DIAGNOSIS — Z853 Personal history of malignant neoplasm of breast: Secondary | ICD-10-CM | POA: Diagnosis not present

## 2023-09-27 DIAGNOSIS — R9389 Abnormal findings on diagnostic imaging of other specified body structures: Secondary | ICD-10-CM | POA: Diagnosis not present

## 2023-09-27 DIAGNOSIS — Z7901 Long term (current) use of anticoagulants: Secondary | ICD-10-CM | POA: Diagnosis not present

## 2023-09-27 DIAGNOSIS — C7951 Secondary malignant neoplasm of bone: Secondary | ICD-10-CM | POA: Diagnosis not present

## 2023-09-27 DIAGNOSIS — C7800 Secondary malignant neoplasm of unspecified lung: Secondary | ICD-10-CM | POA: Diagnosis not present

## 2023-09-27 DIAGNOSIS — Z5112 Encounter for antineoplastic immunotherapy: Secondary | ICD-10-CM | POA: Diagnosis not present

## 2023-09-27 DIAGNOSIS — C50911 Malignant neoplasm of unspecified site of right female breast: Secondary | ICD-10-CM | POA: Diagnosis not present

## 2023-09-27 DIAGNOSIS — E876 Hypokalemia: Secondary | ICD-10-CM | POA: Diagnosis not present

## 2023-09-27 DIAGNOSIS — D696 Thrombocytopenia, unspecified: Secondary | ICD-10-CM | POA: Diagnosis not present

## 2023-09-27 DIAGNOSIS — C787 Secondary malignant neoplasm of liver and intrahepatic bile duct: Secondary | ICD-10-CM | POA: Diagnosis not present

## 2023-09-28 DIAGNOSIS — D702 Other drug-induced agranulocytosis: Secondary | ICD-10-CM | POA: Diagnosis not present

## 2023-09-29 DIAGNOSIS — C50919 Malignant neoplasm of unspecified site of unspecified female breast: Secondary | ICD-10-CM | POA: Diagnosis not present

## 2023-10-04 DIAGNOSIS — C50919 Malignant neoplasm of unspecified site of unspecified female breast: Secondary | ICD-10-CM | POA: Diagnosis not present

## 2023-10-12 DIAGNOSIS — C50911 Malignant neoplasm of unspecified site of right female breast: Secondary | ICD-10-CM | POA: Diagnosis not present

## 2023-10-12 DIAGNOSIS — Z1379 Encounter for other screening for genetic and chromosomal anomalies: Secondary | ICD-10-CM | POA: Diagnosis not present

## 2023-10-12 DIAGNOSIS — G620 Drug-induced polyneuropathy: Secondary | ICD-10-CM | POA: Diagnosis not present

## 2023-10-12 DIAGNOSIS — C50919 Malignant neoplasm of unspecified site of unspecified female breast: Secondary | ICD-10-CM | POA: Diagnosis not present

## 2023-10-12 DIAGNOSIS — Z6827 Body mass index (BMI) 27.0-27.9, adult: Secondary | ICD-10-CM | POA: Diagnosis not present

## 2023-10-12 DIAGNOSIS — Z23 Encounter for immunization: Secondary | ICD-10-CM | POA: Diagnosis not present

## 2023-10-12 DIAGNOSIS — C787 Secondary malignant neoplasm of liver and intrahepatic bile duct: Secondary | ICD-10-CM | POA: Diagnosis not present

## 2023-10-12 DIAGNOSIS — C7800 Secondary malignant neoplasm of unspecified lung: Secondary | ICD-10-CM | POA: Diagnosis not present

## 2023-10-12 DIAGNOSIS — C7951 Secondary malignant neoplasm of bone: Secondary | ICD-10-CM | POA: Diagnosis not present

## 2023-10-12 DIAGNOSIS — R634 Abnormal weight loss: Secondary | ICD-10-CM | POA: Diagnosis not present

## 2023-10-12 DIAGNOSIS — J383 Other diseases of vocal cords: Secondary | ICD-10-CM | POA: Diagnosis not present

## 2023-10-12 DIAGNOSIS — D696 Thrombocytopenia, unspecified: Secondary | ICD-10-CM | POA: Diagnosis not present

## 2023-10-12 DIAGNOSIS — Z5111 Encounter for antineoplastic chemotherapy: Secondary | ICD-10-CM | POA: Diagnosis not present

## 2023-10-12 DIAGNOSIS — Z5112 Encounter for antineoplastic immunotherapy: Secondary | ICD-10-CM | POA: Diagnosis not present

## 2023-10-18 DIAGNOSIS — C50919 Malignant neoplasm of unspecified site of unspecified female breast: Secondary | ICD-10-CM | POA: Diagnosis not present

## 2023-10-18 DIAGNOSIS — Z5111 Encounter for antineoplastic chemotherapy: Secondary | ICD-10-CM | POA: Diagnosis not present

## 2023-11-01 DIAGNOSIS — K1370 Unspecified lesions of oral mucosa: Secondary | ICD-10-CM | POA: Diagnosis not present

## 2023-11-01 DIAGNOSIS — C7951 Secondary malignant neoplasm of bone: Secondary | ICD-10-CM | POA: Diagnosis not present

## 2023-11-01 DIAGNOSIS — C50911 Malignant neoplasm of unspecified site of right female breast: Secondary | ICD-10-CM | POA: Diagnosis not present

## 2023-11-01 DIAGNOSIS — C787 Secondary malignant neoplasm of liver and intrahepatic bile duct: Secondary | ICD-10-CM | POA: Diagnosis not present

## 2023-11-01 DIAGNOSIS — Z5111 Encounter for antineoplastic chemotherapy: Secondary | ICD-10-CM | POA: Diagnosis not present

## 2023-11-01 DIAGNOSIS — E876 Hypokalemia: Secondary | ICD-10-CM | POA: Diagnosis not present

## 2023-11-01 DIAGNOSIS — G620 Drug-induced polyneuropathy: Secondary | ICD-10-CM | POA: Diagnosis not present

## 2023-11-01 DIAGNOSIS — D709 Neutropenia, unspecified: Secondary | ICD-10-CM | POA: Diagnosis not present

## 2023-11-01 DIAGNOSIS — C7800 Secondary malignant neoplasm of unspecified lung: Secondary | ICD-10-CM | POA: Diagnosis not present

## 2023-11-01 DIAGNOSIS — K1379 Other lesions of oral mucosa: Secondary | ICD-10-CM | POA: Diagnosis not present

## 2023-11-01 DIAGNOSIS — K5900 Constipation, unspecified: Secondary | ICD-10-CM | POA: Diagnosis not present

## 2023-11-01 DIAGNOSIS — R609 Edema, unspecified: Secondary | ICD-10-CM | POA: Diagnosis not present

## 2023-11-01 DIAGNOSIS — Z5112 Encounter for antineoplastic immunotherapy: Secondary | ICD-10-CM | POA: Diagnosis not present

## 2023-11-01 DIAGNOSIS — T451X5A Adverse effect of antineoplastic and immunosuppressive drugs, initial encounter: Secondary | ICD-10-CM | POA: Diagnosis not present

## 2023-11-03 DIAGNOSIS — Z5111 Encounter for antineoplastic chemotherapy: Secondary | ICD-10-CM | POA: Diagnosis not present

## 2023-11-03 DIAGNOSIS — Z5112 Encounter for antineoplastic immunotherapy: Secondary | ICD-10-CM | POA: Diagnosis not present

## 2023-11-03 DIAGNOSIS — C799 Secondary malignant neoplasm of unspecified site: Secondary | ICD-10-CM | POA: Diagnosis not present

## 2023-11-03 DIAGNOSIS — C50919 Malignant neoplasm of unspecified site of unspecified female breast: Secondary | ICD-10-CM | POA: Diagnosis not present

## 2023-11-10 DIAGNOSIS — Z5111 Encounter for antineoplastic chemotherapy: Secondary | ICD-10-CM | POA: Diagnosis not present

## 2023-11-10 DIAGNOSIS — Z79899 Other long term (current) drug therapy: Secondary | ICD-10-CM | POA: Diagnosis not present

## 2023-11-10 DIAGNOSIS — C50911 Malignant neoplasm of unspecified site of right female breast: Secondary | ICD-10-CM | POA: Diagnosis not present

## 2023-11-10 DIAGNOSIS — C50912 Malignant neoplasm of unspecified site of left female breast: Secondary | ICD-10-CM | POA: Diagnosis not present

## 2023-11-13 ENCOUNTER — Other Ambulatory Visit: Payer: Self-pay | Admitting: Family Medicine

## 2023-11-13 DIAGNOSIS — I10 Essential (primary) hypertension: Secondary | ICD-10-CM

## 2023-11-14 ENCOUNTER — Telehealth: Payer: Self-pay

## 2023-11-14 NOTE — Telephone Encounter (Signed)
 Completed PA on [email or web address] for Victoza.  (KeyDemetrios Isaacs) PA Case ID #: [id number]  Awaiting outcome.

## 2023-11-15 ENCOUNTER — Other Ambulatory Visit: Payer: Self-pay

## 2023-11-15 MED ORDER — OZEMPIC (0.25 OR 0.5 MG/DOSE) 2 MG/3ML ~~LOC~~ SOPN: 0.2500 mg | PEN_INJECTOR | SUBCUTANEOUS | 0 refills | Status: AC

## 2023-11-15 NOTE — Telephone Encounter (Signed)
 PA was Denied per insurance.  Provider informed and wants to change patient to Ozempic.

## 2023-12-01 ENCOUNTER — Other Ambulatory Visit: Payer: Self-pay | Admitting: Family Medicine

## 2023-12-01 DIAGNOSIS — E782 Mixed hyperlipidemia: Secondary | ICD-10-CM

## 2023-12-03 ENCOUNTER — Other Ambulatory Visit: Payer: Self-pay | Admitting: Family Medicine

## 2023-12-03 DIAGNOSIS — K219 Gastro-esophageal reflux disease without esophagitis: Secondary | ICD-10-CM

## 2023-12-10 ENCOUNTER — Other Ambulatory Visit: Payer: Self-pay | Admitting: Family Medicine

## 2024-02-13 ENCOUNTER — Other Ambulatory Visit: Payer: Self-pay | Admitting: Family Medicine

## 2024-02-13 DIAGNOSIS — E039 Hypothyroidism, unspecified: Secondary | ICD-10-CM

## 2024-06-07 ENCOUNTER — Telehealth: Payer: Self-pay

## 2024-06-07 NOTE — Telephone Encounter (Signed)
 Patient needs TOC with other provider to refill medications.

## 2024-08-20 DEATH — deceased
